# Patient Record
Sex: Female | Born: 1975 | Race: White | Hispanic: No | Smoking: Former smoker
Health system: Southern US, Community
[De-identification: ages and names within clinical notes are randomized; demographics above are authoritative.]

## PROBLEM LIST (undated history)

## (undated) DIAGNOSIS — F32A Depression, unspecified: Secondary | ICD-10-CM

## (undated) DIAGNOSIS — IMO0002 Reserved for concepts with insufficient information to code with codable children: Secondary | ICD-10-CM

## (undated) DIAGNOSIS — J301 Allergic rhinitis due to pollen: Secondary | ICD-10-CM

## (undated) DIAGNOSIS — M5126 Other intervertebral disc displacement, lumbar region: Secondary | ICD-10-CM

## (undated) DIAGNOSIS — K760 Fatty (change of) liver, not elsewhere classified: Secondary | ICD-10-CM

## (undated) DIAGNOSIS — I6529 Occlusion and stenosis of unspecified carotid artery: Secondary | ICD-10-CM

## (undated) DIAGNOSIS — M1990 Unspecified osteoarthritis, unspecified site: Secondary | ICD-10-CM

## (undated) DIAGNOSIS — R569 Unspecified convulsions: Secondary | ICD-10-CM

## (undated) DIAGNOSIS — G8929 Other chronic pain: Secondary | ICD-10-CM

## (undated) DIAGNOSIS — I639 Cerebral infarction, unspecified: Secondary | ICD-10-CM

## (undated) DIAGNOSIS — R112 Nausea with vomiting, unspecified: Secondary | ICD-10-CM

## (undated) DIAGNOSIS — F319 Bipolar disorder, unspecified: Secondary | ICD-10-CM

## (undated) DIAGNOSIS — M549 Dorsalgia, unspecified: Secondary | ICD-10-CM

## (undated) DIAGNOSIS — R768 Other specified abnormal immunological findings in serum: Secondary | ICD-10-CM

## (undated) DIAGNOSIS — F329 Major depressive disorder, single episode, unspecified: Secondary | ICD-10-CM

## (undated) DIAGNOSIS — C9590 Leukemia, unspecified not having achieved remission: Secondary | ICD-10-CM

## (undated) DIAGNOSIS — R7303 Prediabetes: Secondary | ICD-10-CM

## (undated) DIAGNOSIS — E785 Hyperlipidemia, unspecified: Secondary | ICD-10-CM

## (undated) DIAGNOSIS — M797 Fibromyalgia: Secondary | ICD-10-CM

## (undated) DIAGNOSIS — J45909 Unspecified asthma, uncomplicated: Secondary | ICD-10-CM

## (undated) DIAGNOSIS — G629 Polyneuropathy, unspecified: Secondary | ICD-10-CM

## (undated) DIAGNOSIS — C539 Malignant neoplasm of cervix uteri, unspecified: Secondary | ICD-10-CM

## (undated) DIAGNOSIS — Z972 Presence of dental prosthetic device (complete) (partial): Secondary | ICD-10-CM

## (undated) DIAGNOSIS — Z973 Presence of spectacles and contact lenses: Secondary | ICD-10-CM

## (undated) DIAGNOSIS — I219 Acute myocardial infarction, unspecified: Secondary | ICD-10-CM

## (undated) DIAGNOSIS — I319 Disease of pericardium, unspecified: Secondary | ICD-10-CM

## (undated) DIAGNOSIS — Z9889 Other specified postprocedural states: Secondary | ICD-10-CM

## (undated) DIAGNOSIS — S0285XA Fracture of orbit, unspecified, initial encounter for closed fracture: Secondary | ICD-10-CM

## (undated) DIAGNOSIS — K219 Gastro-esophageal reflux disease without esophagitis: Secondary | ICD-10-CM

## (undated) DIAGNOSIS — F419 Anxiety disorder, unspecified: Secondary | ICD-10-CM

## (undated) DIAGNOSIS — M5020 Other cervical disc displacement, unspecified cervical region: Secondary | ICD-10-CM

## (undated) DIAGNOSIS — T148XXA Other injury of unspecified body region, initial encounter: Secondary | ICD-10-CM

## (undated) DIAGNOSIS — G971 Other reaction to spinal and lumbar puncture: Secondary | ICD-10-CM

## (undated) DIAGNOSIS — T884XXA Failed or difficult intubation, initial encounter: Secondary | ICD-10-CM

## (undated) DIAGNOSIS — R9409 Abnormal results of other function studies of central nervous system: Secondary | ICD-10-CM

## (undated) HISTORY — PX: BONE MARROW TRANSPLANT: SHX200

## (undated) HISTORY — DX: Other specified abnormal immunological findings in serum: R76.8

## (undated) HISTORY — DX: Malignant neoplasm of cervix uteri, unspecified: C53.9

## (undated) HISTORY — PX: DILATION AND CURETTAGE OF UTERUS: SHX78

## (undated) HISTORY — DX: Abnormal results of other function studies of central nervous system: R94.09

## (undated) HISTORY — PX: MULTIPLE TOOTH EXTRACTIONS: SHX2053

## (undated) HISTORY — PX: INTRAUTERINE DEVICE INSERTION: SHX323

## (undated) HISTORY — DX: Leukemia, unspecified not having achieved remission: C95.90

## (undated) HISTORY — PX: CHOLECYSTECTOMY: SHX55

## (undated) HISTORY — DX: Allergic rhinitis due to pollen: J30.1

## (undated) HISTORY — PX: NOSE SURGERY: SHX723

---

## 2006-11-01 ENCOUNTER — Emergency Department: Payer: Self-pay | Admitting: Emergency Medicine

## 2006-11-01 ENCOUNTER — Other Ambulatory Visit: Payer: Self-pay

## 2009-06-13 ENCOUNTER — Ambulatory Visit (HOSPITAL_COMMUNITY): Admission: RE | Admit: 2009-06-13 | Discharge: 2009-06-13 | Payer: Self-pay | Admitting: Obstetrics & Gynecology

## 2009-08-26 DIAGNOSIS — S139XXA Sprain of joints and ligaments of unspecified parts of neck, initial encounter: Secondary | ICD-10-CM | POA: Insufficient documentation

## 2009-08-26 DIAGNOSIS — Z9889 Other specified postprocedural states: Secondary | ICD-10-CM | POA: Insufficient documentation

## 2009-08-26 DIAGNOSIS — M542 Cervicalgia: Secondary | ICD-10-CM | POA: Insufficient documentation

## 2009-08-26 DIAGNOSIS — C9190 Lymphoid leukemia, unspecified not having achieved remission: Secondary | ICD-10-CM | POA: Insufficient documentation

## 2009-08-26 DIAGNOSIS — F32A Depression, unspecified: Secondary | ICD-10-CM | POA: Diagnosis present

## 2009-11-11 ENCOUNTER — Inpatient Hospital Stay: Payer: Self-pay | Admitting: Psychiatry

## 2010-02-24 ENCOUNTER — Ambulatory Visit: Payer: Self-pay | Admitting: Surgery

## 2010-02-25 ENCOUNTER — Ambulatory Visit: Payer: Self-pay | Admitting: Surgery

## 2010-03-01 LAB — PATHOLOGY REPORT

## 2010-04-19 HISTORY — PX: BACK SURGERY: SHX140

## 2011-02-13 ENCOUNTER — Emergency Department (HOSPITAL_COMMUNITY)
Admission: EM | Admit: 2011-02-13 | Discharge: 2011-02-13 | Disposition: A | Discharge status: Home / Self Care | Payer: Medicaid Other | Attending: Emergency Medicine | Admitting: Emergency Medicine

## 2011-02-13 ENCOUNTER — Encounter: Payer: Self-pay | Admitting: *Deleted

## 2011-02-13 ENCOUNTER — Emergency Department (HOSPITAL_COMMUNITY): Payer: Medicaid Other

## 2011-02-13 DIAGNOSIS — W010XXA Fall on same level from slipping, tripping and stumbling without subsequent striking against object, initial encounter: Secondary | ICD-10-CM | POA: Insufficient documentation

## 2011-02-13 DIAGNOSIS — F17200 Nicotine dependence, unspecified, uncomplicated: Secondary | ICD-10-CM | POA: Insufficient documentation

## 2011-02-13 DIAGNOSIS — M549 Dorsalgia, unspecified: Secondary | ICD-10-CM | POA: Insufficient documentation

## 2011-02-13 DIAGNOSIS — T07XXXA Unspecified multiple injuries, initial encounter: Secondary | ICD-10-CM | POA: Insufficient documentation

## 2011-02-13 DIAGNOSIS — R071 Chest pain on breathing: Secondary | ICD-10-CM | POA: Insufficient documentation

## 2011-02-13 DIAGNOSIS — Y92009 Unspecified place in unspecified non-institutional (private) residence as the place of occurrence of the external cause: Secondary | ICD-10-CM | POA: Insufficient documentation

## 2011-02-13 HISTORY — DX: Unspecified convulsions: R56.9

## 2011-02-13 MED ORDER — OXYCODONE-ACETAMINOPHEN 5-325 MG PO TABS
1.0000 | ORAL_TABLET | Freq: Once | ORAL | Status: AC
  Administered 2011-02-13: 1 via ORAL
  Filled 2011-02-13: qty 1

## 2011-02-13 MED ORDER — OXYCODONE-ACETAMINOPHEN 5-325 MG PO TABS: 1.0000 | ORAL_TABLET | ORAL | Status: AC | PRN

## 2011-02-13 MED ORDER — DIAZEPAM 10 MG PO TABS: 10.0000 mg | ORAL_TABLET | Freq: Four times a day (QID) | ORAL | Status: AC | PRN

## 2011-02-13 NOTE — ED Notes (Signed)
Pt to xray

## 2011-02-13 NOTE — ED Notes (Signed)
Pt waiting to be seen by provider. NAD. Assisted pt to restroom by wheelchair.
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
## 2011-02-13 NOTE — ED Notes (Signed)
Pt stating she fell in the tub yesterday landing onto her lower right rib area. Pt describes pain in ribs increasing with inspiration no abnormalities palpated, pt stating low back pain after falling and radiating into right sciatic area.

## 2011-02-13 NOTE — ED Notes (Signed)
Pt c/o pain to chest, back, and left hip. Pt states she slipped and fell in her bath tub yesterday.

## 2011-06-02 ENCOUNTER — Encounter (HOSPITAL_COMMUNITY): Payer: Self-pay | Admitting: Emergency Medicine

## 2011-06-02 ENCOUNTER — Emergency Department (HOSPITAL_COMMUNITY)
Admission: EM | Admit: 2011-06-02 | Discharge: 2011-06-02 | Disposition: A | Discharge status: Home / Self Care | Payer: Medicaid Other | Attending: Emergency Medicine | Admitting: Emergency Medicine

## 2011-06-02 DIAGNOSIS — F17200 Nicotine dependence, unspecified, uncomplicated: Secondary | ICD-10-CM | POA: Insufficient documentation

## 2011-06-02 DIAGNOSIS — F341 Dysthymic disorder: Secondary | ICD-10-CM | POA: Insufficient documentation

## 2011-06-02 DIAGNOSIS — G8929 Other chronic pain: Secondary | ICD-10-CM | POA: Insufficient documentation

## 2011-06-02 DIAGNOSIS — M549 Dorsalgia, unspecified: Secondary | ICD-10-CM | POA: Insufficient documentation

## 2011-06-02 HISTORY — DX: Anxiety disorder, unspecified: F41.9

## 2011-06-02 HISTORY — DX: Major depressive disorder, single episode, unspecified: F32.9

## 2011-06-02 HISTORY — DX: Depression, unspecified: F32.A

## 2011-06-02 MED ORDER — ONDANSETRON HCL 4 MG PO TABS
4.0000 mg | ORAL_TABLET | Freq: Once | ORAL | Status: AC
  Administered 2011-06-02: 4 mg via ORAL
  Filled 2011-06-02: qty 1

## 2011-06-02 MED ORDER — METHOCARBAMOL 500 MG PO TABS
1000.0000 mg | ORAL_TABLET | Freq: Once | ORAL | Status: AC
  Administered 2011-06-02: 1000 mg via ORAL
  Filled 2011-06-02: qty 2

## 2011-06-02 MED ORDER — HYDROCODONE-ACETAMINOPHEN 7.5-325 MG PO TABS: 1.0000 | ORAL_TABLET | ORAL | Status: AC | PRN

## 2011-06-02 MED ORDER — DEXAMETHASONE SODIUM PHOSPHATE 4 MG/ML IJ SOLN
8.0000 mg | Freq: Once | INTRAMUSCULAR | Status: AC
  Administered 2011-06-02: 8 mg via INTRAMUSCULAR
  Filled 2011-06-02: qty 2

## 2011-06-02 MED ORDER — FENTANYL CITRATE 0.05 MG/ML IJ SOLN: 50.0000 ug | Freq: Once | INTRAMUSCULAR | Status: DC

## 2011-06-02 MED ORDER — DEXAMETHASONE 6 MG PO TABS: ORAL_TABLET | ORAL | Status: AC

## 2011-06-02 MED ORDER — METHOCARBAMOL 500 MG PO TABS: ORAL_TABLET | ORAL | Status: DC

## 2011-06-02 MED ORDER — FENTANYL CITRATE 0.05 MG/ML IJ SOLN
50.0000 ug | Freq: Once | INTRAMUSCULAR | Status: AC
  Administered 2011-06-02: 20:00:00 via INTRAVENOUS
  Filled 2011-06-02: qty 2
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
## 2011-06-02 NOTE — ED Provider Notes (Signed)
Medical screening examination/treatment/procedure(s) were performed by non-physician practitioner and as supervising physician I was immediately available for consultation/collaboration.  Nicoletta Dress. Colon Branch, MD 06/02/11 2201

## 2011-06-02 NOTE — Discharge Instructions (Signed)
Please see Dr Eduard Clos for assistance with your pain. Check with Dr Phillips Odor [phone] for possible primary care.. Decadron 2 times daily with food. Robaxin three times daily for spasm. Use Norco if needed for pain. Robaxin and Norco may cause drowsiness, use with caution.

## 2011-06-02 NOTE — ED Notes (Signed)
Pain low back and lt leg for 4 mos s/p fall.  Seen here for  Same and xrays done,  And by her md.  Has had PT also without relief.

## 2011-06-02 NOTE — ED Notes (Signed)
Pt c/o intermittent back pain radiating down left leg x several months. Pt states she used a chainsaw last week and pain has been worse ever since.

## 2011-06-03 ENCOUNTER — Ambulatory Visit (HOSPITAL_COMMUNITY)
Admission: RE | Admit: 2011-06-03 | Discharge: 2011-06-03 | Disposition: A | Discharge status: Home / Self Care | Payer: Medicaid Other | Source: Ambulatory Visit | Attending: Nurse Practitioner | Admitting: Nurse Practitioner

## 2011-06-03 ENCOUNTER — Other Ambulatory Visit (HOSPITAL_COMMUNITY): Payer: Self-pay | Admitting: Nurse Practitioner

## 2011-06-03 DIAGNOSIS — M25559 Pain in unspecified hip: Secondary | ICD-10-CM | POA: Insufficient documentation

## 2011-06-03 DIAGNOSIS — M4800 Spinal stenosis, site unspecified: Secondary | ICD-10-CM

## 2011-06-03 DIAGNOSIS — M5126 Other intervertebral disc displacement, lumbar region: Secondary | ICD-10-CM | POA: Insufficient documentation

## 2011-06-03 DIAGNOSIS — M7130 Other bursal cyst, unspecified site: Secondary | ICD-10-CM | POA: Insufficient documentation

## 2011-06-03 DIAGNOSIS — M545 Low back pain, unspecified: Secondary | ICD-10-CM | POA: Insufficient documentation

## 2011-06-03 DIAGNOSIS — M79609 Pain in unspecified limb: Secondary | ICD-10-CM | POA: Insufficient documentation

## 2011-06-17 DIAGNOSIS — M48061 Spinal stenosis, lumbar region without neurogenic claudication: Secondary | ICD-10-CM | POA: Insufficient documentation

## 2011-06-19 ENCOUNTER — Encounter (HOSPITAL_COMMUNITY): Payer: Self-pay | Admitting: *Deleted

## 2011-06-19 ENCOUNTER — Emergency Department (HOSPITAL_COMMUNITY)
Admission: EM | Admit: 2011-06-19 | Discharge: 2011-06-19 | Disposition: A | Discharge status: Home / Self Care | Payer: Medicaid Other | Attending: Emergency Medicine | Admitting: Emergency Medicine

## 2011-06-19 DIAGNOSIS — F341 Dysthymic disorder: Secondary | ICD-10-CM | POA: Insufficient documentation

## 2011-06-19 DIAGNOSIS — M48061 Spinal stenosis, lumbar region without neurogenic claudication: Secondary | ICD-10-CM | POA: Insufficient documentation

## 2011-06-19 DIAGNOSIS — M545 Low back pain, unspecified: Secondary | ICD-10-CM | POA: Insufficient documentation

## 2011-06-19 DIAGNOSIS — Z79899 Other long term (current) drug therapy: Secondary | ICD-10-CM | POA: Insufficient documentation

## 2011-06-19 DIAGNOSIS — G40909 Epilepsy, unspecified, not intractable, without status epilepticus: Secondary | ICD-10-CM | POA: Insufficient documentation

## 2011-06-19 DIAGNOSIS — M5380 Other specified dorsopathies, site unspecified: Secondary | ICD-10-CM | POA: Insufficient documentation

## 2011-06-19 DIAGNOSIS — Z8541 Personal history of malignant neoplasm of cervix uteri: Secondary | ICD-10-CM | POA: Insufficient documentation

## 2011-06-19 DIAGNOSIS — G8929 Other chronic pain: Secondary | ICD-10-CM | POA: Insufficient documentation

## 2011-06-19 HISTORY — DX: Reserved for concepts with insufficient information to code with codable children: IMO0002

## 2011-06-19 HISTORY — DX: Dorsalgia, unspecified: M54.9

## 2011-06-19 HISTORY — DX: Other chronic pain: G89.29

## 2011-06-19 MED ORDER — HYDROMORPHONE HCL PF 1 MG/ML IJ SOLN: 1.0000 mg | Freq: Once | INTRAMUSCULAR | Status: DC

## 2011-06-19 MED ORDER — DEXAMETHASONE 6 MG PO TABS: ORAL_TABLET | ORAL | Status: AC

## 2011-06-19 MED ORDER — METHOCARBAMOL 500 MG PO TABS: ORAL_TABLET | ORAL | Status: DC

## 2011-06-19 MED ORDER — FENTANYL CITRATE 0.05 MG/ML IJ SOLN
50.0000 ug | Freq: Once | INTRAMUSCULAR | Status: AC
  Administered 2011-06-19: 50 ug via INTRAVENOUS
  Filled 2011-06-19: qty 2

## 2011-06-19 MED ORDER — ONDANSETRON HCL 4 MG/2ML IJ SOLN
4.0000 mg | Freq: Once | INTRAMUSCULAR | Status: AC
  Administered 2011-06-19: 4 mg via INTRAVENOUS
  Filled 2011-06-19: qty 2

## 2011-06-19 MED ORDER — DIAZEPAM 5 MG PO TABS
5.0000 mg | ORAL_TABLET | Freq: Once | ORAL | Status: AC
  Administered 2011-06-19: 5 mg via ORAL
  Filled 2011-06-19: qty 1

## 2011-06-19 MED ORDER — HYDROCODONE-ACETAMINOPHEN 7.5-325 MG PO TABS: 1.0000 | ORAL_TABLET | ORAL | Status: AC | PRN

## 2011-06-19 MED ORDER — DEXAMETHASONE SODIUM PHOSPHATE 4 MG/ML IJ SOLN
10.0000 mg | Freq: Once | INTRAMUSCULAR | Status: AC
  Administered 2011-06-19: 10 mg via INTRAMUSCULAR
  Filled 2011-06-19: qty 3

## 2011-06-19 MED ORDER — HYDROMORPHONE HCL PF 1 MG/ML IJ SOLN
1.0000 mg | Freq: Once | INTRAMUSCULAR | Status: AC
  Administered 2011-06-19: 1 mg via INTRAVENOUS
  Filled 2011-06-19: qty 1

## 2011-06-19 MED ORDER — SODIUM CHLORIDE 0.9 % IV SOLN
Freq: Once | INTRAVENOUS | Status: AC
  Administered 2011-06-19: 20:00:00 via INTRAVENOUS

## 2011-06-19 NOTE — ED Notes (Signed)
Pt presents with chronic low back pain x 3 weeks. Pt states she has been seen by MD and had MRI on Monday. Pt lying on side in gurney. Pt visibly in pain. Family with pt. Will continue to monitor.

## 2011-06-19 NOTE — ED Provider Notes (Signed)
History     CSN: [id number]  Arrival date & time 06/19/11  1654   First MD Initiated Contact with Patient 06/19/11 1847      Chief Complaint  Patient presents with  . Back Pain    (Consider location/radiation/quality/duration/timing/severity/associated sxs/prior treatment) Patient is a 36 y.o. female presenting with back pain. The history is provided by the patient.  Back Pain  This is a recurrent problem. The problem occurs hourly. The problem has been gradually worsening. Associated with: 2 herniated disc. The pain is present in the lumbar spine. The quality of the pain is described as aching.    Past Medical History  Diagnosis Date  . Anxiety   . Depression   . Cancer     cervical CA/ Lukemia  . Seizures     last seizure was 8 mo ago.   Marland Kitchen Herniated disc   . Chronic back pain     Past Surgical History  Procedure Date  . Cholecystectomy   . Cesarean section   . Bone marrow transplant     No family history on file.  History  Substance Use Topics  . Smoking status: Current Everyday Smoker -- 0.5 packs/day  . Smokeless tobacco: Not on file  . Alcohol Use: No    OB History    Grav Para Term Preterm Abortions TAB SAB Ect Mult Living                  Review of Systems  Musculoskeletal: Positive for back pain.    Allergies  Review of patient's allergies indicates no known allergies.  Home Medications   Current Outpatient Rx  Name Route Sig Dispense Refill  . CETIRIZINE HCL 10 MG PO TABS Oral Take 10 mg by mouth daily.      Marland Kitchen CLONAZEPAM 0.5 MG PO TABS Oral Take 0.5 mg by mouth 4 (four) times daily.      . DULOXETINE HCL 30 MG PO CPEP Oral Take 30-60 mg by mouth 2 (two) times daily. Take two capsules daily in the morning and one capsule at bedtime    . IBUPROFEN 200 MG PO TABS Oral Take 200 mg by mouth as needed. For pain    . METHOCARBAMOL 500 MG PO TABS  2 tabs po tid for spasm 30 tablet 0  . OXCARBAZEPINE 150 MG PO TABS Oral Take 150 mg by mouth at  bedtime.      Marland Kitchen TEMAZEPAM 30 MG PO CAPS Oral Take 30 mg by mouth at bedtime.        BP 98/57  Pulse 96  Temp(Src) 98.3 F (36.8 C) (Oral)  Resp 22  Ht 5\' 5"  (1.651 m)  Wt 125 lb (56.7 kg)  BMI 20.80 kg/m2  SpO2 98%  LMP 06/02/2011  Physical Exam  Nursing note and vitals reviewed. Constitutional: She is oriented to person, place, and time. She appears well-developed and well-nourished.  Non-toxic appearance.  HENT:  Head: Normocephalic.  Right Ear: Tympanic membrane and external ear normal.  Left Ear: Tympanic membrane and external ear normal.  Eyes: EOM and lids are normal. Pupils are equal, round, and reactive to light.  Neck: Normal range of motion. Neck supple. Carotid bruit is not present.  Cardiovascular: Normal rate, regular rhythm, normal heart sounds, intact distal pulses and normal pulses.   Pulmonary/Chest: Breath sounds normal. No respiratory distress.  Abdominal: Soft. Bowel sounds are normal. There is no tenderness. There is no guarding.  Musculoskeletal: Normal range of motion.  Lumbar area pain to palpation and any movement of the lower back. Palpable spasm.  Lymphadenopathy:       Head (right side): No submandibular adenopathy present.       Head (left side): No submandibular adenopathy present.    She has no cervical adenopathy.  Neurological: She is alert and oriented to person, place, and time. She has normal strength. No cranial nerve deficit or sensory deficit.  Skin: Skin is warm and dry.  Psychiatric: Her speech is normal.       Tearful and anxious.    ED Course  Procedures (including critical care time) Pulse ox 98% on room air. WNL by my interpretation Labs Reviewed - No data to display No results found.   No diagnosis found.    MDM  I have reviewed nursing notes, vital signs, and all appropriate lab and imaging results for this patient. Having problems with low back pain for quite some time. She is seen by a primary care physician in  [place]. She received steroid injections in the back this week and has an MRI this week. She was treated with  Oxycodone, but this medication makes her "talk out of her head", per family. Pt moved to the acute area. Case discussed with Dr Estell Harpin. IV pain meds ordered and IV steroids ordered.  The patient states that the IV fentanyl was not effective in helping her pain. IV Dilaudid given. Pain improved after IV dilaudid. Pt ambulated to bath room with only minimal assistance. Plan is for pt to see her primary MD for re-evaluation of her pain meds. Rx for Norco 7.5, decadron, and Robaxin 500mg  given to pt.      Kathie Dike, [place] 06/19/11 2226

## 2011-06-19 NOTE — ED Notes (Signed)
Pt sitting up, talking on the phone, laughing.  Reporting improvement in pain level.
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
## 2011-06-19 NOTE — ED Notes (Signed)
Pt reports she has 2 herniated discs.  Reports pain has increased in past 2 days.  Reports that she did receive an injection in her back 2 days ago and has not had any relief.  Reports pain shooting down left leg.  Denies numbness in extremities.

## 2011-06-19 NOTE — Discharge Instructions (Signed)
Please apply heating pad to the lower back. Use prescribed medications as suggested. Please see your MD on March 4 for assistance with your pain. Vicodin and Robaxin may cause drowsiness, use with caution.Spinal Stenosis One cause of back pain is spinal stenosis. Stenosis means abnormal narrowing. The spinal canal contains and protects the spinal nerve roots. In spinal stenosis, the spinal canal narrows and pinches the spinal cord and nerves. This causes low back pain and pain in the legs. Stenosis may pinch the nerves that control muscles and sensation in the legs. This leads to pain and abnormal feelings in the leg muscles and areas supplied by those nerves. CAUSES  Spinal stenosis often happens to people as they get older and arthritic boney growths occur in their spinal canal. There is also a loss of the disk height between the bones of the back, which also adds to this problem. Sometimes the problem is present at birth. SYMPTOMS   Pain that is generally worse with activities, particularly standing and walking.   Numbness, tingling, hot or cold feelings, weakness, or a weariness in the legs.   Clumsiness, frequent falling, and a foot-slapping gait, which may come as a result of nerve pressure and muscle weakness.  DIAGNOSIS   Your caregiver may suspect spinal stenosis if you have unusual leg symptoms, such as those previously mentioned.   Your orthopedic surgeon may request special imaging exams, such a computerized magnetic scan (MRI) or computerized X-ray scan (CT) to find out the cause of the problem.  TREATMENT   Sometimes treatments such as postural changes or nonsteroidal anti-inflammatory drugs will relieve the pain.   Nonsteroidal anti-inflammatory medications may help relieve symptoms. These medicines do this by decreasing swelling and inflammation in the nerves.   When stenosis causes severe nerve root compression, conservative treatment may not be enough to maintain a normal  lifestyle. Surgery may be recommended to relieve the pressure on affected nerves. In properly selected patients, the results are very good, and patients are able to continue a normal lifestyle.  HOME CARE INSTRUCTIONS   Flexing the spine by leaning forward while walking may relieve symptoms. Lying with the knees drawn up to the chest may offer some relief. These positions enlarge the space available to the nerves. They may make it easier for stenosis sufferers to walk longer distances.   Rest, followed by gradually resuming activity, also can help.   Aerobic activity, such as bicycling or swimming, is often recommended.   Losing weight can also relieve some of the load on the spine.   Application of warm or cold compresses to the area of pain can be helpful.  SEEK MEDICAL CARE IF:   The periods of relief between episodes of pain become shorter and shorter.   You experience pain that radiates down your leg, even when you are not standing or walking.  SEEK IMMEDIATE MEDICAL CARE IF:   You have a loss of bowel or bladder control.   You have a sudden loss of feeling in your legs.   You suddenly cannot move your legs.  Document Released: 06/26/2003 Document Revised: 12/16/2010 Document Reviewed: 08/21/2009 [facility] Patient Information 2012 [place], [place].

## 2011-06-19 NOTE — ED Notes (Addendum)
Pt states that she has not had any relief from Fentanyl. Pt laying on side, screaming and restless in pain.  Discussed with PA.  Order received for additional pain medication.

## 2011-06-20 NOTE — ED Provider Notes (Signed)
Medical screening examination/treatment/procedure(s) were performed by non-physician practitioner and as supervising physician I was immediately available for consultation/collaboration.   Benny Lennert, MD 06/20/11 1118

## 2011-06-29 HISTORY — PX: BACK SURGERY: SHX140

## 2011-09-08 ENCOUNTER — Encounter (HOSPITAL_COMMUNITY): Payer: Self-pay | Admitting: Oncology

## 2011-09-08 ENCOUNTER — Encounter (HOSPITAL_COMMUNITY): Payer: Medicaid Other | Attending: Oncology | Admitting: Oncology

## 2011-09-08 VITALS — BP 103/72 | HR 86 | Temp 98.7°F | Ht 65.0 in | Wt 117.2 lb

## 2011-09-08 DIAGNOSIS — Z856 Personal history of leukemia: Secondary | ICD-10-CM | POA: Insufficient documentation

## 2011-09-08 DIAGNOSIS — C9102 Acute lymphoblastic leukemia, in relapse: Secondary | ICD-10-CM

## 2011-09-08 DIAGNOSIS — M79609 Pain in unspecified limb: Secondary | ICD-10-CM | POA: Insufficient documentation

## 2011-09-08 DIAGNOSIS — D72829 Elevated white blood cell count, unspecified: Secondary | ICD-10-CM | POA: Insufficient documentation

## 2011-09-08 DIAGNOSIS — F17200 Nicotine dependence, unspecified, uncomplicated: Secondary | ICD-10-CM | POA: Insufficient documentation

## 2011-09-08 NOTE — Progress Notes (Signed)
This office note has been dictated.

## 2011-09-08 NOTE — Patient Instructions (Signed)
Lauren Cross  [id number] 1975-09-01 Dr. Glenford Peers   [facility]  Discharge Instructions  RECOMMENDATIONS MADE BY THE CONSULTANT AND ANY TEST RESULTS WILL BE SENT TO YOUR REFERRING DOCTOR.   EXAM FINDINGS BY MD TODAY AND SIGNS AND SYMPTOMS TO REPORT TO CLINIC OR PRIMARY MD: Exam and discussion per MD.  Will check labs tomorrow to see your status.  MD is unable to feel any lymph nodes or your spleen which is good.  If any problems with your labs we will contact you.  MEDICATIONS PRESCRIBED: none   INSTRUCTIONS GIVEN AND DISCUSSED: Other :  Report any new lumps, increased shortness of breath,etc.  SPECIAL INSTRUCTIONS/FOLLOW-UP: Lab work Needed tomorrow and Return to Clinic on in 1 month to see PA.   I acknowledge that I have been informed and understand all the instructions given to me and received a copy. I do not have any more questions at this time, but understand that I may call the Specialty [facility] at [facility] at [phone] during business hours should I have any further questions or need assistance in obtaining follow-up care.    __________________________________________  _____________  __________ Signature of Patient or Authorized Representative            Date                   Time    __________________________________________ Nurse's Signature

## 2011-09-08 NOTE — Progress Notes (Signed)
CC:   Ninfa Linden, FNP  DIAGNOSES: 1. Leukocytosis. 2. 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 3. History of pregnancies times 2.  She has an 38 year old son who is     alive and in good health and a 27-year-old daughter who has     significant health issues having been born weighing 12 ounces and     10 inches.  HISTORY OF PRESENT ILLNESS:  Lauren Cross also had disk surgery at [facility] in March which went well.  She states, though, she has not felt well for about a year just in general.  She does have night sweats now for 7-8 months and she describes soaking night sweats requiring change of her T-shirt and shorts that she wears occasionally and some time sheets.  She has not really lost weight.  Weight is stable.  No fevers, but she does not take her temperatures.  She basically takes care of her 72-year-old daughter.  She has 5 caregiver's who also come in and help during the week for therapy for her child.  She has not found any lumps anywhere.  She does have some aching in her legs, thighs, and bones of her lower legs ,she states.  She has no blood in her stools, no blood in her urine.  She does smoke 3-4 cigarettes a day but has been as high as a pack a day and started smoking about 15 years ago.  She was also treated for cervical dysplasia with what sounds like cryotherapy first and then conization surgery.  She did not breastfeed her children, but she started having mammography sometime in her 73s and she thinks that was at the recommendation of the doctors at [facility].  She saw Dr. Providence Crosby there, who is now retired, about 3 years ago.  She saw him last at that point in time and I do not think she has been back since, however.  She does not use drugs.  PHYSICAL EXAMINATION:  Vital Signs:  She is afebrile.  She is  certainly in no acute distress.  Weight is 117 pounds.  5 feet 5 inches tall.  She is thin.  Blood pressure 103/72, right arm, sitting position.  Pulse 80 and regular.  Respirations 16 and unlabored.  She describes a pain score of 8 basically in her legs.  Abdomen:  Soft and nontender without hepatosplenomegaly.  She also has pain in the left upper quadrant of the abdomen she states for the last couple months.  Nothing brings it on in particular.  She is not having nausea or vomiting.  Lungs:  Diminished breath sounds throughout, especially in the upper lobes.  Breast:  Exam is negative for any masses.  She has fibrocystic changes.  Heart: Regular rhythm and rate without murmur, rub, or gallop.  She is not in acute distress.  HEENT:  Teeth are in good repair.  Throat is clear. Pupils equally round and reactive to light.  She has normal facial symmetry.  She is alert and oriented.  She has no peripheral edema. Pulses 1 to 2+ and symmetrical.  I would like to get some baseline blood work on her, especially CBC and differential, and  peripheral blood for flow cytometry to begin with. If there is any question about what is going on, we may have to follow this up  with a bone marrow aspirate and biopsy.  We will see her back one way or the other in a few weeks.    ______________________________ Ladona Horns. Mariel Sleet, MD ESN/MEDQ  D:  09/08/2011  T:  09/08/2011  Job:  [id number]

## 2011-09-09 ENCOUNTER — Encounter (HOSPITAL_COMMUNITY): Payer: Medicaid Other

## 2011-09-09 DIAGNOSIS — C9102 Acute lymphoblastic leukemia, in relapse: Secondary | ICD-10-CM

## 2011-09-09 LAB — COMPREHENSIVE METABOLIC PANEL
Alkaline Phosphatase: 89 U/L (ref 39–117)
BUN: 5 mg/dL — ABNORMAL LOW (ref 6–23)
CO2: 27 mEq/L (ref 19–32)
Chloride: 106 mEq/L (ref 96–112)
Creatinine, Ser: 0.7 mg/dL (ref 0.50–1.10)
GFR calc Af Amer: >90 mL/min (ref >90)
GFR calc non Af Amer: >90 mL/min (ref >90)
Glucose, Bld: 82 mg/dL (ref 70–99)
Potassium: 3.2 mEq/L — ABNORMAL LOW (ref 3.5–5.1)
Total Bilirubin: 0.3 mg/dL (ref 0.3–1.2)

## 2011-09-09 LAB — DIFFERENTIAL
Basophils Relative: 0 % (ref 0–1)
Lymphs Abs: 2.1 10*3/uL (ref 0.7–4.0)
Monocytes Absolute: 0.9 10*3/uL (ref 0.1–1.0)
Monocytes Relative: 5 % (ref 3–12)
Neutro Abs: 15.6 10*3/uL — ABNORMAL HIGH (ref 1.7–7.7)
Neutrophils Relative %: 81 % — ABNORMAL HIGH (ref 43–77)

## 2011-09-09 LAB — CBC
HCT: 41.8 % (ref 36.0–46.0)
Hemoglobin: 13.6 g/dL (ref 12.0–15.0)
MCHC: 32.5 g/dL (ref 30.0–36.0)
RBC: 4.35 MIL/uL (ref 3.87–5.11)

## 2011-09-09 LAB — LIPID PANEL
Cholesterol: 211 mg/dL — ABNORMAL HIGH (ref 0–200)
Triglycerides: 200 mg/dL — ABNORMAL HIGH (ref <150)
VLDL: 40 mg/dL (ref 0–40)

## 2011-09-30 ENCOUNTER — Emergency Department (HOSPITAL_COMMUNITY)
Admission: EM | Admit: 2011-09-30 | Discharge: 2011-09-30 | Disposition: A | Discharge status: Home / Self Care | Payer: Medicaid Other | Attending: Emergency Medicine | Admitting: Emergency Medicine

## 2011-09-30 ENCOUNTER — Encounter (HOSPITAL_COMMUNITY): Payer: Self-pay

## 2011-09-30 DIAGNOSIS — Z856 Personal history of leukemia: Secondary | ICD-10-CM | POA: Insufficient documentation

## 2011-09-30 DIAGNOSIS — R197 Diarrhea, unspecified: Secondary | ICD-10-CM | POA: Insufficient documentation

## 2011-09-30 DIAGNOSIS — Z8541 Personal history of malignant neoplasm of cervix uteri: Secondary | ICD-10-CM | POA: Insufficient documentation

## 2011-09-30 DIAGNOSIS — F329 Major depressive disorder, single episode, unspecified: Secondary | ICD-10-CM | POA: Insufficient documentation

## 2011-09-30 DIAGNOSIS — R51 Headache: Secondary | ICD-10-CM | POA: Insufficient documentation

## 2011-09-30 DIAGNOSIS — K529 Noninfective gastroenteritis and colitis, unspecified: Secondary | ICD-10-CM

## 2011-09-30 DIAGNOSIS — F3289 Other specified depressive episodes: Secondary | ICD-10-CM | POA: Insufficient documentation

## 2011-09-30 DIAGNOSIS — G8929 Other chronic pain: Secondary | ICD-10-CM | POA: Insufficient documentation

## 2011-09-30 DIAGNOSIS — R079 Chest pain, unspecified: Secondary | ICD-10-CM | POA: Insufficient documentation

## 2011-09-30 DIAGNOSIS — F17200 Nicotine dependence, unspecified, uncomplicated: Secondary | ICD-10-CM | POA: Insufficient documentation

## 2011-09-30 DIAGNOSIS — F411 Generalized anxiety disorder: Secondary | ICD-10-CM | POA: Insufficient documentation

## 2011-09-30 DIAGNOSIS — R112 Nausea with vomiting, unspecified: Secondary | ICD-10-CM | POA: Insufficient documentation

## 2011-09-30 LAB — COMPREHENSIVE METABOLIC PANEL
ALT: 11 U/L (ref 0–35)
AST: 18 U/L (ref 0–37)
Albumin: 3.4 g/dL — ABNORMAL LOW (ref 3.5–5.2)
CO2: 22 mEq/L (ref 19–32)
Calcium: 9.2 mg/dL (ref 8.4–10.5)
Creatinine, Ser: 0.52 mg/dL (ref 0.50–1.10)
Sodium: 137 mEq/L (ref 135–145)

## 2011-09-30 LAB — CBC
HCT: 41.1 % (ref 36.0–46.0)
Hemoglobin: 13.8 g/dL (ref 12.0–15.0)
MCH: 30.7 pg (ref 26.0–34.0)
MCHC: 33.6 g/dL (ref 30.0–36.0)
MCV: 91.3 fL (ref 78.0–100.0)
RBC: 4.5 MIL/uL (ref 3.87–5.11)

## 2011-09-30 LAB — URINALYSIS, ROUTINE W REFLEX MICROSCOPIC
Bilirubin Urine: NEGATIVE
Glucose, UA: NEGATIVE mg/dL
Ketones, ur: 40 mg/dL — AB
Leukocytes, UA: NEGATIVE
pH: 6.5 (ref 5.0–8.0)

## 2011-09-30 LAB — DIFFERENTIAL
Basophils Relative: 0 % (ref 0–1)
Eosinophils Absolute: 0 10*3/uL (ref 0.0–0.7)
Eosinophils Relative: 0 % (ref 0–5)
Lymphs Abs: 2.4 10*3/uL (ref 0.7–4.0)
Monocytes Absolute: 0.7 10*3/uL (ref 0.1–1.0)
Monocytes Relative: 6 % (ref 3–12)

## 2011-09-30 LAB — URINE MICROSCOPIC-ADD ON

## 2011-09-30 MED ORDER — SODIUM CHLORIDE 0.9 % IV BOLUS (SEPSIS)
1000.0000 mL | Freq: Once | INTRAVENOUS | Status: AC
  Administered 2011-09-30: 1000 mL via INTRAVENOUS

## 2011-09-30 MED ORDER — ONDANSETRON HCL 4 MG/2ML IJ SOLN
4.0000 mg | Freq: Once | INTRAMUSCULAR | Status: AC
  Administered 2011-09-30: 4 mg via INTRAVENOUS
  Filled 2011-09-30: qty 2

## 2011-09-30 MED ORDER — PANTOPRAZOLE SODIUM 40 MG IV SOLR
40.0000 mg | Freq: Once | INTRAVENOUS | Status: AC
  Administered 2011-09-30: 40 mg via INTRAVENOUS
  Filled 2011-09-30: qty 40

## 2011-09-30 MED ORDER — DIPHENOXYLATE-ATROPINE 2.5-0.025 MG PO TABS: 1.0000 | ORAL_TABLET | Freq: Four times a day (QID) | ORAL | Status: AC | PRN

## 2011-09-30 MED ORDER — ONDANSETRON 4 MG PO TBDP
4.0000 mg | ORAL_TABLET | Freq: Once | ORAL | Status: AC
  Administered 2011-09-30: 4 mg via ORAL
  Filled 2011-09-30 (×2): qty 1

## 2011-09-30 MED ORDER — ONDANSETRON HCL 8 MG PO TABS: 8.0000 mg | ORAL_TABLET | ORAL | Status: AC | PRN

## 2011-09-30 MED ORDER — LORAZEPAM 2 MG/ML IJ SOLN
1.0000 mg | Freq: Once | INTRAMUSCULAR | Status: AC
  Administered 2011-09-30: 1 mg via INTRAVENOUS
  Filled 2011-09-30: qty 1

## 2011-09-30 MED ORDER — LORAZEPAM 2 MG/ML IJ SOLN
1.0000 mg | Freq: Once | INTRAMUSCULAR | Status: AC
  Administered 2011-09-30: 2 mg via INTRAVENOUS
  Filled 2011-09-30: qty 1

## 2011-09-30 MED ORDER — KETOROLAC TROMETHAMINE 30 MG/ML IJ SOLN
30.0000 mg | Freq: Once | INTRAMUSCULAR | Status: AC
  Administered 2011-09-30: 30 mg via INTRAVENOUS
  Filled 2011-09-30: qty 1

## 2011-09-30 MED ORDER — LORAZEPAM 1 MG PO TABS: 1.0000 mg | ORAL_TABLET | Freq: Three times a day (TID) | ORAL | Status: AC | PRN

## 2011-09-30 NOTE — ED Notes (Signed)
Pt here with multiple complaints. Pt c/o vomiting/diarrhea since Sunday along with headache. Pt seeing Dr. Jerelyn Scott for ALL which pt has been in remission for 1 year. Pt tearful in triage.

## 2011-09-30 NOTE — ED Provider Notes (Signed)
History   This chart was scribed for Lauren Hutching, MD by Clarita Crane. The patient was seen in room APA12/APA12. Patient's care was started at 1157.    CSN: [id number]  Arrival date & time 09/30/11  1157   First MD Initiated Contact with Patient 09/30/11 1430      Chief Complaint  Patient presents with  . Headache  . Diarrhea  . Emesis    (Consider location/radiation/quality/duration/timing/severity/associated sxs/prior treatment) HPI Lauren Cross is a 36 y.o. female who presents to the Emergency Department complaining of constant moderate to severe nausea, vomiting and diarrhea with associated HA and chest discomfort onset 3 days ago and persistent since. Denies SOB, palpitations, fever, chills, cough. Patient notes she started use of potassium supplements 3 weeks ago after dx with hypokalemia. Notes white blood cell count has been elevated for the past several months for which she is being followed by Dr. Mariel Sleet but has not been able to see Dr. Mariel Sleet recently. Patient with h/o ALL Leukemia which is currently in remission.  Neijstrom- Oncologist  Past Medical History  Diagnosis Date  . Anxiety   . Depression   . Cancer     cervical CA/ Lukemia  . Seizures     last seizure was 8 mo ago.   Marland Kitchen Herniated disc   . Chronic back pain     Past Surgical History  Procedure Date  . Cholecystectomy   . Cesarean section   . Bone marrow transplant   . Back surgery 06/29/11    L4-5 microdiskectomy  . Intrauterine device insertion     Family History  Problem Relation Age of Onset  . Diabetes Mother   . Cancer Mother     breast cancer  . Melanoma Father   . Diabetes Maternal Grandfather   . Stroke Maternal Grandfather     History  Substance Use Topics  . Smoking status: Current Everyday Smoker -- 0.2 packs/day  . Smokeless tobacco: Never Used  . Alcohol Use: No    OB History    Grav Para Term Preterm Abortions TAB SAB Ect Mult Living                  Review of  Systems A complete 10 system review of systems was obtained and all systems are negative except as noted in the HPI and PMH.   Allergies  Oxycodone  Home Medications   Current Outpatient Rx  Name Route Sig Dispense Refill  . CETIRIZINE HCL 10 MG PO TABS Oral Take 10 mg by mouth daily.      Marland Kitchen CLONAZEPAM 1 MG PO TABS Oral Take 1 mg by mouth 3 (three) times daily as needed. For anxiety    . DULOXETINE HCL 30 MG PO CPEP Oral Take 30 mg by mouth 2 (two) times daily. Take 2 capsules in am and 1 capsule in pm    . HYDROCODONE-ACETAMINOPHEN 7.5-325 MG PO TABS Oral Take 1 tablet by mouth every 8 (eight) hours as needed. For pain    . IBUPROFEN 200 MG PO TABS Oral Take 200 mg by mouth as needed. For pain    . LEVETIRACETAM 750 MG PO TABS Oral Take 750 mg by mouth every 12 (twelve) hours.    . OXCARBAZEPINE 150 MG PO TABS Oral Take 150 mg by mouth at bedtime.      Marland Kitchen POTASSIUM CHLORIDE CRYS ER 10 MEQ PO TBCR Oral Take 10 mEq by mouth daily.    Marland Kitchen TEMAZEPAM 30 MG PO CAPS  Oral Take 30 mg by mouth at bedtime as needed. For sleep    . TRAZODONE HCL 100 MG PO TABS Oral Take 100 mg by mouth at bedtime as needed. For sleep    . RANITIDINE HCL 75 MG PO TABS Oral Take 75 mg by mouth at bedtime.      BP 102/68  Pulse 92  Temp 98.8 F (37.1 C) (Oral)  Resp 16  Ht 5\' 5"  (1.651 m)  Wt 123 lb (55.792 kg)  BMI 20.47 kg/m2  SpO2 99%  Physical Exam  Nursing note and vitals reviewed. Constitutional: She is oriented to person, place, and time. She appears well-developed and well-nourished. No distress.  HENT:  Head: Normocephalic and atraumatic.  Eyes: EOM are normal. Pupils are equal, round, and reactive to light.  Neck: Neck supple. No tracheal deviation present.  Cardiovascular: Normal rate and regular rhythm.  Exam reveals no gallop and no friction rub.   No murmur heard. Pulmonary/Chest: Effort normal. No respiratory distress.  Abdominal: Soft. She exhibits no distension.  Musculoskeletal: Normal  range of motion. She exhibits no edema.  Neurological: She is alert and oriented to person, place, and time. No sensory deficit.  Skin: Skin is warm and dry.  Psychiatric: She has a normal mood and affect. Her behavior is normal.    ED Course  Procedures (including critical care time)  DIAGNOSTIC STUDIES: Oxygen Saturation is 100% on room air, normal by my interpretation.    COORDINATION OF CARE: 2:50PM-IVFs, antiemetics, blood labs, protonix, zofran, ativan to be obtained and administered.   Results for orders placed during the hospital encounter of 09/30/11  COMPREHENSIVE METABOLIC PANEL      Component Value Range   Sodium 137  135 - 145 mEq/L   Potassium 3.5  3.5 - 5.1 mEq/L   Chloride 102  96 - 112 mEq/L   CO2 22  19 - 32 mEq/L   Glucose, Bld 85  70 - 99 mg/dL   BUN 7  6 - 23 mg/dL   Creatinine, Ser 1.61  0.50 - 1.10 mg/dL   Calcium 9.2  8.4 - 09.6 mg/dL   Total Protein 7.0  6.0 - 8.3 g/dL   Albumin 3.4 (*) 3.5 - 5.2 g/dL   AST 18  0 - 37 U/L   ALT 11  0 - 35 U/L   Alkaline Phosphatase 95  39 - 117 U/L   Total Bilirubin 0.5  0.3 - 1.2 mg/dL   GFR calc non Af Amer >90  >90 mL/min   GFR calc Af Amer >90  >90 mL/min  CBC      Component Value Range   WBC 11.7 (*) 4.0 - 10.5 K/uL   RBC 4.50  3.87 - 5.11 MIL/uL   Hemoglobin 13.8  12.0 - 15.0 g/dL   HCT 04.5  40.9 - 81.1 %   MCV 91.3  78.0 - 100.0 fL   MCH 30.7  26.0 - 34.0 pg   MCHC 33.6  30.0 - 36.0 g/dL   RDW 91.4  78.2 - 95.6 %   Platelets 500 (*) 150 - 400 K/uL  DIFFERENTIAL      Component Value Range   Neutrophils Relative 73  43 - 77 %   Neutro Abs 8.6 (*) 1.7 - 7.7 K/uL   Lymphocytes Relative 20  12 - 46 %   Lymphs Abs 2.4  0.7 - 4.0 K/uL   Monocytes Relative 6  3 - 12 %   Monocytes Absolute 0.7  0.1 - 1.0 K/uL   Eosinophils Relative 0  0 - 5 %   Eosinophils Absolute 0.0  0.0 - 0.7 K/uL   Basophils Relative 0  0 - 1 %   Basophils Absolute 0.0  0.0 - 0.1 K/uL  LIPASE, BLOOD      Component Value Range    Lipase 24  11 - 59 U/L  URINALYSIS, ROUTINE W REFLEX MICROSCOPIC      Component Value Range   Color, Urine YELLOW  YELLOW   APPearance CLEAR  CLEAR   Specific Gravity, Urine 1.015  1.005 - 1.030   pH 6.5  5.0 - 8.0   Glucose, UA NEGATIVE  NEGATIVE mg/dL   Hgb urine dipstick MODERATE (*) NEGATIVE   Bilirubin Urine NEGATIVE  NEGATIVE   Ketones, ur 40 (*) NEGATIVE mg/dL   Protein, ur NEGATIVE  NEGATIVE mg/dL   Urobilinogen, UA 0.2  0.0 - 1.0 mg/dL   Nitrite NEGATIVE  NEGATIVE   Leukocytes, UA NEGATIVE  NEGATIVE  URINE MICROSCOPIC-ADD ON      Component Value Range   Squamous Epithelial / LPF FEW (*) RARE   WBC, UA 0-2  <3 WBC/hpf   RBC / HPF 3-6  <3 RBC/hpf   Bacteria, UA RARE  RARE   Labs Reviewed - No data to display No results found.   No diagnosis found.   Date: 09/30/2011  Rate: 91  Rhythm: normal sinus rhythm  QRS Axis: normal  Intervals: normal  ST/T Wave abnormalities: normal  Conduction Disutrbances: none  Narrative Interpretation: unremarkable     MDM  Recheck at d/c:  Feeling better.  No acute abd      Add scribe attestation statement}I personally performed the services described in this documentation, which was scribed in my presence. The recorded information has been reviewed and considered.   Lauren Hutching, MD 09/30/11 [phone]

## 2011-09-30 NOTE — ED Notes (Signed)
PT with recent lower back surgery at [facility] in March, pt states that her [facility] have been elevated and sees Dr. Demetrios Isaacs for ALL, pt states last seen in May and not able to get answers why WBC's are elevated, also c/o diarrhea and N/V since past Sunday, denies any pain

## 2011-09-30 NOTE — Discharge Instructions (Signed)
White blood cell count was 11.7.  meds for nausea, diarrhea, anxiety.  Clear liquids.  F/u your dr in in aZaditor drops

## 2011-09-30 NOTE — ED Notes (Signed)
Pt ambulated to room without difficultly

## 2011-10-04 ENCOUNTER — Encounter (HOSPITAL_COMMUNITY): Payer: Medicaid Other | Attending: Oncology

## 2011-10-04 DIAGNOSIS — M79609 Pain in unspecified limb: Secondary | ICD-10-CM | POA: Insufficient documentation

## 2011-10-04 DIAGNOSIS — D72829 Elevated white blood cell count, unspecified: Secondary | ICD-10-CM | POA: Insufficient documentation

## 2011-10-04 DIAGNOSIS — Z856 Personal history of leukemia: Secondary | ICD-10-CM | POA: Insufficient documentation

## 2011-10-04 DIAGNOSIS — F17200 Nicotine dependence, unspecified, uncomplicated: Secondary | ICD-10-CM | POA: Insufficient documentation

## 2011-10-04 NOTE — Progress Notes (Signed)
Labs drawn today for flow cytomerty

## 2011-10-06 ENCOUNTER — Ambulatory Visit (HOSPITAL_COMMUNITY): Payer: Medicaid Other | Admitting: Oncology

## 2011-10-06 ENCOUNTER — Encounter (HOSPITAL_COMMUNITY): Payer: Self-pay | Admitting: Oncology

## 2011-10-11 ENCOUNTER — Ambulatory Visit (HOSPITAL_COMMUNITY): Payer: Medicaid Other | Admitting: Oncology

## 2011-11-17 ENCOUNTER — Other Ambulatory Visit (HOSPITAL_COMMUNITY): Payer: Self-pay | Admitting: Nurse Practitioner

## 2011-11-17 ENCOUNTER — Ambulatory Visit (HOSPITAL_COMMUNITY)
Admission: RE | Admit: 2011-11-17 | Discharge: 2011-11-17 | Disposition: A | Discharge status: Home / Self Care | Payer: Medicaid Other | Source: Ambulatory Visit | Attending: Nurse Practitioner | Admitting: Nurse Practitioner

## 2011-11-17 DIAGNOSIS — M25559 Pain in unspecified hip: Secondary | ICD-10-CM

## 2012-01-31 ENCOUNTER — Ambulatory Visit (INDEPENDENT_AMBULATORY_CARE_PROVIDER_SITE_OTHER): Payer: Medicaid Other | Admitting: Orthopedic Surgery

## 2012-01-31 ENCOUNTER — Encounter: Payer: Self-pay | Admitting: Orthopedic Surgery

## 2012-01-31 ENCOUNTER — Ambulatory Visit (INDEPENDENT_AMBULATORY_CARE_PROVIDER_SITE_OTHER): Payer: Medicaid Other

## 2012-01-31 VITALS — BP 90/60 | Ht 65.0 in | Wt 113.0 lb

## 2012-01-31 DIAGNOSIS — M25579 Pain in unspecified ankle and joints of unspecified foot: Secondary | ICD-10-CM

## 2012-01-31 DIAGNOSIS — M25572 Pain in left ankle and joints of left foot: Secondary | ICD-10-CM | POA: Insufficient documentation

## 2012-01-31 DIAGNOSIS — S93409A Sprain of unspecified ligament of unspecified ankle, initial encounter: Secondary | ICD-10-CM | POA: Insufficient documentation

## 2012-01-31 NOTE — Patient Instructions (Signed)
Ankle sprain  Ankle Pain Ankle pain is a common symptom. The bones, cartilage, tendons, and muscles of the ankle joint perform a lot of work each day. The ankle joint holds your body weight and allows you to move around. Ankle pain can occur on either side or back of 1 or both ankles. Ankle pain may be sharp and burning or dull and aching. There may be tenderness, stiffness, redness, or warmth around the ankle. The pain occurs more often when a person walks or puts pressure on the ankle. CAUSES   There are many reasons ankle pain can develop. It is important to work with your caregiver to identify the cause since many conditions can impact the bones, cartilage, muscles, and tendons. Causes for ankle pain include:  Injury, including a break (fracture), sprain, or strain often due to a fall, sports, or a high-impact activity.   Swelling (inflammation) of a tendon (tendonitis).   Achilles tendon rupture.   Ankle instability after repeated sprains and strains.   Poor foot alignment.   Pressure on a nerve (tarsal tunnel syndrome).   Arthritis in the ankle or the lining of the ankle.   Crystal formation in the ankle (gout or pseudogout).  DIAGNOSIS   A diagnosis is based on your medical history, your symptoms, results of your physical exam, and results of diagnostic tests. Diagnostic tests may include X-ray exams or a computerized magnetic scan (magnetic resonance imaging, MRI). TREATMENT   Treatment will depend on the cause of your ankle pain and may include:  Keeping pressure off the ankle and limiting activities.   Using crutches or other walking support (a cane or brace).   Using rest, ice, compression, and elevation.   Participating in physical therapy or home exercises.   Wearing shoe inserts or special shoes.   Losing weight.   Taking medications to reduce pain or swelling or receiving an injection.   Undergoing surgery.  HOME CARE INSTRUCTIONS    Only take  over-the-counter or prescription medicines for pain, discomfort, or fever as directed by your caregiver.   Put ice on the injured area.   Put ice in a plastic bag.   Place a towel between your skin and the bag.   Leave the ice on for 15 to 20 minutes at a time, 3 to 4 times a day.   Keep your leg raised (elevated) when possible to lessen swelling.   Avoid activities that cause ankle pain.   Follow specific exercises as directed by your caregiver.   Record how often you have ankle pain, the location of the pain, and what it feels like. This information may be helpful to you and your caregiver.   Ask your caregiver about returning to work or sports and whether you should drive.   Follow up with your caregiver for further examination, therapy, or testing as directed.  SEEK MEDICAL CARE IF:    Pain or swelling continues or worsens beyond 1 week.   You have an oral temperature above 102 F (38.9 C).   You are feeling unwell or have chills.   You are having an increasingly difficult time with walking.   You have loss of sensation or other new symptoms.   You have questions or concerns.  MAKE SURE YOU:    Understand these instructions.   Will watch your condition.   Will get help right away if you are not doing well or get worse.  Document Released: 09/23/2009 Document Revised: 06/28/2011 Document Reviewed: 09/23/2009  [employer] Patient Information 2013 [place], [place].   Ankle Sprain An ankle sprain is an injury to the strong, fibrous tissues (ligaments) that hold the bones of your ankle joint together.   CAUSES An ankle sprain is usually caused by a fall or by twisting your ankle. Ankle sprains most commonly occur when you step on the outer edge of your foot, and your ankle turns inward. People who participate in sports are more prone to these types of injuries.   SYMPTOMS    Pain in your ankle. The pain may be present at rest or only when you are trying to stand or walk.     Swelling.   Bruising. Bruising may develop immediately or within 1 to 2 days after your injury.   Difficulty standing or walking, particularly when turning corners or changing directions.  DIAGNOSIS   Your caregiver will ask you details about your injury and perform a physical exam of your ankle to determine if you have an ankle sprain. During the physical exam, your caregiver will press on and apply pressure to specific areas of your foot and ankle. Your caregiver will try to move your ankle in certain ways. An X-ray exam may be done to be sure a bone was not broken or a ligament did not separate from one of the bones in your ankle (avulsion fracture).   TREATMENT   Certain types of braces can help stabilize your ankle. Your caregiver can make a recommendation for this. Your caregiver may recommend the use of medicine for pain. If your sprain is severe, your caregiver may refer you to a surgeon who helps to restore function to parts of your skeletal system (orthopedist) or a physical therapist. HOME CARE INSTRUCTIONS    Apply ice to your injury for 1 to 2 days or as directed by your caregiver. Applying ice helps to reduce inflammation and pain.   Put ice in a plastic bag.   Place a towel between your skin and the bag.   Leave the ice on for 15 to 20 minutes at a time, every 2 hours while you are awake.   Only take over-the-counter or prescription medicines for pain, discomfort, or fever as directed by your caregiver.   Keep your injured leg elevated, when possible, to lessen swelling.   If your caregiver recommends crutches, use them as instructed. Gradually put weight on the affected ankle. Continue to use crutches or a cane until you can walk without feeling pain in your ankle.   If you have a plaster splint, wear the splint as directed by your caregiver. Do not rest it on anything harder than a pillow for the first 24 hours. Do not put weight on it. Do not get it wet. You may take it  off to take a shower or bath.   You may have been given an elastic bandage to wear around your ankle to provide support. If the elastic bandage is too tight (you have numbness or tingling in your foot or your foot becomes cold and blue), adjust the bandage to make it comfortable.   If you have an air splint, you may blow more air into it or let air out to make it more comfortable. You may take your splint off at night and before taking a shower or bath.   Wiggle your toes in the splint several times per day to decrease swelling.  SEEK MEDICAL CARE IF:    You have an increase in bruising, swelling, or pain.  Your toes feel extremely cold or you lose feeling in your foot.   Your pain is not relieved with medicine.  SEEK IMMEDIATE MEDICAL CARE IF:  Your toes are numb or blue.   You have severe pain.  MAKE SURE YOU:    Understand these instructions.   Will watch your condition.   Will get help right away if you are not doing well or get worse.  Document Released: 04/05/2005 Document Revised: 06/28/2011 Document Reviewed: 04/17/2011 [facility] Patient Information 2013 [place], [place].

## 2012-01-31 NOTE — Progress Notes (Signed)
Patient ID: Lauren Cross, female   DOB: [birth year], 36 y.o.   MRN: [id number] Chief Complaint  Patient presents with  . Ankle Injury    left ankle injury (fell out of chair and rolled ankle), DOI approx OCT 2   Consult   The patient fell off of a table or chair and landed on her left side sustaining significant and extensive bruising on the left side in twisted her turned her foot and complains of pain inability to weight-bear which includes pain in the foot ankle and calf and also in the left hip. No x-rays were obtained at that time.  She comes in with severe pain walking on her tiptoes and stiffness in her  Review of systems as recorded  Past Medical History  Diagnosis Date  . Anxiety   . Depression   . Cancer     cervical CA/ Lukemia  . Seizures     last seizure was 8 mo ago.   Marland Kitchen Herniated disc   . Chronic back pain   . Lupus     Past Surgical History  Procedure Date  . Cholecystectomy   . Cesarean section   . Bone marrow transplant   . Back surgery 06/29/11    L4-5 microdiskectomy  . Intrauterine device insertion   . Nose surgery     BP 90/60  Ht 5\' 5"  (1.651 m)  Wt 113 lb (51.256 kg)  BMI 18.80 kg/m2 She is a well-developed well-nourished thin female oriented x3 normal mood ambulates with assistance from her significant other walking on her tiptoes not to weight-bear  On inspection there is no swelling in the ankle joint there is tenderness in the calf and the lateral collateral ankle ligaments Achilles tendon is intact passive range of motion knee and ankle normal she has a trace amount of laxity in her anterior talofibular ligament. Strength assessment is unreliable based on the patient's tolerance of discomfort which seems out of proportion to the injuries although she has progressed from the primary care physician showing extensive bruising on the left side scans intact pulses are normal temperature is normal no edema in the ankle sensation normal no pathologic  reflexes. Balance not tested.  X-rays negative  Impression ankle sprain  Recommend anti-inflammatories Lodine 300 mg 3 times a day, physical therapy. She was given a prescription to get a brace for her ankle, I gave her 3 options. She has Medicaid cannot afford the braces that are in our office with cash payment.

## 2012-02-15 ENCOUNTER — Telehealth: Payer: Self-pay | Admitting: Orthopedic Surgery

## 2012-02-15 NOTE — Telephone Encounter (Signed)
Received notification per Clearn[name] physical therapy department, ph# [phone], as follows, per email:  I wanted to let you know that I have been trying to reach Lauren Cross (DOB: 1976-01-24) since October 15 and have left 3 messages and she hasnt returned any of our calls.  [employer]

## 2012-03-04 ENCOUNTER — Emergency Department (HOSPITAL_COMMUNITY)
Admission: EM | Admit: 2012-03-04 | Discharge: 2012-03-04 | Disposition: A | Discharge status: Home / Self Care | Payer: Medicaid Other | Attending: Emergency Medicine | Admitting: Emergency Medicine

## 2012-03-04 ENCOUNTER — Emergency Department (HOSPITAL_COMMUNITY): Payer: Medicaid Other

## 2012-03-04 ENCOUNTER — Encounter (HOSPITAL_COMMUNITY): Payer: Self-pay

## 2012-03-04 DIAGNOSIS — F411 Generalized anxiety disorder: Secondary | ICD-10-CM | POA: Insufficient documentation

## 2012-03-04 DIAGNOSIS — W010XXA Fall on same level from slipping, tripping and stumbling without subsequent striking against object, initial encounter: Secondary | ICD-10-CM | POA: Insufficient documentation

## 2012-03-04 DIAGNOSIS — S0993XA Unspecified injury of face, initial encounter: Secondary | ICD-10-CM | POA: Insufficient documentation

## 2012-03-04 DIAGNOSIS — S79919A Unspecified injury of unspecified hip, initial encounter: Secondary | ICD-10-CM | POA: Insufficient documentation

## 2012-03-04 DIAGNOSIS — Z79899 Other long term (current) drug therapy: Secondary | ICD-10-CM | POA: Insufficient documentation

## 2012-03-04 DIAGNOSIS — Z8739 Personal history of other diseases of the musculoskeletal system and connective tissue: Secondary | ICD-10-CM | POA: Insufficient documentation

## 2012-03-04 DIAGNOSIS — G8929 Other chronic pain: Secondary | ICD-10-CM | POA: Insufficient documentation

## 2012-03-04 DIAGNOSIS — Y93E1 Activity, personal bathing and showering: Secondary | ICD-10-CM | POA: Insufficient documentation

## 2012-03-04 DIAGNOSIS — F17200 Nicotine dependence, unspecified, uncomplicated: Secondary | ICD-10-CM | POA: Insufficient documentation

## 2012-03-04 DIAGNOSIS — S0990XA Unspecified injury of head, initial encounter: Secondary | ICD-10-CM

## 2012-03-04 DIAGNOSIS — M25552 Pain in left hip: Secondary | ICD-10-CM

## 2012-03-04 DIAGNOSIS — C539 Malignant neoplasm of cervix uteri, unspecified: Secondary | ICD-10-CM | POA: Insufficient documentation

## 2012-03-04 DIAGNOSIS — M549 Dorsalgia, unspecified: Secondary | ICD-10-CM | POA: Insufficient documentation

## 2012-03-04 DIAGNOSIS — S79929A Unspecified injury of unspecified thigh, initial encounter: Secondary | ICD-10-CM | POA: Insufficient documentation

## 2012-03-04 DIAGNOSIS — Z87898 Personal history of other specified conditions: Secondary | ICD-10-CM | POA: Insufficient documentation

## 2012-03-04 DIAGNOSIS — F3289 Other specified depressive episodes: Secondary | ICD-10-CM | POA: Insufficient documentation

## 2012-03-04 DIAGNOSIS — W19XXXA Unspecified fall, initial encounter: Secondary | ICD-10-CM

## 2012-03-04 DIAGNOSIS — Y929 Unspecified place or not applicable: Secondary | ICD-10-CM | POA: Insufficient documentation

## 2012-03-04 DIAGNOSIS — M542 Cervicalgia: Secondary | ICD-10-CM

## 2012-03-04 DIAGNOSIS — F329 Major depressive disorder, single episode, unspecified: Secondary | ICD-10-CM | POA: Insufficient documentation

## 2012-03-04 MED ORDER — KETOROLAC TROMETHAMINE 60 MG/2ML IM SOLN
60.0000 mg | Freq: Once | INTRAMUSCULAR | Status: AC
  Administered 2012-03-04: 60 mg via INTRAMUSCULAR
  Filled 2012-03-04: qty 2

## 2012-03-04 MED ORDER — IBUPROFEN 800 MG PO TABS
800.0000 mg | ORAL_TABLET | Freq: Once | ORAL | Status: AC
  Administered 2012-03-04: 800 mg via ORAL
  Filled 2012-03-04: qty 1

## 2012-03-04 MED ORDER — CYCLOBENZAPRINE HCL 10 MG PO TABS: 10.0000 mg | ORAL_TABLET | Freq: Two times a day (BID) | ORAL | Status: DC | PRN

## 2012-03-04 MED ORDER — ACETAMINOPHEN 500 MG PO TABS
1000.0000 mg | ORAL_TABLET | Freq: Once | ORAL | Status: AC
  Administered 2012-03-04: 1000 mg via ORAL
  Filled 2012-03-04: qty 2

## 2012-03-04 NOTE — ED Provider Notes (Signed)
History     CSN: [id number]  Arrival date & time 03/04/12  1724   First MD Initiated Contact with Patient 03/04/12 1751      Chief Complaint  Patient presents with  . Fall    (Consider location/radiation/quality/duration/timing/severity/associated sxs/prior treatment) HPI.... accidental fall in bathtub today several hours ago. No loss of consciousness.   Complains of blurred vision, neck pain, left posterior hip pain, right index finger pain.  Severity is mild to moderate. No associated neural deficits, chest pain, other bony injuries.  Pain is sharp  Past Medical History  Diagnosis Date  . Anxiety   . Depression   . Cancer     cervical CA/ Lukemia  . Seizures     last seizure was 8 mo ago.   Marland Kitchen Herniated disc   . Chronic back pain   . Lupus     Past Surgical History  Procedure Date  . Cholecystectomy   . Cesarean section   . Bone marrow transplant   . Back surgery 06/29/11    L4-5 microdiskectomy  . Intrauterine device insertion   . Nose surgery     Family History  Problem Relation Age of Onset  . Diabetes Mother   . Cancer Mother     breast cancer  . Melanoma Father   . Diabetes Maternal Grandfather   . Stroke Maternal Grandfather   . Arthritis      History  Substance Use Topics  . Smoking status: Current Every Day Smoker -- 0.2 packs/day    Types: Cigarettes  . Smokeless tobacco: Never Used  . Alcohol Use: No    OB History    Grav Para Term Preterm Abortions TAB SAB Ect Mult Living                  Review of Systems  All other systems reviewed and are negative.    Allergies  Oxycodone  Home Medications   Current Outpatient Rx  Name  Route  Sig  Dispense  Refill  . ACETAMINOPHEN 500 MG PO TABS   Oral   Take 500 mg by mouth every 6 (six) hours as needed. pain         . CETIRIZINE HCL 10 MG PO TABS   Oral   Take 10 mg by mouth daily.           Marland Kitchen CLONAZEPAM 1 MG PO TABS   Oral   Take 1 mg by mouth 3 (three) times daily as  needed. For anxiety         . DULOXETINE HCL 30 MG PO CPEP   Oral   Take 30-60 mg by mouth 2 (two) times daily. Take 2 capsules in am and 1 capsule in pm         . OXCARBAZEPINE 150 MG PO TABS   Oral   Take 150 mg by mouth at bedtime.           Marland Kitchen RANITIDINE HCL 150 MG PO TABS   Oral   Take 150 mg by mouth daily.         Marland Kitchen TIZANIDINE HCL 4 MG PO TABS   Oral   Take 4 mg by mouth 3 (three) times daily.         . TRAZODONE HCL 100 MG PO TABS   Oral   Take 100 mg by mouth at bedtime as needed. For sleep         . VITAMIN D (ERGOCALCIFEROL) 50000 UNITS PO CAPS  Oral   Take 50,000 Units by mouth 2 (two) times a week. Mondays and thursdays         . CYCLOBENZAPRINE HCL 10 MG PO TABS   Oral   Take 1 tablet (10 mg total) by mouth 2 (two) times daily as needed for muscle spasms.   20 tablet   0   . MEDROXYPROGESTERONE ACETATE 150 MG/ML IM SUSP   Intramuscular   Inject 150 mg into the muscle every 3 (three) months.           BP 95/61  Pulse 82  Temp 98.6 F (37 C) (Oral)  Resp 18  Ht 5\' 5"  (1.651 m)  Wt 115 lb (52.164 kg)  BMI 19.14 kg/m2  SpO2 98%  Physical Exam  Nursing note and vitals reviewed. Constitutional: She is oriented to person, place, and time. She appears well-developed and well-nourished.  HENT:  Head: Normocephalic and atraumatic.  Eyes: Conjunctivae normal and EOM are normal. Pupils are equal, round, and reactive to light.  Neck: Normal range of motion.       Minimal generalized posterior cervical spine tenderness  Cardiovascular: Normal rate, regular rhythm and normal heart sounds.   Pulmonary/Chest: Effort normal and breath sounds normal.  Abdominal: Soft. Bowel sounds are normal.  Musculoskeletal:       Minimal to moderate left posterior hip tenderness;  tender at PIP joint right index finger. Pain with flexion and extension  Neurological: She is alert and oriented to person, place, and time.  Skin: Skin is warm and dry.    Psychiatric: She has a normal mood and affect.    ED Course  Procedures (including critical care time)  Labs Reviewed - No data to display Dg Hip Complete Left  03/04/2012  *RADIOLOGY REPORT*  Clinical Data: Hip pain after fall in bathtub.  LEFT HIP - COMPLETE 2+ VIEW  Comparison:  None.  Findings:  There is no evidence of hip fracture or dislocation. There is no evidence of arthropathy or other focal bone abnormality.  IMPRESSION: Negative.   Original Report Authenticated By: Davonna Belling, M.D.    Ct Head Wo Contrast  03/04/2012  *RADIOLOGY REPORT*  Clinical Data:  History of fall with injury to the head and neck.  CT HEAD WITHOUT CONTRAST CT CERVICAL SPINE WITHOUT CONTRAST  Technique:  Multidetector CT imaging of the head and cervical spine was performed following the standard protocol without intravenous contrast.  Multiplanar CT image reconstructions of the cervical spine were also generated.  Comparison:  CT of the head 01/08/2010.  CT HEAD  Findings: No acute displaced skull fractures are identified.  No acute intracranial abnormality.  Specifically, no evidence of acute post-traumatic intracranial hemorrhage, no definite regions of acute/subacute cerebral ischemia, no focal mass, mass effect, hydrocephalus or abnormal intra or extra-axial fluid collections. The visualized paranasal sinuses and mastoids are well pneumatized.  IMPRESSION: 1.  No acute displaced skull fractures or acute intracranial abnormalities. 2.  The appearance of the brain is normal.  CT CERVICAL SPINE  Findings: No acute displaced cervical spine fractures.  Alignment is anatomic.  Prevertebral soft tissues are normal.  Visualized portions of the upper thorax are unremarkable.  IMPRESSION: No evidence of significant acute traumatic injury to the cervical spine.   Original Report Authenticated By: Trudie Reed, M.D.    Ct Cervical Spine Wo Contrast  03/04/2012  *RADIOLOGY REPORT*  Clinical Data:  History of fall with  injury to the head and neck.  CT HEAD WITHOUT CONTRAST CT  CERVICAL SPINE WITHOUT CONTRAST  Technique:  Multidetector CT imaging of the head and cervical spine was performed following the standard protocol without intravenous contrast.  Multiplanar CT image reconstructions of the cervical spine were also generated.  Comparison:  CT of the head 01/08/2010.  CT HEAD  Findings: No acute displaced skull fractures are identified.  No acute intracranial abnormality.  Specifically, no evidence of acute post-traumatic intracranial hemorrhage, no definite regions of acute/subacute cerebral ischemia, no focal mass, mass effect, hydrocephalus or abnormal intra or extra-axial fluid collections. The visualized paranasal sinuses and mastoids are well pneumatized.  IMPRESSION: 1.  No acute displaced skull fractures or acute intracranial abnormalities. 2.  The appearance of the brain is normal.  CT CERVICAL SPINE  Findings: No acute displaced cervical spine fractures.  Alignment is anatomic.  Prevertebral soft tissues are normal.  Visualized portions of the upper thorax are unremarkable.  IMPRESSION: No evidence of significant acute traumatic injury to the cervical spine.   Original Report Authenticated By: Trudie Reed, M.D.    Dg Finger Index Right  03/04/2012  *RADIOLOGY REPORT*  Clinical Data: Larey Seat, pain  RIGHT INDEX FINGER 2+V  Comparison:  None.  Findings:  There is no evidence of fracture or dislocation.  There is no evidence of arthropathy or other focal bone abnormality. Soft tissues are unremarkable.  IMPRESSION: Negative.   Original Report Authenticated By: Davonna Belling, M.D.      1. Fall   2. Neck pain   3. Minor head injury   4. Left hip pain       MDM  All x-rays were normal.  No neurological deficits. Discharge home on Flexeril 10 mg #20.  Ibuprofen or Tylenol for pain        Donnetta Hutching, MD 03/04/12 [phone]

## 2012-03-04 NOTE — ED Notes (Signed)
Pt reports vomiting x3 days, seeing double since fall.

## 2012-03-04 NOTE — ED Notes (Signed)
Pt reports slipping and falling in tub today, fell 6  Hours ago, cont. To have neck/back pain, right hand index finger is painful. bil shoulder pain. Unsure of loc.

## 2012-03-04 NOTE — ED Notes (Signed)
Pt returned from xray, pain medication given as ordered, pt states that she has been having falls since having back surgery in march 2013, feels that she is "unsteady and out of line" since the surgery, does use a cane which does not seem to help that much per pt. Pt fell once last night and then again this am, states that the incident this am was more like a "near fall" pt was able to catch herself. Dr. Adriana Simas at bedside prior to RN, see edp assessment for further.

## 2012-03-04 NOTE — ED Notes (Signed)
c-collar placed in triage

## 2012-03-15 ENCOUNTER — Ambulatory Visit (HOSPITAL_COMMUNITY)
Admission: RE | Admit: 2012-03-15 | Discharge: 2012-03-15 | Disposition: A | Discharge status: Home / Self Care | Payer: Medicaid Other | Source: Ambulatory Visit | Attending: Nurse Practitioner | Admitting: Nurse Practitioner

## 2012-03-15 DIAGNOSIS — R269 Unspecified abnormalities of gait and mobility: Secondary | ICD-10-CM | POA: Insufficient documentation

## 2012-03-15 DIAGNOSIS — IMO0001 Reserved for inherently not codable concepts without codable children: Secondary | ICD-10-CM | POA: Insufficient documentation

## 2012-03-15 DIAGNOSIS — M545 Low back pain, unspecified: Secondary | ICD-10-CM | POA: Insufficient documentation

## 2012-03-15 NOTE — Evaluation (Signed)
Physical Therapy Evaluation/Medicaid  Patient Details  Name: Lauren Cross MRN: [id number] Date of Birth: 1976-03-10  Today's Date: 03/15/2012 Time: 1520-1555 PT Time Calculation (min): 35 min Charges: 1 eval Visit#: 1  of 4   Re-eval: 04/14/12 Assessment Diagnosis: Low Back Pain Surgical Date: 07/05/11 Next MD Visit: Dr. Fredda Hammed  Authorization: MEDICAID  Authorization Time Period:    Authorization Visit#: 1  of 4    Past Medical History:  Past Medical History  Diagnosis Date  . Anxiety   . Depression   . Cancer     cervical CA/ Lukemia  . Seizures     last seizure was 8 mo ago.   Marland Kitchen Herniated disc   . Chronic back pain   . Lupus    Past Surgical History:  Past Surgical History  Procedure Date  . Cholecystectomy   . Cesarean section   . Bone marrow transplant   . Back surgery 06/29/11    L4-5 microdiskectomy  . Intrauterine device insertion   . Nose surgery     Subjective Symptoms/Limitations Pertinent History: Pt is referred to PT for low back pain from low back surgery on 07/05/11 due to difficulty in walking from back pain with radicular pain down her L leg.  She had 2 bulging discs and 1 ruptured disc.  She reports that she had fallen in [place] when she was coming down her stairs and landed on her L side and had brusing on her L side.  She continues to have significant pain and wears her back brace all day.  She reports that she has to take care of her 38 year old special needs child.  Pt is recieving conseuling for depression.  How long can you sit comfortably?: 5 minutes How long can you stand comfortably?: 5 minutes How long can you walk comfortably?: 5 minutes  Pain Assessment Currently in Pain?: Yes Pain Score:   7 Pain Location: Back Pain Orientation: Left Pain Type: Acute pain;Chronic pain Pain Relieving Factors: laying flat Effect of Pain on Daily Activities: unable to stand, walk and fully care for her 68 year old child  Precautions/Restrictions    Precautions Precautions: Back  Prior Functioning  Home Living Lives With: Daughter;Significant other Prior Function Driving: No Comments: She takes care of her 10 year old daughter with special needs.  Daughter weighs 28lbs.    Cognition/Observation Observation/Other Assessments Observations: wears back brace modeling on low back from earlier burn when she fell asleep on heating pad  Sensation/Coordination/Flexibility/Functional Tests Coordination Gross Motor Movements are Fluid and Coordinated: No Coordination and Movement Description: impaired TrA, multifidus, and PF contraction requires significant compensations Functional Tests Functional Tests: ODI: 72%  Assessment RLE Strength Right Hip Flexion: 3+/5 Right Hip Extension: 4/5 Right Hip ABduction: 4/5 Right Knee Flexion: 4/5 Right Knee Extension: 4/5 LLE Strength Left Hip Flexion: 3+/5 Left Hip Extension: 4/5 Left Hip ABduction: 4/5 Left Knee Flexion: 4/5 Left Knee Extension: 4/5 Palpation Palpation: significant pain and tenderness to lumbar erector spinae  Mobility/Balance  Ambulation/Gait Ambulation/Gait: Yes Gait Pattern: Decreased stance time - left;Decreased dorsiflexion - left;Decreased hip/knee flexion - left Posture/Postural Control Posture/Postural Control: Postural limitations Postural Limitations: guarded lumbar posture   Exercise/Treatments Supine Ab Set: Limitations AB Set Limitations: NMR for TrA contraction Bridge: 10 reps Straight Leg Raise: 5 reps (BLE) Prone  Straight Leg Raise: 10 reps (BLE) Other Prone Lumbar Exercises: knee flexion x10 reps BLE  Physical Therapy Assessment and Plan PT Assessment and Plan Clinical Impression Statement: Pt is a  36 year old female referred to PT for LBP s/p lumbar laminectomy in March.  Pt will benefit from skilled therapeutic intervention in order to improve on the following deficits: Pain;Abnormal gait;Decreased coordination;Decreased  balance;Decreased strength;Improper body mechanics Rehab Potential: Fair Clinical Impairments Affecting Rehab Potential: secondary to insurance PT Frequency: Min 3X/week PT Duration: 4 weeks PT Treatment/Interventions: Functional mobility training;Therapeutic activities;Therapeutic exercise;Balance training;Neuromuscular re-education;Patient/family education;Manual techniques PT Plan: No modalities hx of cancer.  continue with LE and core strengthening and stabilization (squats, heel and toe raises, t-bands, TrA, bent knee raise, PF, multifidus).  Progress to LE fleixbiliyty and educate on proper lifiting techniques.     Goals Home Exercise Program Pt will Perform Home Exercise Program: Independently PT Goal: Perform Home Exercise Program - Progress: Goal set today PT Short Term Goals Time to Complete Short Term Goals: 4 weeks PT Short Term Goal 1: Pt will report pain less than 5/10 for 50% of her day for improved QOL.  PT Short Term Goal 2: Pt will improve independent core strength.  PT Short Term Goal 3: Pt will improve LE strength by 1 muscle grade.  PT Short Term Goal 4: Pt will present with decreased fascial restrictions to lumbar erector spinae.  PT Short Term Goal 5: Pt will improve flexibility in order to demonstrate apporpriate body mechanics to care for her child.   Problem List Patient Active Problem List  Diagnosis  . Ankle pain, left  . Ankle sprain  . Lumbago    PT Plan of Care PT Home Exercise Plan: see scanned report PT Patient Instructions: importance of core musculature and posture Consulted and Agree with Plan of Care: Patient  Annett Fabian, PT 03/15/2012, 6:08 PM  Physician Documentation Your signature is required to indicate approval of the treatment plan as stated above.  Please sign and either send electronically or make a copy of this report for your files and return this physician signed original.   Please mark one 1.__approve of plan  2. ___approve of  plan with the following conditions.   ______________________________                                                          _____________________ Physician Signature                                                                                                             Date  INITIAL EVALUATION  Physical Therapy     Patient Name: MAJORIE ALLANSON Abrigo Date Of Birth: 1975-06-18  Guardian Name: N/A Treatment ICD-9 Code: 7242  Address: [address] Date of Evaluation: 03/15/2012  [place], [place] [address] Requested Dates of Service: 03/15/2012 - 04/12/2012       Therapy History: No known therapy for this problem  Reason For Referral: Recipient has a new injury, disease or condition  Prior Level of Function: Independent/Modified  Independent with all ADLs (OT/PT) or Audition, Communication, Voice and/or Swallowing Skills (ST/AUD)  Additional Medical History: Pertinent History: Pt is referred to PT for low back pain from low back surgery on 07/05/11 due to difficulty in walking from back pain with radicular pain down her L leg. She had 2 bulging discs and 1 ruptured disc. She reports that she had fallen in [place] when she was coming down her stairs and landed on her L side and had brusing on her L side. She continues to have significant pain and wears her back brace all day. She reports that she has to take care of her 38 year old special needs child. Pt is recieving conseuling for depression. How long can you sit comfortably?: 5 minutes How long can you stand comfortably?: 5 minutes How long can you walk comfortably?: 5 minutes Pain Assessment Currently in Pain?: Yes Pain Score: 7 Pain Location: Back Pain Orientation: Left Pain Type: Acute pain;Chronic pain Pain Relieving Factors: laying flat Effect of Pain on Daily Activities: unable to stand, walk and fully care for her 49 year old child Precautions/Restrictions Precautions Precautions: Back Prior Functioning Home Living Lives With: Daughter;Significant  other Prior Function Driving: No Comments: She takes care of her 54 year old daughter with special needs. Daughter weighs 28lbs. Functional Tests: ODI: 72% Observations: wears back brace and modeling on low back from earlier burn when she fell asleep on heating pad; Postural Limitations: guarded lumbar posture   Prematurity: N/A  Severity Level: N/A       Treatment Goals:  1. Goal: Pt will Perform Home Exercise Program: Independently  Baseline: Education  Duration: 2 Week(s)  2. Goal: Pt will report pain less than 5/10 for 50% of her day for improved QOL.  Baseline: Pain: 7-9/10  Duration: 4 Week(s)  3. Goal: Pt will improve independent core strength.  Baseline: : impaired TrA, multifidus, and PF contraction requires significant compensations  Duration: 4 Week(s)  4. Goal: Pt will improve LE strength by 1 muscle grade.  Baseline: RLE Strength Right Hip Flexion: 3+/5 Right Hip Extension: 4/5 Right Hip ABduction: 4/5 Right Knee Flexion: 4/5 Right Knee Extension: 4/5 LLE Strength Left Hip Flexion: 3+/5 Left Hip Extension: 4/5 Left Hip ABduction: 4/5 Left Knee Flexion: 4/5 Left Knee Extension: 4/5  Duration: 4 Week(s)  5. Goal: Pt will present with decreased fascial restrictions to lumbar erector spinae.  Baseline: Palpation: significant pain and tenderness to lumbar erector spinae  Duration: 4 Week(s)  Goal: Pt will improve flexibility in order to demonstrate apporpriate body mechanics to care for her child.  Baseline: impaired hamstring and hip flexor length  Duration: 4 Week(s)         Treatment Frequency/Duration:  3x/week for 12 weeks  Units per visit: N/A    Additional Information: Clinical Impression Statement: Pt is a 36 year old female referred to PT for LBP s/p lumbar laminectomy in March. Pt will benefit from skilled therapeutic intervention in order to improve on the following deficits: Pain;Abnormal gait;Decreased coordination;Decreased balance;Decreased strength;Improper body  mechanics Rehab Potential: Fair Clinical Impairments Affecting Rehab Potential: secondary to insurance PT Frequency: Min 3X/week PT Duration: 4 weeks PT Treatment/Interventions: Functional mobility training;Therapeutic activities;Therapeutic exercise;Balance training;Neuromuscular re-education;Patient/family education;Manual techniques           Therapist Signature  Date Physician Signature  Date    Annett Fabian       Therapist Name  Physician Name   Refer to the Review Status page for current case status

## 2012-03-22 ENCOUNTER — Ambulatory Visit (HOSPITAL_COMMUNITY)
Admission: RE | Admit: 2012-03-22 | Discharge: 2012-03-22 | Disposition: A | Discharge status: Home / Self Care | Payer: Medicaid Other | Source: Ambulatory Visit | Attending: Nurse Practitioner | Admitting: Nurse Practitioner

## 2012-03-22 DIAGNOSIS — M545 Low back pain, unspecified: Secondary | ICD-10-CM | POA: Insufficient documentation

## 2012-03-22 DIAGNOSIS — R269 Unspecified abnormalities of gait and mobility: Secondary | ICD-10-CM | POA: Insufficient documentation

## 2012-03-22 DIAGNOSIS — IMO0001 Reserved for inherently not codable concepts without codable children: Secondary | ICD-10-CM | POA: Insufficient documentation

## 2012-03-22 NOTE — Progress Notes (Signed)
Physical Therapy Treatment Patient Details  Name: Armella Stogner MRN: [id number] Date of Birth: 14-Mar-1976  Today's Date: 03/22/2012 Time: 4098-1191 PT Time Calculation (min): 57 min  Visit#: 2  of 4   Re-eval: 04/14/12 Assessment Diagnosis: Low Back Pain Surgical Date: 07/05/11 Next MD Visit: Dr. Fredda Hammed Charge: therex 38', mechanical lumbar traction x 17'  Authorization: Medicaid  Authorization Time Period:    Authorization Visit#: 2  of 4    Subjective: Symptoms/Limitations Symptoms: Pt reported LBP with L LE radicular pain posterior, 10/10 pain scale.  Pt reported compliance with HEP, no questions with any of the exercises. Pain Assessment Currently in Pain?: Yes Pain Score: 10-Worst pain ever Pain Location: Back Pain Orientation: Lower;Left Pain Radiating Towards: posterior L LE  Precautions/Restrictions  Precautions Precautions: Back  Exercise/Treatments Standing Heel Raises: 15 reps;Limitations Heel Raises Limitations: toe raises Functional Squats: 15 reps Scapular Retraction: 15 reps;Theraband Theraband Level (Scapular Retraction): Level 3 (Green) Row: 15 reps;Theraband Theraband Level (Row): Level 3 (Green) Shoulder Extension: 15 reps;Theraband Theraband Level (Shoulder Extension): Level 4 (Blue) Supine Ab Set: Limitations AB Set Limitations: NMR for TrA contraction 10x 10" with tactile cueing for correct musculature Bent Knee Raise: 10 reps;5 seconds Bridge: 15 reps Straight Leg Raise: 10 reps Prone  Straight Leg Raise: 10 reps ( BLE) Other Prone Lumbar Exercises: Multifidus with tactile cueing 10x 10"  Modalities Modalities: Traction Traction Type of Traction: Lumbar Max (lbs): 55-->60 static Hold Time: static Time: 27'  Physical Therapy Assessment and Plan PT Assessment and Plan Clinical Impression Statement: Began treatment for core strengthening and stability therex and pain control per PT POC.  Pt able to demonstrate appropriate technique  with postural strengthening, pt given tband and worksheet to add postural strengthening to HEP.  Ended session with mechanical lumbar traction to reduce LBP and radicular sympotms down L LE, good results following new modalitity pt reported pain scale 7/10 (decreased 3 levels).  Pt encouraged to drink extra water to reduce risk of headaches.   PT Plan: Assess results with traction.  Continue with current POC for LE and core strengthening/stability.  Progress to LE flexibility educate on proper lifting techniques.  No modalities hx of cancer.      Goals    Problem List Patient Active Problem List  Diagnosis  . Ankle pain, left  . Ankle sprain  . Lumbago    PT - End of Session Activity Tolerance: Patient tolerated treatment well General Behavior During Session: [facility] for tasks performed Cognition: [facility] for tasks performed  GP    Juel Burrow 03/22/2012, 5:11 PM

## 2012-03-29 ENCOUNTER — Ambulatory Visit (HOSPITAL_COMMUNITY): Payer: Medicaid Other

## 2012-04-05 ENCOUNTER — Ambulatory Visit (HOSPITAL_COMMUNITY): Payer: Medicaid Other | Admitting: Physical Therapy

## 2012-05-12 DIAGNOSIS — Z856 Personal history of leukemia: Secondary | ICD-10-CM | POA: Insufficient documentation

## 2012-05-12 DIAGNOSIS — Z8669 Personal history of other diseases of the nervous system and sense organs: Secondary | ICD-10-CM | POA: Insufficient documentation

## 2012-05-12 DIAGNOSIS — K219 Gastro-esophageal reflux disease without esophagitis: Secondary | ICD-10-CM

## 2012-05-12 DIAGNOSIS — F17200 Nicotine dependence, unspecified, uncomplicated: Secondary | ICD-10-CM | POA: Insufficient documentation

## 2012-05-12 DIAGNOSIS — Z87898 Personal history of other specified conditions: Secondary | ICD-10-CM | POA: Insufficient documentation

## 2012-05-12 DIAGNOSIS — Z8673 Personal history of transient ischemic attack (TIA), and cerebral infarction without residual deficits: Secondary | ICD-10-CM | POA: Insufficient documentation

## 2012-08-04 ENCOUNTER — Emergency Department (HOSPITAL_COMMUNITY)
Admission: EM | Admit: 2012-08-04 | Discharge: 2012-08-05 | Disposition: A | Discharge status: Home / Self Care | Payer: Medicaid Other | Attending: Emergency Medicine | Admitting: Emergency Medicine

## 2012-08-04 ENCOUNTER — Emergency Department (HOSPITAL_COMMUNITY): Payer: Medicaid Other

## 2012-08-04 ENCOUNTER — Encounter (HOSPITAL_COMMUNITY): Payer: Self-pay | Admitting: *Deleted

## 2012-08-04 DIAGNOSIS — F17200 Nicotine dependence, unspecified, uncomplicated: Secondary | ICD-10-CM | POA: Insufficient documentation

## 2012-08-04 DIAGNOSIS — S0101XA Laceration without foreign body of scalp, initial encounter: Secondary | ICD-10-CM

## 2012-08-04 DIAGNOSIS — Z9889 Other specified postprocedural states: Secondary | ICD-10-CM | POA: Insufficient documentation

## 2012-08-04 DIAGNOSIS — S066X9A Traumatic subarachnoid hemorrhage with loss of consciousness of unspecified duration, initial encounter: Secondary | ICD-10-CM | POA: Insufficient documentation

## 2012-08-04 DIAGNOSIS — Z8541 Personal history of malignant neoplasm of cervix uteri: Secondary | ICD-10-CM | POA: Insufficient documentation

## 2012-08-04 DIAGNOSIS — Z9481 Bone marrow transplant status: Secondary | ICD-10-CM | POA: Insufficient documentation

## 2012-08-04 DIAGNOSIS — F411 Generalized anxiety disorder: Secondary | ICD-10-CM | POA: Insufficient documentation

## 2012-08-04 DIAGNOSIS — IMO0002 Reserved for concepts with insufficient information to code with codable children: Secondary | ICD-10-CM | POA: Insufficient documentation

## 2012-08-04 DIAGNOSIS — Z79899 Other long term (current) drug therapy: Secondary | ICD-10-CM | POA: Insufficient documentation

## 2012-08-04 DIAGNOSIS — I609 Nontraumatic subarachnoid hemorrhage, unspecified: Secondary | ICD-10-CM

## 2012-08-04 DIAGNOSIS — G8929 Other chronic pain: Secondary | ICD-10-CM | POA: Insufficient documentation

## 2012-08-04 DIAGNOSIS — S02109A Fracture of base of skull, unspecified side, initial encounter for closed fracture: Secondary | ICD-10-CM | POA: Insufficient documentation

## 2012-08-04 DIAGNOSIS — F329 Major depressive disorder, single episode, unspecified: Secondary | ICD-10-CM | POA: Insufficient documentation

## 2012-08-04 DIAGNOSIS — T07XXXA Unspecified multiple injuries, initial encounter: Secondary | ICD-10-CM

## 2012-08-04 DIAGNOSIS — M533 Sacrococcygeal disorders, not elsewhere classified: Secondary | ICD-10-CM

## 2012-08-04 DIAGNOSIS — Z856 Personal history of leukemia: Secondary | ICD-10-CM | POA: Insufficient documentation

## 2012-08-04 DIAGNOSIS — Z8739 Personal history of other diseases of the musculoskeletal system and connective tissue: Secondary | ICD-10-CM | POA: Insufficient documentation

## 2012-08-04 DIAGNOSIS — S0100XA Unspecified open wound of scalp, initial encounter: Secondary | ICD-10-CM | POA: Insufficient documentation

## 2012-08-04 DIAGNOSIS — S02119A Unspecified fracture of occiput, initial encounter for closed fracture: Secondary | ICD-10-CM

## 2012-08-04 DIAGNOSIS — F3289 Other specified depressive episodes: Secondary | ICD-10-CM | POA: Insufficient documentation

## 2012-08-04 LAB — RAPID URINE DRUG SCREEN, HOSP PERFORMED
Amphetamines: NOT DETECTED
Barbiturates: NOT DETECTED
Benzodiazepines: NOT DETECTED
Cocaine: NOT DETECTED
Tetrahydrocannabinol: NOT DETECTED

## 2012-08-04 LAB — ETHANOL: Alcohol, Ethyl (B): <11 mg/dL (ref 0–11)

## 2012-08-04 MED ORDER — IBUPROFEN 800 MG PO TABS
800.0000 mg | ORAL_TABLET | Freq: Once | ORAL | Status: AC
Start: 2012-08-04 — End: 2012-08-04
  Administered 2012-08-04: 800 mg via ORAL
  Filled 2012-08-04: qty 1

## 2012-08-04 MED ORDER — HYDROCODONE-ACETAMINOPHEN 5-325 MG PO TABS
1.0000 | ORAL_TABLET | Freq: Once | ORAL | Status: AC
  Administered 2012-08-04: 1 via ORAL
  Filled 2012-08-04: qty 1

## 2012-08-04 MED ORDER — SODIUM CHLORIDE 0.9 % IV SOLN
Freq: Once | INTRAVENOUS | Status: AC
  Administered 2012-08-05: 1000 mL via INTRAVENOUS
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
## 2012-08-05 ENCOUNTER — Ambulatory Visit (HOSPITAL_COMMUNITY)
Admit: 2012-08-05 | Discharge: 2012-08-05 | Disposition: A | Discharge status: Home / Self Care | Payer: Medicaid Other | Source: Ambulatory Visit | Attending: Emergency Medicine | Admitting: Emergency Medicine

## 2012-08-05 DIAGNOSIS — S0003XA Contusion of scalp, initial encounter: Secondary | ICD-10-CM | POA: Insufficient documentation

## 2012-08-05 DIAGNOSIS — I609 Nontraumatic subarachnoid hemorrhage, unspecified: Secondary | ICD-10-CM | POA: Insufficient documentation

## 2012-08-05 MED ORDER — HYDROCODONE-ACETAMINOPHEN 5-325 MG PO TABS: 1.0000 | ORAL_TABLET | Freq: Once | ORAL | Status: DC

## 2012-08-05 MED ORDER — PROMETHAZINE HCL 25 MG PO TABS: 25.0000 mg | ORAL_TABLET | Freq: Four times a day (QID) | ORAL | Status: DC | PRN

## 2012-08-05 MED ORDER — LORATADINE 10 MG PO TABS
ORAL_TABLET | ORAL | Status: AC
  Filled 2012-08-05: qty 1

## 2012-08-05 MED ORDER — IBUPROFEN 800 MG PO TABS
ORAL_TABLET | ORAL | Status: AC
  Filled 2012-08-05: qty 1

## 2012-08-05 MED ORDER — HYDROCODONE-ACETAMINOPHEN 5-325 MG PO TABS: 1.0000 | ORAL_TABLET | Freq: Four times a day (QID) | ORAL | Status: DC | PRN

## 2012-08-05 MED ORDER — HYDROCODONE-ACETAMINOPHEN 5-325 MG PO TABS: 1.0000 | ORAL_TABLET | ORAL | Status: DC | PRN

## 2012-08-05 MED ORDER — HYDROCODONE-ACETAMINOPHEN 5-325 MG PO TABS
1.0000 | ORAL_TABLET | Freq: Once | ORAL | Status: AC
  Administered 2012-08-05: 1 via ORAL
  Filled 2012-08-05: qty 1
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
## 2012-08-05 NOTE — ED Notes (Signed)
Pt alert & oriented x4, stable gait. Patient given discharge instructions, paperwork & prescription(s). Patient  instructed to stop at the registration desk to finish any additional paperwork. Patient verbalized understanding. Pt left department w/ no further questions. 

## 2012-08-05 NOTE — ED Provider Notes (Signed)
Pt had ct today,  No change.  Spoke with dr. Venetia Maxon who stated the pt can follow up with dr. Wynetta Emery in 2-3 weeks  Benny Lennert, MD 08/05/12 [phone]

## 2012-08-07 ENCOUNTER — Encounter: Payer: Self-pay | Admitting: Emergency Medicine

## 2013-04-14 ENCOUNTER — Encounter (HOSPITAL_COMMUNITY): Payer: Self-pay | Admitting: Emergency Medicine

## 2013-04-14 ENCOUNTER — Emergency Department (HOSPITAL_COMMUNITY)
Admission: EM | Admit: 2013-04-14 | Discharge: 2013-04-14 | Disposition: A | Discharge status: Home / Self Care | Payer: Medicaid Other | Attending: Emergency Medicine | Admitting: Emergency Medicine

## 2013-04-14 DIAGNOSIS — R5381 Other malaise: Secondary | ICD-10-CM | POA: Insufficient documentation

## 2013-04-14 DIAGNOSIS — R1013 Epigastric pain: Secondary | ICD-10-CM | POA: Insufficient documentation

## 2013-04-14 DIAGNOSIS — G8929 Other chronic pain: Secondary | ICD-10-CM | POA: Insufficient documentation

## 2013-04-14 DIAGNOSIS — F329 Major depressive disorder, single episode, unspecified: Secondary | ICD-10-CM | POA: Insufficient documentation

## 2013-04-14 DIAGNOSIS — G40909 Epilepsy, unspecified, not intractable, without status epilepticus: Secondary | ICD-10-CM | POA: Insufficient documentation

## 2013-04-14 DIAGNOSIS — F3289 Other specified depressive episodes: Secondary | ICD-10-CM | POA: Insufficient documentation

## 2013-04-14 DIAGNOSIS — F411 Generalized anxiety disorder: Secondary | ICD-10-CM | POA: Insufficient documentation

## 2013-04-14 DIAGNOSIS — Z8541 Personal history of malignant neoplasm of cervix uteri: Secondary | ICD-10-CM | POA: Insufficient documentation

## 2013-04-14 DIAGNOSIS — Z79899 Other long term (current) drug therapy: Secondary | ICD-10-CM | POA: Insufficient documentation

## 2013-04-14 DIAGNOSIS — R1110 Vomiting, unspecified: Secondary | ICD-10-CM | POA: Insufficient documentation

## 2013-04-14 DIAGNOSIS — Z9089 Acquired absence of other organs: Secondary | ICD-10-CM | POA: Insufficient documentation

## 2013-04-14 DIAGNOSIS — F17200 Nicotine dependence, unspecified, uncomplicated: Secondary | ICD-10-CM | POA: Insufficient documentation

## 2013-04-14 DIAGNOSIS — R109 Unspecified abdominal pain: Secondary | ICD-10-CM

## 2013-04-14 DIAGNOSIS — M549 Dorsalgia, unspecified: Secondary | ICD-10-CM | POA: Insufficient documentation

## 2013-04-14 LAB — COMPREHENSIVE METABOLIC PANEL
ALT: 9 U/L (ref 0–35)
AST: 16 U/L (ref 0–37)
Albumin: 3.8 g/dL (ref 3.5–5.2)
Alkaline Phosphatase: 89 U/L (ref 39–117)
BUN: 4 mg/dL — ABNORMAL LOW (ref 6–23)
CO2: 23 mEq/L (ref 19–32)
Calcium: 9.7 mg/dL (ref 8.4–10.5)
Chloride: 98 mEq/L (ref 96–112)
Creatinine, Ser: 0.53 mg/dL (ref 0.50–1.10)
GFR calc Af Amer: >90 mL/min (ref >90)
GFR calc non Af Amer: >90 mL/min (ref >90)
Glucose, Bld: 89 mg/dL (ref 70–99)
Potassium: 3.3 mEq/L — ABNORMAL LOW (ref 3.5–5.1)
Sodium: 134 mEq/L — ABNORMAL LOW (ref 135–145)
Total Bilirubin: 0.4 mg/dL (ref 0.3–1.2)
Total Protein: 7.3 g/dL (ref 6.0–8.3)

## 2013-04-14 LAB — CBC WITH DIFFERENTIAL/PLATELET
Basophils Absolute: 0.1 10*3/uL (ref 0.0–0.1)
Basophils Relative: 0 % (ref 0–1)
Eosinophils Absolute: 0.2 10*3/uL (ref 0.0–0.7)
Eosinophils Relative: 1 % (ref 0–5)
HCT: 39.7 % (ref 36.0–46.0)
Hemoglobin: 13.5 g/dL (ref 12.0–15.0)
Lymphocytes Relative: 22 % (ref 12–46)
Lymphs Abs: 2.9 10*3/uL (ref 0.7–4.0)
MCH: 31.8 pg (ref 26.0–34.0)
MCHC: 34 g/dL (ref 30.0–36.0)
MCV: 93.4 fL (ref 78.0–100.0)
Monocytes Absolute: 0.5 10*3/uL (ref 0.1–1.0)
Monocytes Relative: 4 % (ref 3–12)
Neutro Abs: 9.3 10*3/uL — ABNORMAL HIGH (ref 1.7–7.7)
Neutrophils Relative %: 72 % (ref 43–77)
Platelets: 386 10*3/uL (ref 150–400)
RBC: 4.25 MIL/uL (ref 3.87–5.11)
RDW: 13.4 % (ref 11.5–15.5)
WBC: 12.9 10*3/uL — ABNORMAL HIGH (ref 4.0–10.5)

## 2013-04-14 LAB — LIPASE, BLOOD: Lipase: 29 U/L (ref 11–59)

## 2013-04-14 MED ORDER — ONDANSETRON HCL 4 MG/2ML IJ SOLN
4.0000 mg | Freq: Once | INTRAMUSCULAR | Status: DC
  Filled 2013-04-14: qty 2

## 2013-04-14 MED ORDER — ONDANSETRON HCL 4 MG PO TABS: 4.0000 mg | ORAL_TABLET | Freq: Four times a day (QID) | ORAL | Status: DC

## 2013-04-14 MED ORDER — MORPHINE SULFATE 4 MG/ML IJ SOLN
4.0000 mg | Freq: Once | INTRAMUSCULAR | Status: AC
  Administered 2013-04-14: 4 mg via INTRAVENOUS
  Filled 2013-04-14: qty 1

## 2013-04-14 MED ORDER — GI COCKTAIL ~~LOC~~
30.0000 mL | Freq: Once | ORAL | Status: AC
  Administered 2013-04-14: 30 mL via ORAL
  Filled 2013-04-14: qty 30

## 2013-04-14 MED ORDER — SODIUM CHLORIDE 0.9 % IV BOLUS (SEPSIS)
1000.0000 mL | Freq: Once | INTRAVENOUS | Status: AC
  Administered 2013-04-14: 1000 mL via INTRAVENOUS

## 2013-04-14 MED ORDER — FAMOTIDINE 20 MG PO TABS: 20.0000 mg | ORAL_TABLET | Freq: Two times a day (BID) | ORAL | Status: DC

## 2013-04-14 MED ORDER — FAMOTIDINE IN NACL 20-0.9 MG/50ML-% IV SOLN
20.0000 mg | Freq: Once | INTRAVENOUS | Status: AC
  Administered 2013-04-14: 20 mg via INTRAVENOUS
  Filled 2013-04-14: qty 50

## 2013-04-14 MED ORDER — SUCRALFATE 1 G PO TABS: 1.0000 g | ORAL_TABLET | Freq: Three times a day (TID) | ORAL | Status: DC

## 2013-04-14 MED ORDER — ONDANSETRON HCL 4 MG/2ML IJ SOLN
4.0000 mg | Freq: Once | INTRAMUSCULAR | Status: AC
  Administered 2013-04-14: 4 mg via INTRAVENOUS

## 2013-04-14 MED ORDER — TRAMADOL HCL 50 MG PO TABS: 50.0000 mg | ORAL_TABLET | Freq: Four times a day (QID) | ORAL | Status: DC | PRN

## 2013-04-14 NOTE — ED Notes (Signed)
Pt c/o intermittent vomiting and diarrhea x 1 1/2 months.  Reports is supposed to get an appt with a GI doctor but has not heard from anyone yet.

## 2013-04-14 NOTE — ED Notes (Signed)
Patient etting IVF before discharge.

## 2013-04-14 NOTE — ED Provider Notes (Signed)
CSN: [id number]     Arrival date & time 04/14/13  1146 History   This chart was scribed for [name], MD by Smiley Houseman, ED Scribe. The patient was seen in room APA10/APA10. Patient's care was started at 2:00 PM.   Chief Complaint  Patient presents with  . Emesis  . Diarrhea    The history is provided by the patient. No language interpreter was used.   HPI Comments: Lauren Cross is a 37 y.o. female who presents to the Emergency Department complaining of severe constant worsening upper abdominal pain that started about one month ago.  Pt reports the pain radiates down the left side of her abdomen and into her back.  Pt describes the abdominal pain as "someone is ripping her guts out." She has associated persistent vomiting after eating and one instance of bright red blood on the side of the toilet during an episode of diarrhea.  She denies any other blood in stool and denies hematemesis.  She states she has lost about 18 pounds in the last month.  She states that nothing relieves the pain. She also complains of generalized weakness and states she passed out on 11/27.  She denies fever and dysuria.  She has taken anti-nausea medication for three days with no relief and was prescribed an antibiotic recently due to high white count levels, which she has taken without relief.  She has a h/o of cholecystectomy and a cesarean section, but denies any recent surgeries.  Pt is a smoker and rarely drinks alcohol.  She reports an allergy to oxycodone.  She denies h/o colonoscopy or endoscopy.    Past Medical History  Diagnosis Date  . Anxiety   . Depression   . Seizures     last seizure was 8 mo ago.   Marland Kitchen Herniated disc   . Chronic back pain   . Lupus   . Cancer     cervical CA/ Lukemia   Past Surgical History  Procedure Laterality Date  . Cholecystectomy    . Cesarean section    . Bone marrow transplant    . Back surgery  06/29/11    L4-5 microdiskectomy  . Intrauterine device insertion     . Nose surgery     Family History  Problem Relation Age of Onset  . Diabetes Mother   . Cancer Mother     breast cancer  . Melanoma Father   . Diabetes Maternal Grandfather   . Stroke Maternal Grandfather   . Arthritis     History  Substance Use Topics  . Smoking status: Current Every Day Smoker -- 0.25 packs/day    Types: Cigarettes  . Smokeless tobacco: Never Used  . Alcohol Use: No   OB History   Grav Para Term Preterm Abortions TAB SAB Ect Mult Living                 Review of Systems  All other systems reviewed and are negative.    Allergies  Oxycodone  Home Medications   Current Outpatient Rx  Name  Route  Sig  Dispense  Refill  . acetaminophen (TYLENOL) 500 MG tablet   Oral   Take 500 mg by mouth every 6 (six) hours as needed. pain         . cetirizine (ZYRTEC) 10 MG tablet   Oral   Take 10 mg by mouth daily.           . clonazePAM (KLONOPIN) 2 MG tablet  Oral   Take 2 mg by mouth 3 (three) times daily as needed for anxiety.         . DULoxetine (CYMBALTA) 30 MG capsule   Oral   Take 30-60 mg by mouth 2 (two) times daily. Take 2 capsules in am and 1 capsule in pm         . OXcarbazepine (TRILEPTAL) 150 MG tablet   Oral   Take 150 mg by mouth at bedtime.           . ranitidine (ZANTAC) 150 MG tablet   Oral   Take 150 mg by mouth daily.         . medroxyPROGESTERone (DEPO-PROVERA) 150 MG/ML injection   Intramuscular   Inject 150 mg into the muscle every 3 (three) months.          Triage Vitals:BP 107/86  Pulse 89  Temp(Src) 98.3 F (36.8 C) (Oral)  Resp 20  SpO2 99%  LMP 04/10/2013 Physical Exam  Nursing note and vitals reviewed. Constitutional: She appears well-developed and well-nourished. No distress.  HENT:  Head: Normocephalic and atraumatic.  Eyes: Conjunctivae are normal. Right eye exhibits no discharge. Left eye exhibits no discharge. No scleral icterus.  Neck: Neck supple.  Cardiovascular: Normal rate,  regular rhythm and normal heart sounds.  Exam reveals no gallop and no friction rub.   No murmur heard. Pulmonary/Chest: Effort normal and breath sounds normal. No respiratory distress.  Abdominal: Soft. She exhibits no distension. There is tenderness (epigastric). There is no rebound and no guarding.  Musculoskeletal: She exhibits no edema and no tenderness.  Neurological: She is alert.  Skin: Skin is warm and dry.  Psychiatric: She has a normal mood and affect. Her behavior is normal. Thought content normal.    ED Course  Procedures (including critical care time) DIAGNOSTIC STUDIES: Oxygen Saturation is 99% on RA, normal by my interpretation.    COORDINATION OF CARE: 2:03 PM-Patient informed of current plan of treatment and evaluation and agrees with plan.    Labs Review Labs Reviewed  COMPREHENSIVE METABOLIC PANEL - Abnormal; Notable for the following:    Sodium 134 (*)    Potassium 3.3 (*)    BUN 4 (*)    All other components within normal limits  CBC WITH DIFFERENTIAL - Abnormal; Notable for the following:    WBC 12.9 (*)    Neutro Abs 9.3 (*)    All other components within normal limits  LIPASE, BLOOD   Imaging Review No results found.  EKG Interpretation   None       MDM   1. Abdominal pain    37 year old female with abdominal pain. I suspect she has peptic ulcer disease. Imaging available in emergency setting likely of little utility for further evaluation of this.I have a very low suspicion for acute surgical process. Very atypical symptoms for ACS. Her labs have been fairly unremarkable. Will treat presumptively with H2 blocker and Carafate. Patient is to followup with gastroenterology. Return precautions were discussed.  I personally preformed the services scribed in my presence. The recorded information has been reviewed is accurate. [name], MD.    [name], MD 04/19/13 [phone]

## 2013-04-19 HISTORY — PX: ESOPHAGOGASTRODUODENOSCOPY: SHX1529

## 2013-04-23 ENCOUNTER — Ambulatory Visit: Payer: Self-pay | Admitting: Gastroenterology

## 2013-05-15 ENCOUNTER — Ambulatory Visit: Payer: Self-pay | Admitting: Gastroenterology

## 2013-05-16 LAB — PATHOLOGY REPORT

## 2013-06-17 HISTORY — PX: COLONOSCOPY: SHX174

## 2013-06-28 ENCOUNTER — Emergency Department: Payer: Self-pay | Admitting: Emergency Medicine

## 2013-06-28 ENCOUNTER — Ambulatory Visit: Payer: Self-pay | Admitting: Gastroenterology

## 2013-06-28 LAB — URINALYSIS, COMPLETE
Bilirubin,UR: NEGATIVE
Blood: NEGATIVE
Glucose,UR: NEGATIVE mg/dL (ref 0–75)
Ketone: NEGATIVE
Leukocyte Esterase: NEGATIVE
Nitrite: NEGATIVE
PH: 7 (ref 4.5–8.0)
Protein: NEGATIVE
Specific Gravity: 1.005 (ref 1.003–1.030)
Squamous Epithelial: 5
WBC UR: 1 /HPF (ref 0–5)

## 2013-06-28 LAB — CBC
HCT: 34.3 % — ABNORMAL LOW (ref 35.0–47.0)
HGB: 11.6 g/dL — ABNORMAL LOW (ref 12.0–16.0)
MCH: 31.8 pg (ref 26.0–34.0)
MCHC: 33.7 g/dL (ref 32.0–36.0)
MCV: 94 fL (ref 80–100)
PLATELETS: 410 10*3/uL (ref 150–440)
RBC: 3.64 10*6/uL — ABNORMAL LOW (ref 3.80–5.20)
RDW: 14.4 % (ref 11.5–14.5)
WBC: 12.5 10*3/uL — ABNORMAL HIGH (ref 3.6–11.0)

## 2013-06-28 LAB — COMPREHENSIVE METABOLIC PANEL
AST: 18 U/L (ref 15–37)
Albumin: 2.8 g/dL — ABNORMAL LOW (ref 3.4–5.0)
Alkaline Phosphatase: 88 U/L
Anion Gap: 5 — ABNORMAL LOW (ref 7–16)
BUN: 3 mg/dL — ABNORMAL LOW (ref 7–18)
Bilirubin,Total: 0.1 mg/dL — ABNORMAL LOW (ref 0.2–1.0)
CALCIUM: 8.3 mg/dL — AB (ref 8.5–10.1)
CHLORIDE: 110 mmol/L — AB (ref 98–107)
Co2: 25 mmol/L (ref 21–32)
Creatinine: 0.7 mg/dL (ref 0.60–1.30)
EGFR (African American): >60
EGFR (Non-African Amer.): >60
Glucose: 77 mg/dL (ref 65–99)
OSMOLALITY: 275 (ref 275–301)
Potassium: 3.1 mmol/L — ABNORMAL LOW (ref 3.5–5.1)
SGPT (ALT): 16 U/L (ref 12–78)
Sodium: 140 mmol/L (ref 136–145)
TOTAL PROTEIN: 6.4 g/dL (ref 6.4–8.2)

## 2013-06-28 LAB — LIPASE, BLOOD: LIPASE: 126 U/L (ref 73–393)

## 2013-06-29 LAB — PATHOLOGY REPORT

## 2013-08-22 ENCOUNTER — Emergency Department (HOSPITAL_COMMUNITY): Payer: Medicaid Other

## 2013-08-22 ENCOUNTER — Emergency Department (HOSPITAL_COMMUNITY)
Admission: EM | Admit: 2013-08-22 | Discharge: 2013-08-23 | Disposition: A | Discharge status: Home / Self Care | Payer: Medicaid Other | Attending: Emergency Medicine | Admitting: Emergency Medicine

## 2013-08-22 ENCOUNTER — Encounter (HOSPITAL_COMMUNITY): Payer: Self-pay | Admitting: Emergency Medicine

## 2013-08-22 DIAGNOSIS — G8929 Other chronic pain: Secondary | ICD-10-CM | POA: Insufficient documentation

## 2013-08-22 DIAGNOSIS — Z3202 Encounter for pregnancy test, result negative: Secondary | ICD-10-CM | POA: Insufficient documentation

## 2013-08-22 DIAGNOSIS — Z8541 Personal history of malignant neoplasm of cervix uteri: Secondary | ICD-10-CM | POA: Insufficient documentation

## 2013-08-22 DIAGNOSIS — J069 Acute upper respiratory infection, unspecified: Secondary | ICD-10-CM | POA: Insufficient documentation

## 2013-08-22 DIAGNOSIS — Z8739 Personal history of other diseases of the musculoskeletal system and connective tissue: Secondary | ICD-10-CM | POA: Insufficient documentation

## 2013-08-22 DIAGNOSIS — F329 Major depressive disorder, single episode, unspecified: Secondary | ICD-10-CM | POA: Insufficient documentation

## 2013-08-22 DIAGNOSIS — IMO0002 Reserved for concepts with insufficient information to code with codable children: Secondary | ICD-10-CM | POA: Insufficient documentation

## 2013-08-22 DIAGNOSIS — R059 Cough, unspecified: Secondary | ICD-10-CM

## 2013-08-22 DIAGNOSIS — R05 Cough: Secondary | ICD-10-CM

## 2013-08-22 DIAGNOSIS — G40909 Epilepsy, unspecified, not intractable, without status epilepticus: Secondary | ICD-10-CM | POA: Insufficient documentation

## 2013-08-22 DIAGNOSIS — Y9289 Other specified places as the place of occurrence of the external cause: Secondary | ICD-10-CM | POA: Insufficient documentation

## 2013-08-22 DIAGNOSIS — F411 Generalized anxiety disorder: Secondary | ICD-10-CM | POA: Insufficient documentation

## 2013-08-22 DIAGNOSIS — F3289 Other specified depressive episodes: Secondary | ICD-10-CM | POA: Insufficient documentation

## 2013-08-22 DIAGNOSIS — M549 Dorsalgia, unspecified: Secondary | ICD-10-CM

## 2013-08-22 DIAGNOSIS — Z9889 Other specified postprocedural states: Secondary | ICD-10-CM | POA: Insufficient documentation

## 2013-08-22 DIAGNOSIS — Y9389 Activity, other specified: Secondary | ICD-10-CM | POA: Insufficient documentation

## 2013-08-22 DIAGNOSIS — F17200 Nicotine dependence, unspecified, uncomplicated: Secondary | ICD-10-CM | POA: Insufficient documentation

## 2013-08-22 LAB — URINALYSIS, ROUTINE W REFLEX MICROSCOPIC
Bilirubin Urine: NEGATIVE
Glucose, UA: NEGATIVE mg/dL
KETONES UR: NEGATIVE mg/dL
NITRITE: NEGATIVE
PROTEIN: NEGATIVE mg/dL
Specific Gravity, Urine: 1.015 (ref 1.005–1.030)
Urobilinogen, UA: 0.2 mg/dL (ref 0.0–1.0)
pH: 6.5 (ref 5.0–8.0)

## 2013-08-22 LAB — URINE MICROSCOPIC-ADD ON

## 2013-08-22 LAB — PREGNANCY, URINE: Preg Test, Ur: NEGATIVE

## 2013-08-22 MED ORDER — HYDROMORPHONE HCL PF 1 MG/ML IJ SOLN
1.0000 mg | Freq: Once | INTRAMUSCULAR | Status: AC
  Administered 2013-08-22: 1 mg via INTRAMUSCULAR
  Filled 2013-08-22: qty 1

## 2013-08-22 NOTE — ED Provider Notes (Signed)
CSN: [id number]     Arrival date & time 08/22/13  2037 History   First MD Initiated Contact with Patient 08/22/13 2245     Chief Complaint  Patient presents with  . URI  . Back Pain     (Consider location/radiation/quality/duration/timing/severity/associated sxs/prior Treatment) Patient is a 37 y.o. female presenting with URI and back pain. The history is provided by the patient.  URI Presenting symptoms: congestion, cough and rhinorrhea   Presenting symptoms: no ear pain, no facial pain, no fever and no sore throat   Congestion:    Location:  Nasal   Interferes with sleep: no     Interferes with eating/drinking: no   Cough:    Cough characteristics:  Productive   Sputum characteristics:  Yellow   Severity:  Moderate   Onset quality:  Gradual   Duration:  1 week   Timing:  Constant   Progression:  Unchanged   Chronicity:  New Severity:  Moderate Onset quality:  Gradual Timing:  Constant Progression:  Unchanged Chronicity:  New Associated symptoms: sneezing   Associated symptoms: no arthralgias, no headaches, no myalgias, no neck pain, no swollen glands and no wheezing   Back Pain Location:  Lumbar spine Quality:  Aching and shooting Radiates to:  R posterior upper leg, R knee and R thigh Pain severity:  Moderate Pain is:  Same all the time Onset quality:  Sudden Duration:  2 days Timing:  Constant Progression:  Unchanged Chronicity:  New Context: recent injury   Context comment:  Patient c/o "jarring injury" to her lower back that began while riding on a tractor.  She denies fall .  Relieved by:  Nothing Worsened by:  Bending, sitting, twisting, standing and movement Ineffective treatments:  None tried Associated symptoms: leg pain   Associated symptoms: no abdominal pain, no abdominal swelling, no bowel incontinence, no chest pain, no dysuria, no fever, no headaches, no numbness, no paresthesias, no pelvic pain, no perianal numbness, no tingling and no weakness     Patient with hx of chronic lower back pain c/o URI symptoms for one week and lower back pain that began 2 days ago after a "jarring" injury that occurred while riding a tractor.  She states that since the back injury, she has been intermittently incontinent of urine only with coughing, sneezing or laughing.  She states that she is otherwise able to hold her urine when needed and denies any bowel incontinence or retention.  She also denies numbness or weakness of her legs, hematuria, dysuria, or fever.      Past Medical History  Diagnosis Date  . Anxiety   . Depression   . Seizures     last seizure was 8 mo ago.   Marland Kitchen Herniated disc   . Chronic back pain   . Lupus   . Cancer     cervical CA/ Lukemia   Past Surgical History  Procedure Laterality Date  . Cholecystectomy    . Cesarean section    . Bone marrow transplant    . Back surgery  06/29/11    L4-5 microdiskectomy  . Intrauterine device insertion    . Nose surgery     Family History  Problem Relation Age of Onset  . Diabetes Mother   . Cancer Mother     breast cancer  . Melanoma Father   . Diabetes Maternal Grandfather   . Stroke Maternal Grandfather   . Arthritis     History  Substance Use Topics  .  Smoking status: Current Every Day Smoker -- 0.25 packs/day    Types: Cigarettes  . Smokeless tobacco: Never Used  . Alcohol Use: No   OB History   Grav Para Term Preterm Abortions TAB SAB Ect Mult Living                 Review of Systems  Constitutional: Negative for fever, chills, activity change and appetite change.  HENT: Positive for congestion, rhinorrhea and sneezing. Negative for ear pain, sore throat and trouble swallowing.   Respiratory: Positive for cough. Negative for chest tightness, shortness of breath and wheezing.   Cardiovascular: Negative for chest pain.  Gastrointestinal: Negative for nausea, vomiting, abdominal pain, constipation and bowel incontinence.  Genitourinary: Negative for dysuria,  frequency, hematuria, flank pain, decreased urine volume, difficulty urinating and pelvic pain.       No perineal numbness or incontinence of urine or feces  Musculoskeletal: Positive for back pain. Negative for arthralgias, gait problem, joint swelling, myalgias and neck pain.  Skin: Negative for rash and wound.  Neurological: Negative for tingling, weakness, numbness, headaches and paresthesias.  All other systems reviewed and are negative.     Allergies  Oxycodone  Home Medications   Prior to Admission medications   Medication Sig Start Date End Date Taking? Authorizing Provider  acetaminophen (TYLENOL) 500 MG tablet Take 500 mg by mouth every 6 (six) hours as needed. pain    Historical Provider, MD  cetirizine (ZYRTEC) 10 MG tablet Take 10 mg by mouth daily.      Historical Provider, MD  clonazePAM (KLONOPIN) 2 MG tablet Take 2 mg by mouth 3 (three) times daily as needed for anxiety.    Historical Provider, MD  DULoxetine (CYMBALTA) 30 MG capsule Take 30-60 mg by mouth 2 (two) times daily. Take 2 capsules in am and 1 capsule in pm    Historical Provider, MD  famotidine (PEPCID) 20 MG tablet Take 1 tablet (20 mg total) by mouth 2 (two) times daily. 04/14/13   Virgel Manifold, MD  medroxyPROGESTERone (DEPO-PROVERA) 150 MG/ML injection Inject 150 mg into the muscle every 3 (three) months.    Historical Provider, MD  ondansetron (ZOFRAN) 4 MG tablet Take 1 tablet (4 mg total) by mouth every 6 (six) hours. 04/14/13   Virgel Manifold, MD  OXcarbazepine (TRILEPTAL) 150 MG tablet Take 150 mg by mouth at bedtime.      Historical Provider, MD  ranitidine (ZANTAC) 150 MG tablet Take 150 mg by mouth daily.    Historical Provider, MD  sucralfate (CARAFATE) 1 G tablet Take 1 tablet (1 g total) by mouth 4 (four) times daily -  with meals and at bedtime. 04/14/13   Virgel Manifold, MD  traMADol (ULTRAM) 50 MG tablet Take 1 tablet (50 mg total) by mouth every 6 (six) hours as needed. 04/14/13   Virgel Manifold, MD   BP 117/78  Pulse 106  Temp(Src) 98.2 F (36.8 C) (Oral)  Resp 20  Ht 5\' 5"  (1.651 m)  Wt 130 lb (58.968 kg)  BMI 21.63 kg/m2  SpO2 100% Physical Exam  Nursing note and vitals reviewed. Constitutional: She is oriented to person, place, and time. She appears well-developed and well-nourished. No distress.  HENT:  Head: Normocephalic and atraumatic.  Mouth/Throat: Oropharynx is clear and moist.  Neck: Normal range of motion. Neck supple. No thyromegaly present.  Cardiovascular: Normal rate, regular rhythm, normal heart sounds and intact distal pulses.   No murmur heard. Pulmonary/Chest: Effort normal. No respiratory distress.  She has wheezes. She has no rales. She exhibits no tenderness.  Few expiratory wheezes.  No rales  Abdominal: Soft. She exhibits no distension and no mass. There is no tenderness. There is no rebound and no guarding.  Musculoskeletal: She exhibits tenderness. She exhibits no edema.       Lumbar back: She exhibits tenderness and pain. She exhibits normal range of motion, no swelling, no deformity, no laceration and normal pulse.  ttp of the lumbar spine and right paraspinal muscles. Well healed surgical scar.   DP pulses are brisk and symmetrical.  Distal sensation intact.  Hip Flexors/Extensors are intact.  Pt has normal strength against resistance of bilateral lower extremities.     Lymphadenopathy:    She has no cervical adenopathy.  Neurological: She is alert and oriented to person, place, and time. She has normal strength. No sensory deficit. She exhibits normal muscle tone. Coordination and gait normal.  Reflex Scores:      Patellar reflexes are 2+ on the right side and 2+ on the left side.      Achilles reflexes are 2+ on the right side and 2+ on the left side. Skin: Skin is warm and dry. No rash noted.    ED Course  Procedures (including critical care time) Labs Review Labs Reviewed  URINALYSIS, ROUTINE W REFLEX MICROSCOPIC - Abnormal;  Notable for the following:    Hgb urine dipstick LARGE (*)    Leukocytes, UA SMALL (*)    All other components within normal limits  URINE MICROSCOPIC-ADD ON - Abnormal; Notable for the following:    Squamous Epithelial / LPF FEW (*)    Bacteria, UA FEW (*)    All other components within normal limits  URINE CULTURE  PREGNANCY, URINE    Imaging Review Dg Lumbar Spine Complete  08/23/2013   CLINICAL DATA:  Back pain  EXAM: LUMBAR SPINE - COMPLETE 4+ VIEW  COMPARISON:  DG ABDOMEN 2V dated 04/16/2013  FINDINGS: There are 5 nonrib bearing lumbar-type vertebral bodies. The vertebral body heights are maintained. The alignment is anatomic. There is no spondylolysis. There is no acute fracture or static listhesis. There is degenerative disc disease at L4-5.  The SI joints are unremarkable.  IMPRESSION: Degenerative disc disease at L4-5. No acute osseous injury of the lumbar spine.   Electronically Signed   By: Kathreen Devoid   On: 08/23/2013 00:08     EKG Interpretation None      MDM   Final diagnoses:  Cough  Back pain    Pt is well appearing, non-toxic.  VSS. No focal neuro deficits on exam.  Incontinence of urine is c/w stress incontinence and is intermittent, no concerning sx's for cauda quina syndrome.  Likely sciatica.  Pt also has cough.  She agrees to tx with zithromax, tessalon, inhaler, percocet and flexeril.       Pt is feeling better after medications and appears stable for d/c.  She agrees to return for any worsening sx's.        Lauren Aja L. Vanessa [place], PA-C 08/25/13 1241
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
## 2013-08-23 MED ORDER — BENZONATATE 100 MG PO CAPS: 200.0000 mg | ORAL_CAPSULE | Freq: Three times a day (TID) | ORAL | Status: DC | PRN

## 2013-08-23 MED ORDER — AZITHROMYCIN 250 MG PO TABS: ORAL_TABLET | ORAL | Status: DC

## 2013-08-23 MED ORDER — CYCLOBENZAPRINE HCL 10 MG PO TABS: 10.0000 mg | ORAL_TABLET | Freq: Three times a day (TID) | ORAL | Status: DC | PRN

## 2013-08-23 MED ORDER — OXYCODONE-ACETAMINOPHEN 5-325 MG PO TABS: 1.0000 | ORAL_TABLET | ORAL | Status: DC | PRN

## 2013-08-23 MED ORDER — ALBUTEROL SULFATE HFA 108 (90 BASE) MCG/ACT IN AERS: 1.0000 | INHALATION_SPRAY | Freq: Four times a day (QID) | RESPIRATORY_TRACT | Status: DC | PRN

## 2013-08-23 MED ORDER — HYDROMORPHONE HCL PF 1 MG/ML IJ SOLN
1.0000 mg | Freq: Once | INTRAMUSCULAR | Status: AC
  Administered 2013-08-23: 1 mg via INTRAMUSCULAR
  Filled 2013-08-23: qty 1

## 2013-08-23 NOTE — Discharge Instructions (Signed)
Cough, Adult  A cough is a reflex. It helps you clear your throat and airways. A cough can help heal your body. A cough can last 2 or 3 weeks (acute) or may last more than 8 weeks (chronic). Some common causes of a cough can include an infection, allergy, or a cold. HOME CARE  Only take medicine as told by your doctor.  If given, take your medicines (antibiotics) as told. Finish them even if you start to feel better.  Use a cold steam vaporizer or humidier in your home. This can help loosen thick spit (secretions).  Sleep so you are almost sitting up (semi-upright). Use pillows to do this. This helps reduce coughing.  Rest as needed.  Stop smoking if you smoke. GET HELP RIGHT AWAY IF:  You have yellowish-white fluid (pus) in your thick spit.  Your cough gets worse.  Your medicine does not reduce coughing, and you are losing sleep.  You cough up blood.  You have trouble breathing.  Your pain gets worse and medicine does not help.  You have a fever. MAKE SURE YOU:   Understand these instructions.  Will watch your condition.  Will get help right away if you are not doing well or get worse. Document Released: 12/17/2010 Document Revised: 06/28/2011 Document Reviewed: 12/17/2010 [facility] Patient Information 2014 [place].  Back Pain, Adult Back pain is very common. The pain often gets better over time. The cause of back pain is usually not dangerous. Most people can learn to manage their back pain on their own.  HOME CARE   Stay active. Start with short walks on flat ground if you can. Try to walk farther each day.  Do not sit, drive, or stand in one place for more than 30 minutes. Do not stay in bed.  Do not avoid exercise or work. Activity can help your back heal faster.  Be careful when you bend or lift an object. Bend at your knees, keep the object close to you, and do not twist.  Sleep on a firm mattress. Lie on your side, and bend your knees. If you lie on  your back, put a pillow under your knees.  Only take medicines as told by your doctor.  Put ice on the injured area.  Put ice in a plastic bag.  Place a towel between your skin and the bag.  Leave the ice on for 15-20 minutes, 03-04 times a day for the first 2 to 3 days. After that, you can switch between ice and heat packs.  Ask your doctor about back exercises or massage.  Avoid feeling anxious or stressed. Find good ways to deal with stress, such as exercise. GET HELP RIGHT AWAY IF:   Your pain does not go away with rest or medicine.  Your pain does not go away in 1 week.  You have new problems.  You do not feel well.  The pain spreads into your legs.  You cannot control when you poop (bowel movement) or pee (urinate).  Your arms or legs feel weak or lose feeling (numbness).  You feel sick to your stomach (nauseous) or throw up (vomit).  You have belly (abdominal) pain.  You feel like you may pass out (faint). MAKE SURE YOU:   Understand these instructions.  Will watch your condition.  Will get help right away if you are not doing well or get worse. Document Released: 09/22/2007 Document Revised: 06/28/2011 Document Reviewed: 08/24/2010 [facility] Patient Information 2014 [place].

## 2013-08-24 LAB — URINE CULTURE
COLONY COUNT: NO GROWTH
CULTURE: NO GROWTH

## 2013-08-27 NOTE — ED Provider Notes (Signed)
Medical screening examination/treatment/procedure(s) were performed by non-physician practitioner and as supervising physician I was immediately available for consultation/collaboration.   EKG Interpretation None        Mervin Kung, MD 08/27/13 0430

## 2013-12-22 ENCOUNTER — Emergency Department (HOSPITAL_COMMUNITY): Payer: Medicaid Other

## 2013-12-22 ENCOUNTER — Emergency Department (HOSPITAL_COMMUNITY)
Admission: EM | Admit: 2013-12-22 | Discharge: 2013-12-22 | Disposition: A | Discharge status: Home / Self Care | Payer: Medicaid Other | Attending: Emergency Medicine | Admitting: Emergency Medicine

## 2013-12-22 ENCOUNTER — Encounter (HOSPITAL_COMMUNITY): Payer: Self-pay | Admitting: Emergency Medicine

## 2013-12-22 DIAGNOSIS — F3289 Other specified depressive episodes: Secondary | ICD-10-CM | POA: Diagnosis not present

## 2013-12-22 DIAGNOSIS — Z79899 Other long term (current) drug therapy: Secondary | ICD-10-CM | POA: Diagnosis not present

## 2013-12-22 DIAGNOSIS — S8990XA Unspecified injury of unspecified lower leg, initial encounter: Secondary | ICD-10-CM | POA: Diagnosis not present

## 2013-12-22 DIAGNOSIS — F329 Major depressive disorder, single episode, unspecified: Secondary | ICD-10-CM | POA: Insufficient documentation

## 2013-12-22 DIAGNOSIS — Y92410 Unspecified street and highway as the place of occurrence of the external cause: Secondary | ICD-10-CM | POA: Insufficient documentation

## 2013-12-22 DIAGNOSIS — IMO0002 Reserved for concepts with insufficient information to code with codable children: Secondary | ICD-10-CM | POA: Insufficient documentation

## 2013-12-22 DIAGNOSIS — S99919A Unspecified injury of unspecified ankle, initial encounter: Secondary | ICD-10-CM | POA: Diagnosis not present

## 2013-12-22 DIAGNOSIS — Z856 Personal history of leukemia: Secondary | ICD-10-CM | POA: Diagnosis not present

## 2013-12-22 DIAGNOSIS — F17200 Nicotine dependence, unspecified, uncomplicated: Secondary | ICD-10-CM | POA: Diagnosis not present

## 2013-12-22 DIAGNOSIS — G40909 Epilepsy, unspecified, not intractable, without status epilepticus: Secondary | ICD-10-CM | POA: Insufficient documentation

## 2013-12-22 DIAGNOSIS — G8929 Other chronic pain: Secondary | ICD-10-CM | POA: Insufficient documentation

## 2013-12-22 DIAGNOSIS — Y9389 Activity, other specified: Secondary | ICD-10-CM | POA: Diagnosis not present

## 2013-12-22 DIAGNOSIS — S46909A Unspecified injury of unspecified muscle, fascia and tendon at shoulder and upper arm level, unspecified arm, initial encounter: Secondary | ICD-10-CM | POA: Diagnosis present

## 2013-12-22 DIAGNOSIS — Z8739 Personal history of other diseases of the musculoskeletal system and connective tissue: Secondary | ICD-10-CM | POA: Diagnosis not present

## 2013-12-22 DIAGNOSIS — Z8541 Personal history of malignant neoplasm of cervix uteri: Secondary | ICD-10-CM | POA: Diagnosis not present

## 2013-12-22 DIAGNOSIS — F411 Generalized anxiety disorder: Secondary | ICD-10-CM | POA: Diagnosis not present

## 2013-12-22 DIAGNOSIS — S4980XA Other specified injuries of shoulder and upper arm, unspecified arm, initial encounter: Secondary | ICD-10-CM | POA: Insufficient documentation

## 2013-12-22 DIAGNOSIS — S199XXA Unspecified injury of neck, initial encounter: Secondary | ICD-10-CM | POA: Diagnosis not present

## 2013-12-22 DIAGNOSIS — S0993XA Unspecified injury of face, initial encounter: Secondary | ICD-10-CM | POA: Insufficient documentation

## 2013-12-22 DIAGNOSIS — S99929A Unspecified injury of unspecified foot, initial encounter: Secondary | ICD-10-CM

## 2013-12-22 MED ORDER — NAPROXEN SODIUM 220 MG PO TABS: 440.0000 mg | ORAL_TABLET | Freq: Two times a day (BID) | ORAL | Status: DC

## 2013-12-22 MED ORDER — DIAZEPAM 5 MG PO TABS
5.0000 mg | ORAL_TABLET | Freq: Once | ORAL | Status: AC
  Administered 2013-12-22: 5 mg via ORAL
  Filled 2013-12-22: qty 1

## 2013-12-22 MED ORDER — KETOROLAC TROMETHAMINE 30 MG/ML IJ SOLN
30.0000 mg | Freq: Once | INTRAMUSCULAR | Status: AC
  Administered 2013-12-22: 30 mg via INTRAVENOUS
  Filled 2013-12-22: qty 1
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
## 2013-12-22 NOTE — ED Notes (Signed)
MD at bedside. 

## 2014-01-16 ENCOUNTER — Other Ambulatory Visit (HOSPITAL_COMMUNITY): Payer: Self-pay | Admitting: Family Medicine

## 2014-01-16 DIAGNOSIS — Z139 Encounter for screening, unspecified: Secondary | ICD-10-CM

## 2014-01-23 ENCOUNTER — Ambulatory Visit (HOSPITAL_COMMUNITY)
Admission: RE | Admit: 2014-01-23 | Discharge: 2014-01-23 | Disposition: A | Discharge status: Home / Self Care | Payer: Medicaid Other | Source: Ambulatory Visit | Attending: Family Medicine | Admitting: Family Medicine

## 2014-01-23 DIAGNOSIS — Z1231 Encounter for screening mammogram for malignant neoplasm of breast: Secondary | ICD-10-CM | POA: Diagnosis not present

## 2014-01-23 DIAGNOSIS — Z139 Encounter for screening, unspecified: Secondary | ICD-10-CM

## 2014-03-05 ENCOUNTER — Encounter: Payer: Self-pay | Admitting: Internal Medicine

## 2014-03-11 ENCOUNTER — Ambulatory Visit: Payer: Self-pay | Admitting: Family Medicine

## 2014-04-17 ENCOUNTER — Ambulatory Visit: Payer: Medicaid Other | Admitting: Gastroenterology

## 2014-05-01 ENCOUNTER — Encounter: Payer: Self-pay | Admitting: Gastroenterology

## 2014-05-01 ENCOUNTER — Telehealth: Payer: Self-pay | Admitting: Gastroenterology

## 2014-05-01 ENCOUNTER — Ambulatory Visit: Payer: Medicaid Other | Admitting: Gastroenterology

## 2014-05-01 NOTE — Telephone Encounter (Signed)
PATIENT WAS A NO SHOW 05/01/14 AND LETTER WAS SENT

## 2014-05-06 ENCOUNTER — Other Ambulatory Visit (HOSPITAL_COMMUNITY): Payer: Self-pay | Admitting: Orthopedic Surgery

## 2014-05-06 DIAGNOSIS — M25561 Pain in right knee: Secondary | ICD-10-CM

## 2014-05-07 ENCOUNTER — Other Ambulatory Visit (HOSPITAL_COMMUNITY): Payer: Self-pay | Admitting: Orthopedic Surgery

## 2014-05-07 DIAGNOSIS — M25561 Pain in right knee: Secondary | ICD-10-CM

## 2014-05-13 ENCOUNTER — Ambulatory Visit (HOSPITAL_COMMUNITY): Payer: Medicaid Other

## 2014-05-20 ENCOUNTER — Ambulatory Visit (HOSPITAL_COMMUNITY)
Admission: RE | Admit: 2014-05-20 | Discharge: 2014-05-20 | Disposition: A | Discharge status: Home / Self Care | Payer: Medicaid Other | Source: Ambulatory Visit | Attending: Orthopedic Surgery | Admitting: Orthopedic Surgery

## 2014-05-20 DIAGNOSIS — M25561 Pain in right knee: Secondary | ICD-10-CM | POA: Diagnosis present

## 2014-05-20 DIAGNOSIS — M7121 Synovial cyst of popliteal space [Baker], right knee: Secondary | ICD-10-CM | POA: Diagnosis not present

## 2014-05-20 DIAGNOSIS — M659 Synovitis and tenosynovitis, unspecified: Secondary | ICD-10-CM | POA: Insufficient documentation

## 2014-05-20 DIAGNOSIS — M94261 Chondromalacia, right knee: Secondary | ICD-10-CM | POA: Insufficient documentation

## 2014-06-18 ENCOUNTER — Encounter (HOSPITAL_COMMUNITY): Payer: Self-pay | Admitting: Emergency Medicine

## 2014-06-18 ENCOUNTER — Emergency Department (HOSPITAL_COMMUNITY)
Admission: EM | Admit: 2014-06-18 | Discharge: 2014-06-18 | Disposition: A | Discharge status: Home / Self Care | Payer: Medicaid Other | Attending: Emergency Medicine | Admitting: Emergency Medicine

## 2014-06-18 DIAGNOSIS — Z7952 Long term (current) use of systemic steroids: Secondary | ICD-10-CM | POA: Insufficient documentation

## 2014-06-18 DIAGNOSIS — F419 Anxiety disorder, unspecified: Secondary | ICD-10-CM | POA: Insufficient documentation

## 2014-06-18 DIAGNOSIS — Z8541 Personal history of malignant neoplasm of cervix uteri: Secondary | ICD-10-CM | POA: Diagnosis not present

## 2014-06-18 DIAGNOSIS — R52 Pain, unspecified: Secondary | ICD-10-CM | POA: Insufficient documentation

## 2014-06-18 DIAGNOSIS — M329 Systemic lupus erythematosus, unspecified: Secondary | ICD-10-CM | POA: Insufficient documentation

## 2014-06-18 DIAGNOSIS — Z79899 Other long term (current) drug therapy: Secondary | ICD-10-CM | POA: Diagnosis not present

## 2014-06-18 DIAGNOSIS — M1990 Unspecified osteoarthritis, unspecified site: Secondary | ICD-10-CM | POA: Insufficient documentation

## 2014-06-18 DIAGNOSIS — Z720 Tobacco use: Secondary | ICD-10-CM | POA: Insufficient documentation

## 2014-06-18 DIAGNOSIS — F329 Major depressive disorder, single episode, unspecified: Secondary | ICD-10-CM | POA: Diagnosis not present
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
## 2014-06-18 NOTE — Discharge Instructions (Signed)
Please discuss your pain concerns with the MD's and staff at [facility] until you can be seen at by pain management. The Emergency Dept is happy to assist with any Emergency, but not set up for pain management.

## 2014-06-18 NOTE — ED Notes (Signed)
Pt states that she is trying to get into pain management and they will not give her anything until they get her previous medical records.  States that she is hurting all over and that this has been going on for a while now.

## 2014-06-20 ENCOUNTER — Ambulatory Visit: Payer: Self-pay | Admitting: Otolaryngology

## 2014-07-02 ENCOUNTER — Emergency Department (HOSPITAL_COMMUNITY)
Admission: EM | Admit: 2014-07-02 | Discharge: 2014-07-02 | Disposition: A | Discharge status: Home / Self Care | Payer: Medicaid Other | Attending: Emergency Medicine | Admitting: Emergency Medicine

## 2014-07-02 ENCOUNTER — Encounter (HOSPITAL_COMMUNITY): Payer: Self-pay | Admitting: Emergency Medicine

## 2014-07-02 DIAGNOSIS — F329 Major depressive disorder, single episode, unspecified: Secondary | ICD-10-CM | POA: Insufficient documentation

## 2014-07-02 DIAGNOSIS — G8929 Other chronic pain: Secondary | ICD-10-CM | POA: Diagnosis not present

## 2014-07-02 DIAGNOSIS — Z9049 Acquired absence of other specified parts of digestive tract: Secondary | ICD-10-CM | POA: Diagnosis not present

## 2014-07-02 DIAGNOSIS — Z79899 Other long term (current) drug therapy: Secondary | ICD-10-CM | POA: Diagnosis not present

## 2014-07-02 DIAGNOSIS — F419 Anxiety disorder, unspecified: Secondary | ICD-10-CM | POA: Insufficient documentation

## 2014-07-02 DIAGNOSIS — R1084 Generalized abdominal pain: Secondary | ICD-10-CM | POA: Insufficient documentation

## 2014-07-02 DIAGNOSIS — M545 Low back pain: Secondary | ICD-10-CM | POA: Insufficient documentation

## 2014-07-02 DIAGNOSIS — Z8541 Personal history of malignant neoplasm of cervix uteri: Secondary | ICD-10-CM | POA: Insufficient documentation

## 2014-07-02 DIAGNOSIS — G40909 Epilepsy, unspecified, not intractable, without status epilepticus: Secondary | ICD-10-CM | POA: Insufficient documentation

## 2014-07-02 DIAGNOSIS — R6883 Chills (without fever): Secondary | ICD-10-CM | POA: Diagnosis not present

## 2014-07-02 DIAGNOSIS — R112 Nausea with vomiting, unspecified: Secondary | ICD-10-CM

## 2014-07-02 DIAGNOSIS — R197 Diarrhea, unspecified: Secondary | ICD-10-CM | POA: Diagnosis not present

## 2014-07-02 DIAGNOSIS — Z720 Tobacco use: Secondary | ICD-10-CM | POA: Diagnosis not present

## 2014-07-02 DIAGNOSIS — Z3202 Encounter for pregnancy test, result negative: Secondary | ICD-10-CM | POA: Insufficient documentation

## 2014-07-02 LAB — URINALYSIS, ROUTINE W REFLEX MICROSCOPIC
Bilirubin Urine: NEGATIVE
Glucose, UA: NEGATIVE mg/dL
Ketones, ur: NEGATIVE mg/dL
Leukocytes, UA: NEGATIVE
Nitrite: NEGATIVE
Protein, ur: NEGATIVE mg/dL
Specific Gravity, Urine: 1.01 (ref 1.005–1.030)
Urobilinogen, UA: 0.2 mg/dL (ref 0.0–1.0)
pH: >9 — ABNORMAL HIGH (ref 5.0–8.0)

## 2014-07-02 LAB — CBC WITH DIFFERENTIAL/PLATELET
Basophils Absolute: 0.1 10*3/uL (ref 0.0–0.1)
Basophils Relative: 0 % (ref 0–1)
Eosinophils Absolute: 0.1 10*3/uL (ref 0.0–0.7)
Eosinophils Relative: 1 % (ref 0–5)
HCT: 42 % (ref 36.0–46.0)
Hemoglobin: 14.3 g/dL (ref 12.0–15.0)
Lymphocytes Relative: 7 % — ABNORMAL LOW (ref 12–46)
Lymphs Abs: 1 10*3/uL (ref 0.7–4.0)
MCH: 31.7 pg (ref 26.0–34.0)
MCHC: 34 g/dL (ref 30.0–36.0)
MCV: 93.1 fL (ref 78.0–100.0)
Monocytes Absolute: 0.3 10*3/uL (ref 0.1–1.0)
Monocytes Relative: 2 % — ABNORMAL LOW (ref 3–12)
Neutro Abs: 12.4 10*3/uL — ABNORMAL HIGH (ref 1.7–7.7)
Neutrophils Relative %: 90 % — ABNORMAL HIGH (ref 43–77)
Platelets: 593 10*3/uL — ABNORMAL HIGH (ref 150–400)
RBC: 4.51 MIL/uL (ref 3.87–5.11)
RDW: 13.8 % (ref 11.5–15.5)
WBC: 13.8 10*3/uL — ABNORMAL HIGH (ref 4.0–10.5)

## 2014-07-02 LAB — COMPREHENSIVE METABOLIC PANEL
ALT: 26 U/L (ref 0–35)
AST: 24 U/L (ref 0–37)
Albumin: 3.8 g/dL (ref 3.5–5.2)
Alkaline Phosphatase: 79 U/L (ref 39–117)
Anion gap: 10 (ref 5–15)
BUN: 10 mg/dL (ref 6–23)
CO2: 31 mmol/L (ref 19–32)
CREATININE: 0.73 mg/dL (ref 0.50–1.10)
Calcium: 8.7 mg/dL (ref 8.4–10.5)
Chloride: 94 mmol/L — ABNORMAL LOW (ref 96–112)
GFR calc Af Amer: >90 mL/min (ref >90)
Glucose, Bld: 130 mg/dL — ABNORMAL HIGH (ref 70–99)
Potassium: 3.4 mmol/L — ABNORMAL LOW (ref 3.5–5.1)
Sodium: 135 mmol/L (ref 135–145)
Total Bilirubin: 0.5 mg/dL (ref 0.3–1.2)
Total Protein: 7.4 g/dL (ref 6.0–8.3)

## 2014-07-02 LAB — PREGNANCY, URINE: Preg Test, Ur: NEGATIVE

## 2014-07-02 LAB — LIPASE, BLOOD: Lipase: 21 U/L (ref 11–59)

## 2014-07-02 LAB — URINE MICROSCOPIC-ADD ON

## 2014-07-02 MED ORDER — PROMETHAZINE HCL 25 MG/ML IJ SOLN
12.5000 mg | Freq: Once | INTRAMUSCULAR | Status: AC
  Administered 2014-07-02: 12.5 mg via INTRAVENOUS

## 2014-07-02 MED ORDER — DICYCLOMINE HCL 20 MG PO TABS: 20.0000 mg | ORAL_TABLET | Freq: Two times a day (BID) | ORAL | Status: DC | PRN

## 2014-07-02 MED ORDER — PROMETHAZINE HCL 25 MG/ML IJ SOLN
INTRAMUSCULAR | Status: AC
  Filled 2014-07-02: qty 1

## 2014-07-02 MED ORDER — SODIUM CHLORIDE 0.9 % IV BOLUS (SEPSIS)
1000.0000 mL | Freq: Once | INTRAVENOUS | Status: AC
  Administered 2014-07-02: 1000 mL via INTRAVENOUS

## 2014-07-02 MED ORDER — SODIUM CHLORIDE 0.9 % IV BOLUS (SEPSIS)
2000.0000 mL | Freq: Once | INTRAVENOUS | Status: AC
  Administered 2014-07-02: 2000 mL via INTRAVENOUS

## 2014-07-02 MED ORDER — PROMETHAZINE HCL 25 MG/ML IJ SOLN
12.5000 mg | Freq: Once | INTRAMUSCULAR | Status: AC
  Administered 2014-07-02: 12.5 mg via INTRAVENOUS
  Filled 2014-07-02: qty 1

## 2014-07-02 MED ORDER — PROMETHAZINE HCL 25 MG RE SUPP: 25.0000 mg | Freq: Four times a day (QID) | RECTAL | Status: DC | PRN

## 2014-07-02 NOTE — ED Notes (Signed)
Patient with no complaints at this time. Respirations even and unlabored. Skin warm/dry. Discharge instructions reviewed with patient at this time. Patient given opportunity to voice concerns/ask questions. IV removed per policy and band-aid applied to site. Patient discharged at this time and left Emergency Department with steady gait.  

## 2014-07-02 NOTE — ED Notes (Signed)
Patient complaining of diffuse abdominal pain and bilateral flank pain x 3 days. Reports nausea and vomiting for about 24 hours. States started having diarrhea this morning as well.

## 2014-07-02 NOTE — Discharge Instructions (Signed)

## 2014-07-02 NOTE — ED Notes (Signed)
EMS gave patient 4 mg of zofran IV PTA but patient reports no relief.

## 2014-07-02 NOTE — ED Provider Notes (Signed)
CSN: [id number]     Arrival date & time 07/02/14  0348 History   First MD Initiated Contact with Patient 07/02/14 0358     Chief Complaint  Patient presents with  . Abdominal Pain  . Emesis     (Consider location/radiation/quality/duration/timing/severity/associated sxs/prior Treatment) HPI Patient presents with 24 hours of nausea and vomiting. She states she's vomited multiple times. She had one episode of loose stool this evening. She complains of diffuse, "moving" pain throughout her abdomen radiating into her bilateral flanks. No blood in vomit or stool. No subjective fevers. Patient does admit to diaphoresis and gooseflesh. Denies urinary symptoms. Past Medical History  Diagnosis Date  . Anxiety   . Depression   . Seizures     last seizure was 8 mo ago.   Marland Kitchen Herniated disc   . Chronic back pain   . Lupus   . Cancer     cervical CA/ Lukemia   Past Surgical History  Procedure Laterality Date  . Cholecystectomy    . Cesarean section    . Bone marrow transplant    . Back surgery  06/29/11    L4-5 microdiskectomy  . Intrauterine device insertion    . Nose surgery     Family History  Problem Relation Age of Onset  . Diabetes Mother   . Cancer Mother     breast cancer  . Melanoma Father   . Diabetes Maternal Grandfather   . Stroke Maternal Grandfather   . Arthritis     History  Substance Use Topics  . Smoking status: Current Every Day Smoker -- 0.25 packs/day    Types: Cigarettes  . Smokeless tobacco: Never Used  . Alcohol Use: No   OB History    No data available     Review of Systems  Constitutional: Positive for chills. Negative for fever.  Respiratory: Negative for shortness of breath.   Cardiovascular: Negative for chest pain.  Gastrointestinal: Positive for nausea, vomiting, abdominal pain and diarrhea. Negative for constipation and blood in stool.  Genitourinary: Positive for flank pain. Negative for dysuria and hematuria.  Musculoskeletal: Negative  for neck pain and neck stiffness.  Skin: Negative for rash and wound.  Neurological: Negative for weakness, light-headedness, numbness and headaches.  All other systems reviewed and are negative.     Allergies  Hydromorphone; Morphine; and Oxycodone  Home Medications   Prior to Admission medications   Medication Sig Start Date End Date Taking? Authorizing Provider  albuterol (PROVENTIL HFA;VENTOLIN HFA) 108 (90 BASE) MCG/ACT inhaler Inhale 1-2 puffs into the lungs every 6 (six) hours as needed for wheezing or shortness of breath. 08/23/13  Yes Tammi Triplett, PA-C  alprazolam (XANAX) 2 MG tablet Take 2 mg by mouth 3 times/day as needed-between meals & bedtime.   Yes Historical Provider, MD  DULoxetine (CYMBALTA) 60 MG capsule Take 120 mg by mouth every morning.   Yes Historical Provider, MD  hydrochlorothiazide (MICROZIDE) 12.5 MG capsule Take 12.5 mg by mouth daily.   Yes Historical Provider, MD  ibuprofen (ADVIL,MOTRIN) 200 MG tablet Take 400 mg by mouth every 6 (six) hours as needed (pain).   Yes Historical Provider, MD  lansoprazole (PREVACID) 30 MG capsule Take 30 mg by mouth daily.    Yes Historical Provider, MD  lovastatin (MEVACOR) 20 MG tablet Take 20 mg by mouth at bedtime.   Yes Historical Provider, MD  medroxyPROGESTERone (DEPO-PROVERA) 150 MG/ML injection Inject 150 mg into the muscle every 3 (three) months.   Yes Historical  Provider, MD  OXcarbazepine (TRILEPTAL) 150 MG tablet Take 75-150 mg by mouth 2 (two) times daily. Takes one tablet in the morning and one-half tablet at bedtime   Yes Historical Provider, MD  rizatriptan (MAXALT) 10 MG tablet Take 10 mg by mouth as needed for migraine. May repeat in 2 hours if needed   Yes Historical Provider, MD  zolpidem (AMBIEN CR) 12.5 MG CR tablet Take 12.5 mg by mouth at bedtime as needed for sleep.   Yes Historical Provider, MD  dicyclomine (BENTYL) 20 MG tablet Take 1 tablet (20 mg total) by mouth 2 (two) times daily as needed for  spasms. 07/02/14   Julianne Rice, MD  naproxen sodium (ALEVE) 220 MG tablet Take 2 tablets (440 mg total) by mouth 2 (two) times daily. Patient not taking: Reported on 06/18/2014 12/22/13   Carmin Muskrat, MD  promethazine (PHENERGAN) 25 MG suppository Place 1 suppository (25 mg total) rectally every 6 (six) hours as needed for nausea or vomiting. 07/02/14   Julianne Rice, MD   BP 133/94 mmHg  Pulse 118  Temp(Src) 98.4 F (36.9 C) (Oral)  Resp 18  SpO2 100% Physical Exam  Constitutional: She is oriented to person, place, and time. She appears well-developed and well-nourished. No distress.  Patient is very well-appearing  HENT:  Head: Normocephalic and atraumatic.  Mouth/Throat: Oropharynx is clear and moist.  Eyes: EOM are normal. Pupils are equal, round, and reactive to light.  Neck: Normal range of motion. Neck supple.  Cardiovascular: Normal rate and regular rhythm.   Pulmonary/Chest: Effort normal and breath sounds normal. No respiratory distress. She has no wheezes. She has no rales.  Abdominal: Soft. Bowel sounds are normal. She exhibits no distension and no mass. There is tenderness (Very mild tenderness to palpation diffusely.). There is no rebound and no guarding.  Musculoskeletal: Normal range of motion. She exhibits no edema or tenderness.  Tenderness to palpation over the bilateral lumbar paraspinal muscles. No definite CVA tenderness bilaterally.  Neurological: She is alert and oriented to person, place, and time.  Skin: Skin is warm and dry. No rash noted. No erythema.  Psychiatric: She has a normal mood and affect. Her behavior is normal.  Nursing note and vitals reviewed.   ED Course  Procedures (including critical care time) Labs Review Labs Reviewed  CBC WITH DIFFERENTIAL/PLATELET - Abnormal; Notable for the following:    WBC 13.8 (*)    Platelets 593 (*)    Neutrophils Relative % 90 (*)    Neutro Abs 12.4 (*)    Lymphocytes Relative 7 (*)    Monocytes  Relative 2 (*)    All other components within normal limits  COMPREHENSIVE METABOLIC PANEL - Abnormal; Notable for the following:    Potassium 3.4 (*)    Chloride 94 (*)    Glucose, Bld 130 (*)    All other components within normal limits  URINALYSIS, ROUTINE W REFLEX MICROSCOPIC - Abnormal; Notable for the following:    pH >9.0 (*)    Hgb urine dipstick TRACE (*)    All other components within normal limits  URINE MICROSCOPIC-ADD ON - Abnormal; Notable for the following:    Squamous Epithelial / LPF MANY (*)    Bacteria, UA FEW (*)    All other components within normal limits  LIPASE, BLOOD  PREGNANCY, URINE    Imaging Review No results found.   EKG Interpretation None      MDM   Final diagnoses:  Non-intractable vomiting with nausea,  vomiting of unspecified type    Patient continues to vomit despite initial dose of antiemetic. We'll re-dose and attempt PO trial. Signed out to oncoming emergency physician pending disposition.   Julianne Rice, MD 07/09/14 [phone]

## 2014-07-30 DIAGNOSIS — L309 Dermatitis, unspecified: Secondary | ICD-10-CM | POA: Insufficient documentation

## 2014-07-30 DIAGNOSIS — D649 Anemia, unspecified: Secondary | ICD-10-CM | POA: Insufficient documentation

## 2014-07-30 DIAGNOSIS — G43909 Migraine, unspecified, not intractable, without status migrainosus: Secondary | ICD-10-CM | POA: Insufficient documentation

## 2014-07-30 DIAGNOSIS — B029 Zoster without complications: Secondary | ICD-10-CM | POA: Insufficient documentation

## 2014-08-27 ENCOUNTER — Ambulatory Visit (INDEPENDENT_AMBULATORY_CARE_PROVIDER_SITE_OTHER): Payer: Medicaid Other | Admitting: Gastroenterology

## 2014-08-27 ENCOUNTER — Encounter: Payer: Self-pay | Admitting: Gastroenterology

## 2014-08-27 VITALS — BP 124/90 | HR 99 | Temp 97.1°F | Ht 65.0 in | Wt 142.0 lb

## 2014-08-27 DIAGNOSIS — Z86010 Personal history of colonic polyps: Secondary | ICD-10-CM

## 2014-08-27 DIAGNOSIS — R7989 Other specified abnormal findings of blood chemistry: Secondary | ICD-10-CM | POA: Diagnosis not present

## 2014-08-27 DIAGNOSIS — R1013 Epigastric pain: Secondary | ICD-10-CM

## 2014-08-27 DIAGNOSIS — K921 Melena: Secondary | ICD-10-CM

## 2014-08-27 DIAGNOSIS — R112 Nausea with vomiting, unspecified: Secondary | ICD-10-CM

## 2014-08-27 DIAGNOSIS — K5900 Constipation, unspecified: Secondary | ICD-10-CM | POA: Insufficient documentation

## 2014-08-27 DIAGNOSIS — Z860101 Personal history of adenomatous and serrated colon polyps: Secondary | ICD-10-CM | POA: Insufficient documentation

## 2014-08-27 DIAGNOSIS — R198 Other specified symptoms and signs involving the digestive system and abdomen: Secondary | ICD-10-CM

## 2014-08-27 DIAGNOSIS — K625 Hemorrhage of anus and rectum: Secondary | ICD-10-CM

## 2014-08-27 DIAGNOSIS — R945 Abnormal results of liver function studies: Secondary | ICD-10-CM | POA: Insufficient documentation

## 2014-08-27 LAB — CBC WITH DIFFERENTIAL/PLATELET
BASOS ABS: 0 10*3/uL (ref 0.0–0.1)
BASOS PCT: 0 % (ref 0–1)
EOS ABS: 0 10*3/uL (ref 0.0–0.7)
Eosinophils Relative: 0 % (ref 0–5)
HEMATOCRIT: 40 % (ref 36.0–46.0)
Hemoglobin: 13.6 g/dL (ref 12.0–15.0)
Lymphocytes Relative: 23 % (ref 12–46)
Lymphs Abs: 5.7 10*3/uL — ABNORMAL HIGH (ref 0.7–4.0)
MCH: 29.4 pg (ref 26.0–34.0)
MCHC: 34 g/dL (ref 30.0–36.0)
MCV: 86.6 fL (ref 78.0–100.0)
MPV: 9.8 fL (ref 8.6–12.4)
Monocytes Absolute: 1.5 10*3/uL — ABNORMAL HIGH (ref 0.1–1.0)
Monocytes Relative: 6 % (ref 3–12)
NEUTROS ABS: 17.7 10*3/uL — AB (ref 1.7–7.7)
NEUTROS PCT: 71 % (ref 43–77)
PLATELETS: 705 10*3/uL — AB (ref 150–400)
RBC: 4.62 MIL/uL (ref 3.87–5.11)
RDW: 15.1 % (ref 11.5–15.5)
WBC: 24.9 10*3/uL — AB (ref 4.0–10.5)

## 2014-08-27 LAB — COMPREHENSIVE METABOLIC PANEL
ALT: 22 U/L (ref 0–35)
AST: 18 U/L (ref 0–37)
Albumin: 4.1 g/dL (ref 3.5–5.2)
Alkaline Phosphatase: 82 U/L (ref 39–117)
BUN: 5 mg/dL — ABNORMAL LOW (ref 6–23)
CALCIUM: 10.2 mg/dL (ref 8.4–10.5)
CHLORIDE: 97 meq/L (ref 96–112)
CO2: 25 mEq/L (ref 19–32)
Creat: 0.71 mg/dL (ref 0.50–1.10)
Glucose, Bld: 71 mg/dL (ref 70–99)
Potassium: 4.3 mEq/L (ref 3.5–5.3)
SODIUM: 133 meq/L — AB (ref 135–145)
TOTAL PROTEIN: 7.3 g/dL (ref 6.0–8.3)
Total Bilirubin: 0.3 mg/dL (ref 0.2–1.2)

## 2014-08-27 LAB — TSH: TSH: 0.564 u[IU]/mL (ref 0.350–4.500)

## 2014-08-27 MED ORDER — PROMETHAZINE HCL 25 MG RE SUPP: 25.0000 mg | Freq: Four times a day (QID) | RECTAL | Status: DC | PRN

## 2014-08-27 MED ORDER — PROMETHAZINE HCL 25 MG PO TABS: 25.0000 mg | ORAL_TABLET | Freq: Four times a day (QID) | ORAL | Status: DC | PRN

## 2014-08-27 MED ORDER — LUBIPROSTONE 24 MCG PO CAPS: 24.0000 ug | ORAL_CAPSULE | Freq: Two times a day (BID) | ORAL | Status: DC

## 2014-08-27 MED ORDER — PANTOPRAZOLE SODIUM 40 MG PO TBEC: 40.0000 mg | DELAYED_RELEASE_TABLET | Freq: Every day | ORAL | Status: DC

## 2014-08-27 NOTE — Patient Instructions (Signed)
1. Please have your labs done. 2. Please stop Nexium once you receive your pantoprazole. You will take it once daily before breakfast.  3. Try Amitiza one with breakfast and one with supper. If you have diarrhea, you may drop back to once daily. 4. RX for phenergan tablets and suppositories sent to your pharmacy. You may take one or the other every four to six hours as needed for nausea.  5. Once we receive your records we will decide next step necessary in your evaluation.

## 2014-08-27 NOTE — Progress Notes (Addendum)
Primary Care Physician:  Vesta Mixer  Primary Gastroenterologist:  Garfield Cornea, MD   Chief Complaint  Patient presents with  . Abdominal Pain  . Emesis  . Labs Only    abnormal    HPI:  Lauren Cross is a 39 y.o. female here for further evaluation of abdominal pain, N/V, abnormal LFTs at request of her PCP. Patient initially referred back in 02/2014 for abnormal LFTs but no showed multiple times. She has been seen in the past at [facility] for GI issues. States she had EGD and TCS in early 2015. Reports having "golf ball" sized polyps in her colon that were not removed due to poor prep. Her last CT in 02/2014 was unremarkable, done for abnormal LFTs.   Patient reports that her LFTs returned to normal after stopping her Pravastatin. Did not tolerate Zetia due to throat swelling. Currently her hyperlipidemia is untreated.   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 She reports treatment for cervical cancer as a teenager, required conization. She reports having positive ANA and initially thought to have lupus. Now being worked up for [place] per patient. She has chronic back pain/neck pain and is scheduled to see pain management later this week but reports she will need to reschedule because she will not have a ride as her mother will be taking her father to have a lung biopsy.   Patient reports six week h/o recurrent vomiting associated with epigastric pain and bilateral sides. Abdominal pain seems to migrate as well. Similar symptoms over a years ago and see at [facility], records have been requested. States know explanation provided for her symptoms but reports having polyps that were not removed due to poor bowel prep.  Patient reports vomiting, epigastric pain unrelated to meals. +hematemesis. She reports she has had problems with her GI system since 2011 when she had emergent C-section at [redacted]  weeks gestation. She reports her bowel/bladder had to be displaced to perform the surgery.   Complains of frequent heartburn/regurgiation. +nocturnal. Also with complains of alternating constipation/diarrhea for multiple years. 50% of each. Takes stool softeners at times and Miralax without significant improvement. Reports rectal bleeding and melena, last melena, four days ago.   Takes ASA 325mg  daily. Ibuprofen 800mg  every 4 hours for back/neck pain. Has been off/on Nexium for years. protonix worked better for GERD. Failed omeprazole.     Current Outpatient Prescriptions  Medication Sig Dispense Refill  . albuterol (PROVENTIL HFA;VENTOLIN HFA) 108 (90 BASE) MCG/ACT inhaler Inhale 1-2 puffs into the lungs every 6 (six) hours as needed for wheezing or shortness of breath. 1 Inhaler 0  . alprazolam (XANAX) 2 MG tablet Take 2 mg by mouth 3 times/day as needed-between meals & bedtime.    . DULoxetine (CYMBALTA) 60 MG capsule Take 120 mg by mouth every morning.    . hydrochlorothiazide (MICROZIDE) 12.5 MG capsule Take 12.5 mg by mouth daily.    Marland Kitchen ibuprofen (ADVIL,MOTRIN) 200 MG tablet Take 800 mg by mouth every 4 (four) hours as needed (pain).     . Levomilnacipran HCl ER 20 MG CP24 Take 20 mg by mouth daily.    Marland Kitchen oxcarbazepine (TRILEPTAL) 600 MG tablet Takes 300mg  in am and 600mg  in pm    . potassium chloride (KLOR-CON) 20 MEQ packet Take 20 mEq by mouth once.    . rizatriptan (MAXALT) 10 MG tablet Take 10 mg by mouth as  needed for migraine. May repeat in 2 hours if needed    . zolpidem (AMBIEN CR) 12.5 MG CR tablet Take 12.5 mg by mouth at bedtime as needed for sleep.    Marland Kitchen  Nexium 40mg  daily ASA 325mg  daily.     .      .      .        No current facility-administered medications for this visit.    Allergies as of 08/27/2014 - Review Complete 08/27/2014  Allergen Reaction Noted  . Zetia [ezetimibe] Anaphylaxis 08/27/2014  . Hydromorphone Other (See Comments) 06/18/2014  . Morphine Nausea  And Vomiting 06/18/2014  . Oxycodone Other (See Comments) 06/19/2011  . Codeine Nausea Only 08/27/2014    Past Medical History  Diagnosis Date  . Anxiety   . Depression   . Seizures     last seizure was 8 mo ago.   Marland Kitchen Herniated disc   . Chronic back pain   . Cervical cancer     age 50  . Leukemia     age 43  . Other abnormality of brain or central nervous system function study     ?MS, work up in progress  . ANA positive     ???    Past Surgical History  Procedure Laterality Date  . Cholecystectomy    . Cesarean section    . Bone marrow transplant    . Back surgery  06/29/11    L4-5 microdiskectomy  . Intrauterine device insertion      removed  . Nose surgery    . Back surgery  2012    Family History  Problem Relation Age of Onset  . Diabetes Mother   . Cancer Maternal Grandmother     breast cancer  . Melanoma Father   . Diabetes Maternal Grandfather   . Stroke Maternal Grandfather   . Arthritis    . Colon cancer Neg Hx   . Skin cancer Mother     History   Social History  . Marital Status: Divorced    Spouse Name: N/A  . Number of Children: 2  . Years of Education: 14   Occupational History  . 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 Smoking status: Current Every Day Smoker -- 0.25 packs/day    Types: Cigarettes  . Smokeless tobacco: Never Used  . Alcohol Use: No  . Drug Use: No  . Sexual Activity: Yes    Birth Control/ Protection: Injection   Other Topics Concern  . Not on file   Social History Narrative      ROS:  General: Negative for anorexia, weight loss, fever, chills, fatigue, weakness. Eyes: Negative for vision changes.  ENT: Negative for hoarseness, difficulty swallowing , nasal congestion. CV: Negative for chest pain, angina, palpitations, dyspnea on exertion, peripheral edema.  Respiratory: Negative for dyspnea at rest, dyspnea on exertion, cough, sputum, wheezing.  GI: See history of  present illness. GU:  Negative for dysuria, hematuria, urinary incontinence, urinary frequency, nocturnal urination.  MS: Negative for joint pain, +neck/back pain.  Derm: Negative for rash or itching.  Neuro: Negative for weakness, abnormal sensation,   frequent headaches, memory loss, confusion. +seizures, under work-up Psych: Negative for anxiety, depression, suicidal ideation, hallucinations.  Endo: Negative for unusual weight change.  Heme: Negative for bruising or bleeding. Allergy: Negative for rash or hives.    Physical Examination:  BP 148/106 mmHg  Pulse 99  Temp(Src) 97.1 F (36.2 C)  Ht 5\' 5"  (1.651 m)  Wt 142 lb (64.411 kg)  BMI 23.63 kg/m2  LMP    General: Well-nourished, well-developed in no acute distress. Tearful. Accompanied by mother Head: Normocephalic, atraumatic.   Eyes: Conjunctiva pink, no icterus. Mouth: Oropharyngeal mucosa moist and pink , no lesions erythema or exudate. Neck: Supple without thyromegaly, masses, or lymphadenopathy.  Lungs: Clear to auscultation bilaterally.  Heart: Regular rate and rhythm, no murmurs rubs or gallops.  Abdomen: Bowel sounds are normal, moderate epigastric tenderness/LUQ tenderness but also mild diffuse tenderness, nondistended, no hepatosplenomegaly or masses, no abdominal bruits or    hernia , no rebound or guarding.   Rectal: declined Extremities: No lower extremity edema. No clubbing or deformities.  Neuro: Alert and oriented x 4 , grossly normal neurologically.  Skin: Warm and dry, no rash or jaundice.   Psych: Alert and cooperative, tearful/anxious  Labs: Lab Results  Component Value Date   WBC 13.8* 07/02/2014   HGB 14.3 07/02/2014   HCT 42.0 07/02/2014   MCV 93.1 07/02/2014   PLT 593* 07/02/2014   Lab Results  Component Value Date   CREATININE 0.73 07/02/2014   BUN 10 07/02/2014   NA 135 07/02/2014   K 3.4* 07/02/2014   CL 94* 07/02/2014   CO2 31 07/02/2014   Lab Results  Component Value Date    ALT 26 07/02/2014   AST 24 07/02/2014   ALKPHOS 79 07/02/2014   BILITOT 0.5 07/02/2014   Lab Results  Component Value Date   LIPASE 21 07/02/2014    Labs from PCP late 2015 total bilirubin 0.2, alkaline phosphatase 87, AST 56, ALT 156, albumin 3.3, reportedly hepatitis panel was done but I do not have those results. Also reportedly CT was done but I do not have those results. Imaging Studies: No results found.   Impression/Plan:  39 year old lady with complicated past medical history including leukemia, cervical cancer, chronic neck/back pain who presents for further evaluation of acute on chronic abdominal pain associated with recurrent nausea and vomiting. Original referral sent for abnormal LFTs but this has not been an issue since she came off of pravastatin. LFTs have been repeatedly normal since that time. Last CT imaging was in October November of last year was unremarkable with regards to have normal LFTs. Performed at outside facility.  For the past 2 months patient has had frequent vomiting associated with epigastric pain. Unrelated to meals. She's had hematemesis, melena. Admits to taking ibuprofen excessively, 800 mg every 4 hours chronically for back and neck pain. Takes a daily aspirin as well. Would be concerned for PUD. In addition to upper GI symptoms she has alternating constipation and diarrhea associated with rectal bleeding. Patient reports having an EGD and colonoscopy in early 2015 at the  clinic. We have requested results but have not received as of yet. She gives me a history of having poor bowel prep and incomplete colonoscopy. She states she had golfball sized polyp which was not removed. She has not been able to follow-up with them and actually preferred not to because she did not like the care that she received. May have IBS with benign anorectal bleeding but her reported history of colon polyps needs to be evaluated.   Initially plan on retrieving records  regarding prior endoscopic evaluation. Update labs including CBC, CMET, celiac screen, TSH. Ultimately likely will require EGD and colonoscopy but need to see report to determine what type of complications occurred. Patient is aware  if she develops fever, recurrent melena, significant rectal bleeding, lightheadedness that she should go straight to the emergency department.   Did go ahead and switch her from Nexium to pantoprazole as she has had better results in the past. Treat constipation with Amitiza 24 g 1-2 times daily with food but she was advised to monitor for diarrhea which time she may have stopped the medication. Further recommendations

## 2014-08-28 ENCOUNTER — Ambulatory Visit (HOSPITAL_COMMUNITY): Payer: Medicaid Other

## 2014-08-28 ENCOUNTER — Other Ambulatory Visit: Payer: Self-pay

## 2014-08-28 DIAGNOSIS — R1013 Epigastric pain: Secondary | ICD-10-CM | POA: Insufficient documentation

## 2014-08-28 DIAGNOSIS — K625 Hemorrhage of anus and rectum: Secondary | ICD-10-CM

## 2014-08-28 DIAGNOSIS — Z8541 Personal history of malignant neoplasm of cervix uteri: Secondary | ICD-10-CM

## 2014-08-28 DIAGNOSIS — Z86010 Personal history of colonic polyps: Secondary | ICD-10-CM

## 2014-08-28 LAB — IGA: IgA: 298 mg/dL (ref 69–380)

## 2014-08-28 NOTE — Progress Notes (Signed)
Quick Note:  Please let patient know that she has an elevated white blood cell count which may indicate inflammation or infection. Platelet count elevated which also coincides with this. Her LFTs are normal. Celiac screening pending. Thyroid labs normal.  Patient needs STAT CT abdomen and pelvis with IV and oral contrast. Indication for CT would be abdominal pain, vomiting, leukocytosis, history of cervical cancer/leukemia, GI bleeding ______

## 2014-08-28 NOTE — Progress Notes (Signed)
i talked with patient and she stated that she could not go today for the CT scan. She is going to go Friday to have it done.

## 2014-08-29 LAB — TISSUE TRANSGLUTAMINASE, IGA: Tissue Transglutaminase Ab, IgA: 1 U/mL (ref <4)

## 2014-08-29 NOTE — Progress Notes (Signed)
CC'ED TO PCP 

## 2014-09-04 NOTE — Progress Notes (Signed)
Quick Note:  Please contact patient. We have scheduled a STAT CT twice now and it appears patient did not go. We cannot help her if she is noncompliant with our recommendations. ______

## 2014-09-06 ENCOUNTER — Encounter: Payer: Self-pay | Admitting: Gastroenterology

## 2014-09-06 NOTE — Progress Notes (Signed)
Reviewed records received from [facility].  Colonoscopy in March 2015 by Dr. Arther Dames, with propofol. No mention of poor prep as patient had indicated Patient had a 20 mm sessile polyp removed from the proximal transverse colon with hot snare. A sessile polyp measuring 8 mm in the distal transverse colon was removed with hot snare. Terminal ileum was normal. Repeat colonoscopy in 6 months for surveillance after piecemeal polypectomy was recommended. 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 No high-grade dysplasia.  EGD January 2015 by Dr. Arther Dames, with propofol.  Exam was normal. Biopsies taken to check for H. pylori. Biopsies unremarkable.

## 2014-09-06 NOTE — Progress Notes (Signed)
Patient needs to follow through with her CT scan as scheduled previously.  After CT, we need to schedule her for colonoscopy with Dr. Gala Romney with deep sedation due to polypharmacy. Reason for surveillance colonoscopy, history of tubulovillous adenoma.  If she cannot be compliant with recommendations in a timely fashion, she will likely require additional office visit before her TCS.

## 2014-09-09 NOTE — Progress Notes (Signed)
Pt stated that her father had a heart attack and she has not been able to go have the CT scan because of that. She is going to try and go this Wednesday to get it done. She was wanting to know if she could have nausea medication. Please advise

## 2014-09-20 ENCOUNTER — Ambulatory Visit (HOSPITAL_COMMUNITY)
Admission: RE | Admit: 2014-09-20 | Discharge: 2014-09-20 | Disposition: A | Discharge status: Home / Self Care | Payer: Medicaid Other | Source: Ambulatory Visit | Attending: Gastroenterology | Admitting: Gastroenterology

## 2014-09-20 DIAGNOSIS — R1013 Epigastric pain: Secondary | ICD-10-CM | POA: Diagnosis not present

## 2014-09-20 DIAGNOSIS — K625 Hemorrhage of anus and rectum: Secondary | ICD-10-CM | POA: Diagnosis not present

## 2014-09-20 DIAGNOSIS — Z8541 Personal history of malignant neoplasm of cervix uteri: Secondary | ICD-10-CM | POA: Diagnosis not present

## 2014-09-20 DIAGNOSIS — R1110 Vomiting, unspecified: Secondary | ICD-10-CM | POA: Diagnosis not present

## 2014-09-20 DIAGNOSIS — Z86010 Personal history of colonic polyps: Secondary | ICD-10-CM

## 2014-09-20 MED ORDER — IOHEXOL 300 MG/ML  SOLN
100.0000 mL | Freq: Once | INTRAMUSCULAR | Status: AC | PRN
  Administered 2014-09-20: 100 mL via INTRAVENOUS

## 2014-09-23 ENCOUNTER — Other Ambulatory Visit: Payer: Self-pay

## 2014-09-23 DIAGNOSIS — R7989 Other specified abnormal findings of blood chemistry: Secondary | ICD-10-CM

## 2014-09-23 NOTE — Progress Notes (Signed)
Quick Note:  LMOM for a return call. Lab order on call for 2 weeks. ______

## 2014-09-23 NOTE — Progress Notes (Signed)
Quick Note:  Pt is aware. Lab order faxed to [facility]. OV 09/30/2014 at 9:00 AM with Walden Field, NP.  Zofran order called to Lytle Michaels at [facility]. ______

## 2014-09-23 NOTE — Progress Notes (Signed)
Quick Note:  Nothing to explain her abdominal pain. Two small lesions in the right kidney too small to characterize. I will ask Dr. Gala Romney if she needs follow up imaging in six months or so.  -For now, can give her Zofran 4mg  every four hours prn N/V. #30, no refills. Please send in. -She needs repeat CBC in 2 weeks. -Needs OV to schedule colonoscopy and possible upper endoscopy. ______

## 2014-09-25 ENCOUNTER — Inpatient Hospital Stay (HOSPITAL_COMMUNITY)
Admission: EM | Admit: 2014-09-25 | Discharge: 2014-09-28 | DRG: 641 | Disposition: A | Discharge status: Home / Self Care | Payer: Medicaid Other | Attending: Family Medicine | Admitting: Family Medicine

## 2014-09-25 ENCOUNTER — Telehealth: Payer: Self-pay | Admitting: General Practice

## 2014-09-25 ENCOUNTER — Encounter (HOSPITAL_COMMUNITY): Payer: Self-pay | Admitting: Emergency Medicine

## 2014-09-25 DIAGNOSIS — R197 Diarrhea, unspecified: Secondary | ICD-10-CM

## 2014-09-25 DIAGNOSIS — Z8541 Personal history of malignant neoplasm of cervix uteri: Secondary | ICD-10-CM | POA: Diagnosis not present

## 2014-09-25 DIAGNOSIS — R1013 Epigastric pain: Secondary | ICD-10-CM | POA: Diagnosis present

## 2014-09-25 DIAGNOSIS — K219 Gastro-esophageal reflux disease without esophagitis: Secondary | ICD-10-CM

## 2014-09-25 DIAGNOSIS — M549 Dorsalgia, unspecified: Secondary | ICD-10-CM | POA: Diagnosis present

## 2014-09-25 DIAGNOSIS — F418 Other specified anxiety disorders: Secondary | ICD-10-CM | POA: Diagnosis present

## 2014-09-25 DIAGNOSIS — Z833 Family history of diabetes mellitus: Secondary | ICD-10-CM | POA: Diagnosis not present

## 2014-09-25 DIAGNOSIS — Z856 Personal history of leukemia: Secondary | ICD-10-CM

## 2014-09-25 DIAGNOSIS — R112 Nausea with vomiting, unspecified: Secondary | ICD-10-CM | POA: Diagnosis present

## 2014-09-25 DIAGNOSIS — A084 Viral intestinal infection, unspecified: Secondary | ICD-10-CM | POA: Diagnosis present

## 2014-09-25 DIAGNOSIS — Z808 Family history of malignant neoplasm of other organs or systems: Secondary | ICD-10-CM | POA: Diagnosis not present

## 2014-09-25 DIAGNOSIS — G4700 Insomnia, unspecified: Secondary | ICD-10-CM | POA: Diagnosis present

## 2014-09-25 DIAGNOSIS — R1084 Generalized abdominal pain: Secondary | ICD-10-CM

## 2014-09-25 DIAGNOSIS — R339 Retention of urine, unspecified: Secondary | ICD-10-CM | POA: Diagnosis present

## 2014-09-25 DIAGNOSIS — Z803 Family history of malignant neoplasm of breast: Secondary | ICD-10-CM | POA: Diagnosis not present

## 2014-09-25 DIAGNOSIS — I10 Essential (primary) hypertension: Secondary | ICD-10-CM | POA: Diagnosis present

## 2014-09-25 DIAGNOSIS — G8929 Other chronic pain: Secondary | ICD-10-CM | POA: Diagnosis present

## 2014-09-25 DIAGNOSIS — E785 Hyperlipidemia, unspecified: Secondary | ICD-10-CM | POA: Diagnosis present

## 2014-09-25 DIAGNOSIS — Z823 Family history of stroke: Secondary | ICD-10-CM | POA: Diagnosis not present

## 2014-09-25 DIAGNOSIS — F17210 Nicotine dependence, cigarettes, uncomplicated: Secondary | ICD-10-CM | POA: Diagnosis present

## 2014-09-25 DIAGNOSIS — R109 Unspecified abdominal pain: Secondary | ICD-10-CM | POA: Diagnosis not present

## 2014-09-25 DIAGNOSIS — E876 Hypokalemia: Secondary | ICD-10-CM | POA: Diagnosis present

## 2014-09-25 DIAGNOSIS — E871 Hypo-osmolality and hyponatremia: Secondary | ICD-10-CM | POA: Diagnosis present

## 2014-09-25 DIAGNOSIS — B379 Candidiasis, unspecified: Secondary | ICD-10-CM | POA: Diagnosis present

## 2014-09-25 HISTORY — DX: Hyperlipidemia, unspecified: E78.5

## 2014-09-25 LAB — BASIC METABOLIC PANEL
Anion gap: 4 — ABNORMAL LOW (ref 5–15)
BUN: 5 mg/dL — AB (ref 6–20)
CO2: 27 mmol/L (ref 22–32)
Calcium: 7.5 mg/dL — ABNORMAL LOW (ref 8.9–10.3)
Chloride: 87 mmol/L — ABNORMAL LOW (ref 101–111)
Creatinine, Ser: 0.49 mg/dL (ref 0.44–1.00)
GFR calc Af Amer: >60 mL/min (ref >60)
GFR calc non Af Amer: >60 mL/min (ref >60)
Glucose, Bld: 82 mg/dL (ref 65–99)
Potassium: 3 mmol/L — ABNORMAL LOW (ref 3.5–5.1)
Sodium: 118 mmol/L — CL (ref 135–145)

## 2014-09-25 LAB — I-STAT CHEM 8, ED
BUN: <3 mg/dL — ABNORMAL LOW (ref 6–20)
CALCIUM ION: 1.05 mmol/L — AB (ref 1.12–1.23)
CHLORIDE: 83 mmol/L — AB (ref 101–111)
CREATININE: 0.6 mg/dL (ref 0.44–1.00)
GLUCOSE: 81 mg/dL (ref 65–99)
HEMATOCRIT: 35 % — AB (ref 36.0–46.0)
Hemoglobin: 11.9 g/dL — ABNORMAL LOW (ref 12.0–15.0)
Potassium: 2.7 mmol/L — CL (ref 3.5–5.1)
SODIUM: 120 mmol/L — AB (ref 135–145)
TCO2: 23 mmol/L (ref 0–100)

## 2014-09-25 LAB — COMPREHENSIVE METABOLIC PANEL
ALBUMIN: 3.3 g/dL — AB (ref 3.5–5.0)
ALT: 19 U/L (ref 14–54)
ANION GAP: 9 (ref 5–15)
AST: 22 U/L (ref 15–41)
Alkaline Phosphatase: 64 U/L (ref 38–126)
BILIRUBIN TOTAL: 0.4 mg/dL (ref 0.3–1.2)
BUN: 6 mg/dL (ref 6–20)
CALCIUM: 8.6 mg/dL — AB (ref 8.9–10.3)
CHLORIDE: 79 mmol/L — AB (ref 101–111)
CO2: 30 mmol/L (ref 22–32)
CREATININE: 0.59 mg/dL (ref 0.44–1.00)
GFR calc Af Amer: >60 mL/min (ref >60)
GFR calc non Af Amer: >60 mL/min (ref >60)
Glucose, Bld: 84 mg/dL (ref 65–99)
Potassium: 3 mmol/L — ABNORMAL LOW (ref 3.5–5.1)
Sodium: 118 mmol/L — CL (ref 135–145)
Total Protein: 6.3 g/dL — ABNORMAL LOW (ref 6.5–8.1)

## 2014-09-25 LAB — CBC
HCT: 34.3 % — ABNORMAL LOW (ref 36.0–46.0)
Hemoglobin: 11.8 g/dL — ABNORMAL LOW (ref 12.0–15.0)
MCH: 29.3 pg (ref 26.0–34.0)
MCHC: 34.4 g/dL (ref 30.0–36.0)
MCV: 85.1 fL (ref 78.0–100.0)
Platelets: 517 10*3/uL — ABNORMAL HIGH (ref 150–400)
RBC: 4.03 MIL/uL (ref 3.87–5.11)
RDW: 15.2 % (ref 11.5–15.5)
WBC: 10 10*3/uL (ref 4.0–10.5)

## 2014-09-25 LAB — CBC WITH DIFFERENTIAL/PLATELET
Basophils Absolute: 0.1 10*3/uL (ref 0.0–0.1)
Basophils Relative: 0 % (ref 0–1)
EOS ABS: 0.3 10*3/uL (ref 0.0–0.7)
EOS PCT: 3 % (ref 0–5)
HCT: 33.8 % — ABNORMAL LOW (ref 36.0–46.0)
HEMOGLOBIN: 11.8 g/dL — AB (ref 12.0–15.0)
Lymphocytes Relative: 32 % (ref 12–46)
Lymphs Abs: 3.5 10*3/uL (ref 0.7–4.0)
MCH: 29.5 pg (ref 26.0–34.0)
MCHC: 34.9 g/dL (ref 30.0–36.0)
MCV: 84.5 fL (ref 78.0–100.0)
Monocytes Absolute: 0.7 10*3/uL (ref 0.1–1.0)
Monocytes Relative: 7 % (ref 3–12)
NEUTROS ABS: 6.5 10*3/uL (ref 1.7–7.7)
Neutrophils Relative %: 58 % (ref 43–77)
Platelets: 569 10*3/uL — ABNORMAL HIGH (ref 150–400)
RBC: 4 MIL/uL (ref 3.87–5.11)
RDW: 15.2 % (ref 11.5–15.5)
WBC: 11.1 10*3/uL — ABNORMAL HIGH (ref 4.0–10.5)

## 2014-09-25 LAB — MAGNESIUM: MAGNESIUM: 1.7 mg/dL (ref 1.7–2.4)

## 2014-09-25 LAB — URINALYSIS, ROUTINE W REFLEX MICROSCOPIC
Bilirubin Urine: NEGATIVE
Glucose, UA: NEGATIVE mg/dL
Hgb urine dipstick: NEGATIVE
KETONES UR: NEGATIVE mg/dL
Leukocytes, UA: NEGATIVE
NITRITE: NEGATIVE
Protein, ur: NEGATIVE mg/dL
Specific Gravity, Urine: 1.015 (ref 1.005–1.030)
Urobilinogen, UA: 0.2 mg/dL (ref 0.0–1.0)
pH: 6.5 (ref 5.0–8.0)

## 2014-09-25 LAB — PREGNANCY, URINE: PREG TEST UR: NEGATIVE

## 2014-09-25 MED ORDER — SODIUM CHLORIDE 0.9 % IV SOLN
INTRAVENOUS | Status: DC
  Administered 2014-09-25 – 2014-09-27 (×4): via INTRAVENOUS

## 2014-09-25 MED ORDER — SODIUM CHLORIDE 0.9 % IV BOLUS (SEPSIS)
1000.0000 mL | Freq: Once | INTRAVENOUS | Status: AC
  Administered 2014-09-25: 1000 mL via INTRAVENOUS

## 2014-09-25 MED ORDER — ONDANSETRON HCL 4 MG/2ML IJ SOLN
4.0000 mg | Freq: Once | INTRAMUSCULAR | Status: AC
  Administered 2014-09-25: 4 mg via INTRAVENOUS
  Filled 2014-09-25: qty 2

## 2014-09-25 MED ORDER — LORAZEPAM 2 MG/ML IJ SOLN
1.0000 mg | Freq: Once | INTRAMUSCULAR | Status: AC
  Administered 2014-09-25: 1 mg via INTRAVENOUS
  Filled 2014-09-25: qty 1

## 2014-09-25 MED ORDER — DULOXETINE HCL 60 MG PO CPEP
120.0000 mg | ORAL_CAPSULE | Freq: Every morning | ORAL | Status: DC
  Administered 2014-09-26 – 2014-09-28 (×3): 120 mg via ORAL
  Filled 2014-09-25 (×3): qty 2

## 2014-09-25 MED ORDER — HEPARIN SODIUM (PORCINE) 5000 UNIT/ML IJ SOLN
5000.0000 [IU] | Freq: Three times a day (TID) | INTRAMUSCULAR | Status: DC
  Administered 2014-09-25 – 2014-09-28 (×9): 5000 [IU] via SUBCUTANEOUS
  Filled 2014-09-25 (×9): qty 1

## 2014-09-25 MED ORDER — ZOLPIDEM TARTRATE 5 MG PO TABS
5.0000 mg | ORAL_TABLET | Freq: Every evening | ORAL | Status: DC | PRN
  Administered 2014-09-26 – 2014-09-27 (×3): 5 mg via ORAL
  Filled 2014-09-25 (×3): qty 1

## 2014-09-25 MED ORDER — ACETAMINOPHEN 650 MG RE SUPP: 650.0000 mg | Freq: Four times a day (QID) | RECTAL | Status: DC | PRN

## 2014-09-25 MED ORDER — RISAQUAD PO CAPS
2.0000 | ORAL_CAPSULE | Freq: Every day | ORAL | Status: DC
  Administered 2014-09-25 – 2014-09-28 (×4): 2 via ORAL
  Filled 2014-09-25 (×4): qty 2

## 2014-09-25 MED ORDER — LUBIPROSTONE 24 MCG PO CAPS
24.0000 ug | ORAL_CAPSULE | Freq: Two times a day (BID) | ORAL | Status: DC
Start: 2014-09-26 — End: 2014-09-28
  Administered 2014-09-26 – 2014-09-28 (×6): 24 ug via ORAL
  Filled 2014-09-25 (×9): qty 1

## 2014-09-25 MED ORDER — TIMOLOL MALEATE 0.5 % OP SOLN: 1.0000 [drp] | Freq: Every day | OPHTHALMIC | Status: DC | PRN

## 2014-09-25 MED ORDER — ALPRAZOLAM 1 MG PO TABS
2.0000 mg | ORAL_TABLET | Freq: Three times a day (TID) | ORAL | Status: DC
  Administered 2014-09-25 – 2014-09-28 (×9): 2 mg via ORAL
  Filled 2014-09-25 (×9): qty 2

## 2014-09-25 MED ORDER — ACETAMINOPHEN 325 MG PO TABS
650.0000 mg | ORAL_TABLET | Freq: Four times a day (QID) | ORAL | Status: DC | PRN
  Administered 2014-09-26 – 2014-09-28 (×4): 650 mg via ORAL
  Filled 2014-09-25 (×4): qty 2

## 2014-09-25 MED ORDER — PROMETHAZINE HCL 25 MG/ML IJ SOLN
25.0000 mg | Freq: Once | INTRAMUSCULAR | Status: AC
  Administered 2014-09-25: 25 mg via INTRAVENOUS
  Filled 2014-09-25: qty 1

## 2014-09-25 MED ORDER — HYDROMORPHONE HCL 1 MG/ML IJ SOLN
INTRAMUSCULAR | Status: AC
  Filled 2014-09-25: qty 1

## 2014-09-25 MED ORDER — POTASSIUM CHLORIDE 10 MEQ/100ML IV SOLN
10.0000 meq | INTRAVENOUS | Status: AC
  Administered 2014-09-25 – 2014-09-26 (×7): 10 meq via INTRAVENOUS
  Filled 2014-09-25 (×2): qty 100

## 2014-09-25 MED ORDER — HYDRALAZINE HCL 20 MG/ML IJ SOLN: 5.0000 mg | INTRAMUSCULAR | Status: DC | PRN

## 2014-09-25 MED ORDER — HYDROMORPHONE HCL 1 MG/ML IJ SOLN
1.0000 mg | Freq: Once | INTRAMUSCULAR | Status: AC
  Administered 2014-09-25: 1 mg via INTRAVENOUS

## 2014-09-25 MED ORDER — ONDANSETRON HCL 4 MG PO TABS
8.0000 mg | ORAL_TABLET | Freq: Four times a day (QID) | ORAL | Status: DC | PRN
  Administered 2014-09-26: 8 mg via ORAL
  Filled 2014-09-25 (×3): qty 2

## 2014-09-25 MED ORDER — HYDROCODONE-ACETAMINOPHEN 5-325 MG PO TABS
2.0000 | ORAL_TABLET | Freq: Once | ORAL | Status: AC
  Administered 2014-09-26: 2 via ORAL
  Filled 2014-09-25: qty 2

## 2014-09-25 MED ORDER — SODIUM CHLORIDE 0.9 % IJ SOLN
3.0000 mL | Freq: Two times a day (BID) | INTRAMUSCULAR | Status: DC
  Administered 2014-09-26 – 2014-09-28 (×4): 3 mL via INTRAVENOUS

## 2014-09-25 MED ORDER — TIMOLOL HEMIHYDRATE 0.5 % OP SOLN: 1.0000 [drp] | Freq: Every day | OPHTHALMIC | Status: DC | PRN

## 2014-09-25 MED ORDER — SUMATRIPTAN SUCCINATE 50 MG PO TABS
100.0000 mg | ORAL_TABLET | ORAL | Status: DC | PRN
  Administered 2014-09-26 – 2014-09-28 (×4): 100 mg via ORAL
  Filled 2014-09-25: qty 2
  Filled 2014-09-25: qty 1
  Filled 2014-09-25: qty 2
  Filled 2014-09-25: qty 1
  Filled 2014-09-25: qty 2

## 2014-09-25 MED ORDER — FENTANYL CITRATE (PF) 100 MCG/2ML IJ SOLN
50.0000 ug | Freq: Once | INTRAMUSCULAR | Status: AC
  Administered 2014-09-25: 50 ug via INTRAVENOUS
  Filled 2014-09-25: qty 2

## 2014-09-25 MED ORDER — SODIUM CHLORIDE 0.9 % IV SOLN
Freq: Once | INTRAVENOUS | Status: AC
  Administered 2014-09-25: 19:00:00 via INTRAVENOUS

## 2014-09-25 MED ORDER — ONDANSETRON HCL 4 MG/2ML IJ SOLN
4.0000 mg | Freq: Once | INTRAMUSCULAR | Status: DC
  Filled 2014-09-25: qty 2

## 2014-09-25 MED ORDER — PANTOPRAZOLE SODIUM 40 MG PO TBEC
40.0000 mg | DELAYED_RELEASE_TABLET | Freq: Every day | ORAL | Status: DC
  Administered 2014-09-25 – 2014-09-28 (×4): 40 mg via ORAL
  Filled 2014-09-25 (×4): qty 1

## 2014-09-25 MED ORDER — SODIUM CHLORIDE 0.9 % IV SOLN
8.0000 mg | Freq: Four times a day (QID) | INTRAVENOUS | Status: DC | PRN
  Filled 2014-09-25: qty 4

## 2014-09-25 NOTE — H&P (Signed)
[facility] History and Physical  Aislee Landgren STM:[id number] DOB: 20-Jan-1976 DOA: 09/25/2014  Referring physician: Dr. Roderic Palau - [facility] PCP: Antionette Fairy, PA-C   Chief Complaint: Abd pain  HPI: Fey Coghill is a 39 y.o. female  Abdominal pain. Suprapubic in location, achy in nature. No radiation. Gradual onset. Current episode started 2 days ago. Associated w/ diarrhea. Imodium w/ some improvement. 10bms daily. Watery, non-bloody. Minimal urine output for 2 days.  Acute intermittent episodes of nonbilious nonbloody nausea and vomiting for the last 4-5 days.  Abd pain for several months w/ ongoing workup. Possible Dx of IBS. [facility]   2015 had EGD and Colonoscopy showing 3 polyps   Review of Systems:  Constitutional:  No weight loss, night sweats, Fevers, chills,.  HEENT:  No headaches, Difficulty swallowing,Tooth/dental problems,Sore throat,  No sneezing, itching, ear ache, nasal congestion, post nasal drip,  Cardio-vascular:  No chest pain, Orthopnea, PND, swelling in lower extremities, anasarca, dizziness, palpitations  GI: Per HPI Resp:   No shortness of breath with exertion or at rest. No excess mucus, no productive cough, No non-productive cough, No coughing up of blood.No change in color of mucus.No wheezing.No chest wall deformity  Skin:  no rash or lesions.  GU:  no dysuria, change in color of urine, no urgency or frequency. No flank pain.  Musculoskeletal:   No joint pain or swelling. No decreased range of motion. No back pain.  Psych:  No change in mood or affect. No depression or anxiety. No memory loss.   Past Medical History  Diagnosis Date  . Anxiety   . Depression   . Seizures     last seizure was 8 mo ago.   Marland Kitchen Herniated disc   . Chronic back pain   . Cervical cancer     age 2  . Leukemia     age 10  . Other abnormality of brain or central nervous system function study     ?MS, work up in progress  . ANA positive     ???  . HLD  (hyperlipidemia)    Past Surgical History  Procedure Laterality Date  . Cholecystectomy    . Cesarean section    . Bone marrow transplant    . Back surgery  06/29/11    L4-5 microdiskectomy  . Intrauterine device insertion      removed  . Nose surgery    . Back surgery  2012  . Colonoscopy  06/2013    Dr. Arther [facility]ames at [facility]: 20 mm sessile polyp removed from the proximal transverse colon, tubulovillous adenoma, 8 mm polyp from the distal transverse colon was tubular adenoma. No high-grade dysplasia. Recommended to have a six-month follow-up colonoscopy, she has not had this done.  . Esophagogastroduodenoscopy  January 2015    Dr. Arther [facility]ames at [facility]. normal . bx negative for H.pylori   Social History:  reports that she has been smoking Cigarettes.  She has been smoking about 0.25 packs per day. She has never used smokeless tobacco. She reports that she does not drink alcohol or use illicit drugs.  Allergies  Allergen Reactions  . Zetia [Ezetimibe] Anaphylaxis  . Hydromorphone Other (See Comments)    aggitation  . Morphine Nausea And Vomiting  . Oxycodone Other (See Comments)    hallucinations  . Codeine Nausea Only    aggitation    Family History  Problem Relation Age of Onset  . Diabetes Mother   . Cancer Maternal  Grandmother     breast cancer  . Melanoma Father   . Diabetes Maternal Grandfather   . Stroke Maternal Grandfather   . Arthritis    . Colon cancer Neg Hx   . Skin cancer Mother      Prior to Admission medications   Medication Sig Start Date End Date Taking? Authorizing Provider  albuterol (PROVENTIL HFA;VENTOLIN HFA) 108 (90 BASE) MCG/ACT inhaler Inhale 1-2 puffs into the lungs every 6 (six) hours as needed for wheezing or shortness of breath. 08/23/13  Yes Tammy Triplett, PA-C  alprazolam (XANAX) 2 MG tablet Take 2 mg by mouth 3 (three) times daily.    Yes Historical Provider, MD  DULoxetine (CYMBALTA) 60 MG capsule Take 120 mg by mouth  every morning.   Yes Historical Provider, MD  hydrochlorothiazide (MICROZIDE) 12.5 MG capsule Take 12.5 mg by mouth daily.   Yes Historical Provider, MD  HYDROcodone-acetaminophen (NORCO/VICODIN) 5-325 MG per tablet Take 1 tablet by mouth every 6 (six) hours as needed for moderate pain.   Yes Historical Provider, MD  ketorolac (ACULAR) 0.5 % ophthalmic solution Place 1 drop into the left eye daily as needed (for pain).   Yes Historical Provider, MD  lubiprostone (AMITIZA) 24 MCG capsule Take 1 capsule (24 mcg total) by mouth 2 (two) times daily with a meal. 08/27/14  Yes Mahala Menghini, PA-C  naproxen sodium (ANAPROX) 550 MG tablet Take 550 mg by mouth 2 (two) times daily as needed for mild pain or moderate pain.   Yes Historical Provider, MD  oxcarbazepine (TRILEPTAL) 600 MG tablet Take 300-600 mg by mouth 2 (two) times daily. Takes 300mg  in the am and 600mg  incthe pm   Yes Historical Provider, MD  pantoprazole (PROTONIX) 40 MG tablet Take 1 tablet (40 mg total) by mouth daily. 08/27/14  Yes Mahala Menghini, PA-C  potassium chloride (K-DUR) 10 MEQ tablet Take 10 mEq by mouth every 12 (twelve) hours.   Yes Historical Provider, MD  rizatriptan (MAXALT) 10 MG tablet Take 10 mg by mouth as needed for migraine. May repeat in 2 hours if needed   Yes Historical Provider, MD  timolol (BETIMOL) 0.5 % ophthalmic solution Place 1 drop into the left eye daily as needed (for pressure/fluid).   Yes Historical Provider, MD  zolpidem (AMBIEN CR) 12.5 MG CR tablet Take 12.5 mg by mouth at bedtime as needed for sleep.   Yes Historical Provider, MD  promethazine (PHENERGAN) 25 MG suppository Place 1 suppository (25 mg total) rectally every 6 (six) hours as needed for nausea or vomiting. Patient not taking: Reported on 09/25/2014 08/27/14   Mahala Menghini, PA-C  promethazine (PHENERGAN) 25 MG tablet Take 1 tablet (25 mg total) by mouth every 6 (six) hours as needed for nausea or vomiting. Patient not taking: Reported on  09/25/2014 08/27/14   Mahala Menghini, PA-C   Physical Exam: Filed Vitals:   09/25/14 1900 09/25/14 1919 09/25/14 1930 09/25/14 2000  BP: 106/95  114/91 122/91  Pulse: 98  86 81  Temp:  98.4 F (36.9 C)    TempSrc:  Oral    Resp: 23  13 15   Height:      Weight:      SpO2: 94%  97% 95%    Wt Readings from Last 3 Encounters:  09/25/14 64.864 kg (143 lb)  08/27/14 64.411 kg (142 lb)  06/18/14 62.596 kg (138 lb)    General:  Appears calm and comfortable Eyes:  PERRL, normal lids, irises &  conjunctiva ENT:  grossly normal hearing, lips & tongue Neck:  no LAD, masses or thyromegaly Cardiovascular:  RRR, no m/r/g. No LE edema. Telemetry:  SR, no arrhythmias  Respiratory:  CTA bilaterally, no w/r/r. Normal respiratory effort. Abdomen:  Normoactive bowel sounds, minimally distended, minimal intermittent tenderness to palpation suprapubically and bilateral flank. Skin:  no rash or induration seen on limited exam Musculoskeletal:  grossly normal tone BUE/BLE Psychiatric:  grossly normal mood and affect, speech fluent and appropriate Neurologic:  grossly non-focal.          Labs on Admission:  Basic Metabolic Panel:  Recent Labs Lab 09/25/14 1550 09/25/14 1856  NA 118* 120*  K 3.0* 2.7*  CL 79* 83*  CO2 30  --   GLUCOSE 84 81  BUN 6 <3*  CREATININE 0.59 0.60  CALCIUM 8.6*  --    Liver Function Tests:  Recent Labs Lab 09/25/14 1550  AST 22  ALT 19  ALKPHOS 64  BILITOT 0.4  PROT 6.3*  ALBUMIN 3.3*   No results for input(s): LIPASE, AMYLASE in the last 168 hours. No results for input(s): AMMONIA in the last 168 hours. CBC:  Recent Labs Lab 09/25/14 1550 09/25/14 1856  WBC 11.1*  --   NEUTROABS 6.5  --   HGB 11.8* 11.9*  HCT 33.8* 35.0*  MCV 84.5  --   PLT 569*  --    Cardiac Enzymes: No results for input(s): CKTOTAL, CKMB, CKMBINDEX, TROPONINI in the last 168 hours.  BNP (last 3 results) No results for input(s): BNP in the last 8760 hours.  ProBNP  (last 3 results) No results for input(s): PROBNP in the last 8760 hours.  CBG: No results for input(s): GLUCAP in the last 168 hours.  Radiological Exams on Admission: No results found.  EKG: Independently reviewed. Sinus, pvc, no sign of ACS  Assessment/Plan Principal Problem:   Hyponatremia Active Problems:   Nausea with vomiting   Abdominal pain, epigastric   Diarrhea   Abdominal pain   GERD (gastroesophageal reflux disease)   HTN (hypertension)   Depression with anxiety   Insomnia  Hyponatremia: likely secondary to severe GI loss. Associated left findings of hypokalemia. - Telemetry - NS IV ( goal correction of less than 8-9 mEq NA per day) - BMET Q6 - Mag - Kcl 2mEq x8 (pt takes KDur 19mEq daily at baseline) - Mag  Nausea vomiting diarrhea: Likely secondary to viral gastroenteritis versus acute bacterial. WBC 11.1, afebrile. Minimal abdominal pain. No bloody stools. Abdominal pain is at baseline for patient. - C. difficile - Stool culture - probiotic - IVF as above - Zofran - Stool ova and parasite - Clear liquid diet. ADAT  Abdominal pain: Chronic and ongoing issue for patient. Actively managed by GI with possible diagnosis of IBS. CT on 09/20/2014 without acute process noted.  - hold home Amitiza - Hpylori  GERD: - continue PPI  Migraines: no current migraine - continue rizatriptan PRN  Hypertension: normo to hypotensive - hold HCTZ until pressure improves - Hydralazine PRN  Insomnia: - continue Ambien   Depression/Anxiety: - continue Xanax and Cymbalta  Code Status: FULL DVT Prophylaxis: hep Family Communication: None Disposition Plan: pending improvement  MERRELL, DAVID Lenna Sciara, MD Family Medicine [facility] [email or web address] Password [email or web address]

## 2014-09-25 NOTE — Telephone Encounter (Signed)
Patient is having some severe abd pain and wanted to know if she could have something for her pain.  Please see ct scan from 09/20/14.

## 2014-09-25 NOTE — ED Notes (Signed)
CRITICAL VALUE ALERT  Critical value received:  Sodium 118  Date of notification: 09/25/14  Time of notification:  1629  Critical value read back: yes Nurse who received alert:  Ilda Mori  MD notified (1st page):zammitt  Time of first page:  20  MD notified (2nd page):  Time of second page:  Responding MD:  zammitt  Time MD responded:  1630

## 2014-09-25 NOTE — Telephone Encounter (Signed)
Please call patient and find out more information. She did not mention severe pain two days ago when CT results called to her.   Where is her pain? Any other symptoms, ie n/v, bowel issues, etc. If really having severe pain, then she should go to the ER.

## 2014-09-25 NOTE — Telephone Encounter (Signed)
Noted  

## 2014-09-25 NOTE — Telephone Encounter (Signed)
Pt called back and she is having sever pain on both of her sides. She is having nausea and diarrhea also. Her pain level is a 10. I advise her to go to the ER since her pain was so severe. She stated that she would go to [facility].

## 2014-09-25 NOTE — ED Provider Notes (Signed)
CSN: [id number]     Arrival date & time 09/25/14  1446 History   First MD Initiated Contact with Patient 09/25/14 1631     Chief Complaint  Patient presents with  . Abdominal Pain     (Consider location/radiation/quality/duration/timing/severity/associated sxs/prior Treatment) Patient is a 39 y.o. female presenting with abdominal pain. The history is provided by the patient (the pt complains of some lower abd pain and weakness).  Abdominal Pain Pain location:  Suprapubic Pain quality: aching   Pain radiates to:  Does not radiate Pain severity:  Moderate Onset quality:  Gradual Timing:  Constant Progression:  Unchanged Chronicity:  Recurrent Associated symptoms: no chest pain, no cough, no diarrhea, no fatigue and no hematuria     Past Medical History  Diagnosis Date  . Anxiety   . Depression   . Seizures     last seizure was 8 mo ago.   Marland Kitchen Herniated disc   . Chronic back pain   . Cervical cancer     age 56  . Leukemia     age 22  . Other abnormality of brain or central nervous system function study     ?MS, work up in progress  . ANA positive     ???   Past Surgical History  Procedure Laterality Date  . Cholecystectomy    . Cesarean section    . Bone marrow transplant    . Back surgery  06/29/11    L4-5 microdiskectomy  . Intrauterine device insertion      removed  . Nose surgery    . Back surgery  2012  . Colonoscopy  06/2013    Dr. Arther Dames at [facility]: 20 mm sessile polyp removed from the proximal transverse colon, tubulovillous adenoma, 8 mm polyp from the distal transverse colon was tubular adenoma. No high-grade dysplasia. Recommended to have a six-month follow-up colonoscopy, she has not had this done.  . Esophagogastroduodenoscopy  January 2015    Dr. Arther Dames at [facility]. normal . bx negative for H.pylori   Family History  Problem Relation Age of Onset  . Diabetes Mother   . Cancer Maternal Grandmother     breast cancer  .  Melanoma Father   . Diabetes Maternal Grandfather   . Stroke Maternal Grandfather   . Arthritis    . Colon cancer Neg Hx   . Skin cancer Mother    History  Substance Use Topics  . Smoking status: Current Every Day Smoker -- 0.25 packs/day    Types: Cigarettes  . Smokeless tobacco: Never Used  . Alcohol Use: No   OB History    No data available     Review of Systems  Constitutional: Negative for appetite change and fatigue.  HENT: Negative for congestion, ear discharge and sinus pressure.   Eyes: Negative for discharge.  Respiratory: Negative for cough.   Cardiovascular: Negative for chest pain.  Gastrointestinal: Positive for abdominal pain. Negative for diarrhea.  Genitourinary: Negative for frequency and hematuria.  Musculoskeletal: Negative for back pain.  Skin: Negative for rash.  Neurological: Negative for seizures and headaches.  Psychiatric/Behavioral: Negative for hallucinations.      Allergies  Zetia; Hydromorphone; Morphine; Oxycodone; and Codeine  Home Medications   Prior to Admission medications   Medication Sig Start Date End Date Taking? Authorizing Provider  albuterol (PROVENTIL HFA;VENTOLIN HFA) 108 (90 BASE) MCG/ACT inhaler Inhale 1-2 puffs into the lungs every 6 (six) hours as needed for wheezing or shortness of breath. 08/23/13  Yes Tammy Triplett, PA-C  alprazolam (XANAX) 2 MG tablet Take 2 mg by mouth 3 (three) times daily.    Yes Historical Provider, MD  DULoxetine (CYMBALTA) 60 MG capsule Take 120 mg by mouth every morning.   Yes Historical Provider, MD  hydrochlorothiazide (MICROZIDE) 12.5 MG capsule Take 12.5 mg by mouth daily.   Yes Historical Provider, MD  HYDROcodone-acetaminophen (NORCO/VICODIN) 5-325 MG per tablet Take 1 tablet by mouth every 6 (six) hours as needed for moderate pain.   Yes Historical Provider, MD  ketorolac (ACULAR) 0.5 % ophthalmic solution Place 1 drop into the left eye daily as needed (for pain).   Yes Historical Provider,  MD  lubiprostone (AMITIZA) 24 MCG capsule Take 1 capsule (24 mcg total) by mouth 2 (two) times daily with a meal. 08/27/14  Yes Mahala Menghini, PA-C  naproxen sodium (ANAPROX) 550 MG tablet Take 550 mg by mouth 2 (two) times daily as needed for mild pain or moderate pain.   Yes Historical Provider, MD  oxcarbazepine (TRILEPTAL) 600 MG tablet Take 300-600 mg by mouth 2 (two) times daily. Takes 300mg  in the am and 600mg  incthe pm   Yes Historical Provider, MD  pantoprazole (PROTONIX) 40 MG tablet Take 1 tablet (40 mg total) by mouth daily. 08/27/14  Yes Mahala Menghini, PA-C  potassium chloride (K-DUR) 10 MEQ tablet Take 10 mEq by mouth every 12 (twelve) hours.   Yes Historical Provider, MD  rizatriptan (MAXALT) 10 MG tablet Take 10 mg by mouth as needed for migraine. May repeat in 2 hours if needed   Yes Historical Provider, MD  timolol (BETIMOL) 0.5 % ophthalmic solution Place 1 drop into the left eye daily as needed (for pressure/fluid).   Yes Historical Provider, MD  zolpidem (AMBIEN CR) 12.5 MG CR tablet Take 12.5 mg by mouth at bedtime as needed for sleep.   Yes Historical Provider, MD  promethazine (PHENERGAN) 25 MG suppository Place 1 suppository (25 mg total) rectally every 6 (six) hours as needed for nausea or vomiting. Patient not taking: Reported on 09/25/2014 08/27/14   Mahala Menghini, PA-C  promethazine (PHENERGAN) 25 MG tablet Take 1 tablet (25 mg total) by mouth every 6 (six) hours as needed for nausea or vomiting. Patient not taking: Reported on 09/25/2014 08/27/14   Mahala Menghini, PA-C   BP 105/85 mmHg  Pulse 85  Temp(Src) 98.5 F (36.9 C) (Oral)  Resp 18  Ht 5\' 5"  (1.651 m)  Wt 143 lb (64.864 kg)  BMI 23.80 kg/m2  SpO2 99%  LMP 12/25/2013 Physical Exam  Constitutional: She is oriented to person, place, and time. She appears well-developed.  HENT:  Head: Normocephalic.  Eyes: Conjunctivae and EOM are normal. No scleral icterus.  Neck: Neck supple. No thyromegaly present.   Cardiovascular: Normal rate and regular rhythm.  Exam reveals no gallop and no friction rub.   No murmur heard. Pulmonary/Chest: No stridor. She has no wheezes. She has no rales. She exhibits no tenderness.  Abdominal: She exhibits no distension. There is tenderness. There is no rebound.  Tender llq and rlq mild  Musculoskeletal: Normal range of motion. She exhibits no edema.  Lymphadenopathy:    She has no cervical adenopathy.  Neurological: She is oriented to person, place, and time. She exhibits normal muscle tone. Coordination normal.  Skin: No rash noted. No erythema.  Psychiatric: She has a normal mood and affect. Her behavior is normal.    ED Course  Procedures (including critical care time)  Labs Review Labs Reviewed  CBC WITH DIFFERENTIAL/PLATELET - Abnormal; Notable for the following:    WBC 11.1 (*)    Hemoglobin 11.8 (*)    HCT 33.8 (*)    Platelets 569 (*)    All other components within normal limits  COMPREHENSIVE METABOLIC PANEL - Abnormal; Notable for the following:    Sodium 118 (*)    Potassium 3.0 (*)    Chloride 79 (*)    Calcium 8.6 (*)    Total Protein 6.3 (*)    Albumin 3.3 (*)    All other components within normal limits  URINALYSIS, ROUTINE W REFLEX MICROSCOPIC (NOT AT [facility]) - Abnormal; Notable for the following:    APPearance HAZY (*)    All other components within normal limits  I-STAT CHEM 8, ED - Abnormal; Notable for the following:    Sodium 120 (*)    Potassium 2.7 (*)    Chloride 83 (*)    BUN <3 (*)    Calcium, Ion 1.05 (*)    Hemoglobin 11.9 (*)    HCT 35.0 (*)    All other components within normal limits  PREGNANCY, URINE    Imaging Review No results found.   EKG Interpretation   Date/Time:  Wednesday September 25 2014 16:55:50 EDT Ventricular Rate:  81 PR Interval:  206 QRS Duration: 86 QT Interval:  373 QTC Calculation: 433 R Axis:   80 Text Interpretation:  Sinus rhythm Ventricular premature complex  Borderline prolonged PR  interval Baseline wander in lead(s) III V4  Confirmed by Garnet Chatmon  MD, Disaya Walt ([id number]) on 09/25/2014 6:26:12 PM      MDM   Final diagnoses:  Hyponatremia    Ct abd 6/3 neg,  Pt with chronic abd pain.   Hyponatremia will admit    Milton Ferguson, MD 09/25/14 1902

## 2014-09-25 NOTE — ED Notes (Addendum)
Pt reports was seen here recently and diagnosed with "kidney lesions". Pt reports abdominal pain and not able to urinate x3 days. Pt reports n/d as well.pt reports has followed up with GI and has had a CT scan 09/20/14.

## 2014-09-26 ENCOUNTER — Inpatient Hospital Stay (HOSPITAL_COMMUNITY): Payer: Medicaid Other

## 2014-09-26 ENCOUNTER — Encounter (HOSPITAL_COMMUNITY): Payer: Self-pay | Admitting: *Deleted

## 2014-09-26 DIAGNOSIS — I10 Essential (primary) hypertension: Secondary | ICD-10-CM

## 2014-09-26 DIAGNOSIS — E871 Hypo-osmolality and hyponatremia: Principal | ICD-10-CM

## 2014-09-26 DIAGNOSIS — R112 Nausea with vomiting, unspecified: Secondary | ICD-10-CM

## 2014-09-26 DIAGNOSIS — R109 Unspecified abdominal pain: Secondary | ICD-10-CM

## 2014-09-26 LAB — BASIC METABOLIC PANEL
ANION GAP: 8 (ref 5–15)
ANION GAP: 8 (ref 5–15)
Anion gap: 5 (ref 5–15)
BUN: <5 mg/dL — ABNORMAL LOW (ref 6–20)
BUN: <5 mg/dL — ABNORMAL LOW (ref 6–20)
BUN: <5 mg/dL — ABNORMAL LOW (ref 6–20)
CALCIUM: 7.8 mg/dL — AB (ref 8.9–10.3)
CALCIUM: 8.2 mg/dL — AB (ref 8.9–10.3)
CO2: 27 mmol/L (ref 22–32)
CO2: 29 mmol/L (ref 22–32)
CO2: 25 mmol/L (ref 22–32)
CREATININE: 0.52 mg/dL (ref 0.44–1.00)
Calcium: 8.6 mg/dL — ABNORMAL LOW (ref 8.9–10.3)
Chloride: 89 mmol/L — ABNORMAL LOW (ref 101–111)
Chloride: 96 mmol/L — ABNORMAL LOW (ref 101–111)
Chloride: 102 mmol/L (ref 101–111)
Creatinine, Ser: 0.52 mg/dL (ref 0.44–1.00)
Creatinine, Ser: 0.55 mg/dL (ref 0.44–1.00)
GFR calc Af Amer: >60 mL/min (ref >60)
GFR calc Af Amer: >60 mL/min (ref >60)
GFR calc non Af Amer: >60 mL/min (ref >60)
GLUCOSE: 90 mg/dL (ref 65–99)
Glucose, Bld: 86 mg/dL (ref 65–99)
Glucose, Bld: 86 mg/dL (ref 65–99)
POTASSIUM: 3.3 mmol/L — AB (ref 3.5–5.1)
POTASSIUM: 4.4 mmol/L (ref 3.5–5.1)
Potassium: 4.2 mmol/L (ref 3.5–5.1)
SODIUM: 124 mmol/L — AB (ref 135–145)
Sodium: 130 mmol/L — ABNORMAL LOW (ref 135–145)
Sodium: 135 mmol/L (ref 135–145)

## 2014-09-26 LAB — CLOSTRIDIUM DIFFICILE BY PCR: Toxigenic C. Difficile by PCR: NEGATIVE

## 2014-09-26 MED ORDER — POTASSIUM CHLORIDE CRYS ER 10 MEQ PO TBCR
10.0000 meq | EXTENDED_RELEASE_TABLET | Freq: Two times a day (BID) | ORAL | Status: DC
  Administered 2014-09-26 – 2014-09-28 (×5): 10 meq via ORAL
  Filled 2014-09-26 (×14): qty 1

## 2014-09-26 MED ORDER — OXCARBAZEPINE 300 MG PO TABS
300.0000 mg | ORAL_TABLET | Freq: Every day | ORAL | Status: DC
  Administered 2014-09-26 – 2014-09-28 (×3): 300 mg via ORAL
  Filled 2014-09-26 (×5): qty 1

## 2014-09-26 MED ORDER — POTASSIUM CHLORIDE CRYS ER 20 MEQ PO TBCR
40.0000 meq | EXTENDED_RELEASE_TABLET | Freq: Once | ORAL | Status: AC
  Administered 2014-09-26: 40 meq via ORAL
  Filled 2014-09-26: qty 2

## 2014-09-26 MED ORDER — OXCARBAZEPINE 300 MG PO TABS
600.0000 mg | ORAL_TABLET | Freq: Every day | ORAL | Status: DC
  Administered 2014-09-26 – 2014-09-27 (×2): 600 mg via ORAL
  Filled 2014-09-26 (×4): qty 2

## 2014-09-26 MED ORDER — HYDROCODONE-ACETAMINOPHEN 5-325 MG PO TABS
1.0000 | ORAL_TABLET | Freq: Four times a day (QID) | ORAL | Status: DC | PRN
  Administered 2014-09-26 – 2014-09-28 (×7): 1 via ORAL
  Filled 2014-09-26 (×8): qty 1

## 2014-09-26 MED ORDER — OXCARBAZEPINE 300 MG PO TABS: 300.0000 mg | ORAL_TABLET | Freq: Two times a day (BID) | ORAL | Status: DC

## 2014-09-26 MED ORDER — SUMATRIPTAN SUCCINATE 50 MG PO TABS
ORAL_TABLET | ORAL | Status: AC
  Filled 2014-09-26: qty 2

## 2014-09-26 MED ORDER — ALBUTEROL SULFATE (2.5 MG/3ML) 0.083% IN NEBU: 2.5000 mg | INHALATION_SOLUTION | Freq: Four times a day (QID) | RESPIRATORY_TRACT | Status: DC | PRN

## 2014-09-26 MED ORDER — NICOTINE 21 MG/24HR TD PT24
21.0000 mg | MEDICATED_PATCH | Freq: Every day | TRANSDERMAL | Status: DC
  Administered 2014-09-26 – 2014-09-27 (×2): 21 mg via TRANSDERMAL
  Filled 2014-09-26 (×4): qty 1

## 2014-09-26 NOTE — Progress Notes (Signed)
CRITICAL VALUE ALERT  Critical value received:  [id number]   Date of notification:  09/25/2014  Time of notification:  2110  Critical value read back:Yes.    Nurse who received alert:  Alessandra Grout   MD notified (1st page):  K.Schorr, NP  Time of first page:  2139  MD notified (2nd page):  Time of second page:2230  Responding MD:  K.Schorr  Time MD responded:  2250

## 2014-09-26 NOTE — Progress Notes (Signed)
UR chart review completed.  

## 2014-09-26 NOTE — Progress Notes (Signed)
PROGRESS NOTE  Lauren Cross GYK:[id number] DOB: February 08, 1976 DOA: 09/25/2014 PCP: Antionette Fairy, PA-C  Assessment/Plan: Hyponatremia: likely secondary to severe GI loss. Associated left findings of hypokalemia. Improving with hydration -replace K -hold HCTZ  Nausea vomiting diarrhea: Likely secondary to viral gastroenteritis versus acute bacterial. WBC 11.1, afebrile. Minimal abdominal pain. No bloody stools. Abdominal pain is at baseline for patient. -no further diarrhea- check x ray as I suspect constipated -limit pain meds - probiotic - IVF as above - Zofran - Stool ova and parasite - Clear liquid diet. ADAT  Urinary retention -foley placed  Abdominal pain: Chronic and ongoing issue for patient. Actively managed by GI with possible diagnosis of IBS. CT on 09/20/2014 without acute process noted.  -  Amitiza - Hpylori -does not appear in pain- resting in bed  GERD: - continue PPI  Migraines: no current migraine - continue rizatriptan PRN  Hypertension: normo to hypotensive - hold HCTZ until pressure improves - Hydralazine PRN  Insomnia: - continue Ambien  Depression/Anxiety: - continue Xanax and Cymbalta  Code Status: full Family Communication: patient Disposition Plan:    Consultants:    Procedures:    HPI/Subjective: C/o severe abd pain upon awakening  Objective: Filed Vitals:   09/26/14 0421  BP: 100/77  Pulse: 75  Temp:   Resp:     Intake/Output Summary (Last 24 hours) at 09/26/14 0934 Last data filed at 09/26/14 0859  Gross per 24 hour  Intake 1641.67 ml  Output   4400 ml  Net -2758.33 ml   Filed Weights   09/25/14 1450 09/25/14 2224  Weight: 64.864 kg (143 lb) 68.947 kg (152 lb)    Exam:   General:  Awake, NAD, in bed  Cardiovascular: rrr  Respiratory: clear  Abdomen: +BS, soft  Musculoskeletal: no edema   Data Reviewed: Basic Metabolic Panel:  Recent Labs Lab 09/25/14 1550 09/25/14 1856 09/25/14 2045  09/26/14 0226  NA 118* 120* 118* 124*  K 3.0* 2.7* 3.0* 3.3*  CL 79* 83* 87* 89*  CO2 30  --  27 27  GLUCOSE 84 81 82 86  BUN 6 <3* 5* <5*  CREATININE 0.59 0.60 0.49 0.52  CALCIUM 8.6*  --  7.5* 7.8*  MG  --   --  1.7  --    Liver Function Tests:  Recent Labs Lab 09/25/14 1550  AST 22  ALT 19  ALKPHOS 64  BILITOT 0.4  PROT 6.3*  ALBUMIN 3.3*   No results for input(s): LIPASE, AMYLASE in the last 168 hours. No results for input(s): AMMONIA in the last 168 hours. CBC:  Recent Labs Lab 09/25/14 1550 09/25/14 1856 09/25/14 2045  WBC 11.1*  --  10.0  NEUTROABS 6.5  --   --   HGB 11.8* 11.9* 11.8*  HCT 33.8* 35.0* 34.3*  MCV 84.5  --  85.1  PLT 569*  --  517*   Cardiac Enzymes: No results for input(s): CKTOTAL, CKMB, CKMBINDEX, TROPONINI in the last 168 hours. BNP (last 3 results) No results for input(s): BNP in the last 8760 hours.  ProBNP (last 3 results) No results for input(s): PROBNP in the last 8760 hours.  CBG: No results for input(s): GLUCAP in the last 168 hours.  No results found for this or any previous visit (from the past 240 hour(s)).   Studies: No results found.  Scheduled Meds: . acidophilus  2 capsule Oral Daily  . alprazolam  2 mg Oral TID  . DULoxetine  120 mg Oral  q morning - 10a  . heparin  5,000 Units Subcutaneous 3 times per day  . lubiprostone  24 mcg Oral BID WC  . nicotine  21 mg Transdermal Daily  . pantoprazole  40 mg Oral Daily  . sodium chloride  3 mL Intravenous Q12H   Continuous Infusions: . sodium chloride 125 mL/hr at 09/25/14 2203   Antibiotics Given (last 72 hours)    None      Principal Problem:   Hyponatremia Active Problems:   Nausea with vomiting   Abdominal pain, epigastric   Diarrhea   Abdominal pain   GERD (gastroesophageal reflux disease)   HTN (hypertension)   Depression with anxiety   Insomnia    Time spent: 25 min    Clydie Dillen, [facility] Pager [phone]. If 7PM-7AM,  please contact night-coverage at [email or web address], password [facility] 09/26/2014, 9:34 AM  LOS: 1 day

## 2014-09-27 LAB — BASIC METABOLIC PANEL
Anion gap: 6 (ref 5–15)
BUN: <5 mg/dL — ABNORMAL LOW (ref 6–20)
CHLORIDE: 106 mmol/L (ref 101–111)
CO2: 27 mmol/L (ref 22–32)
CREATININE: 0.54 mg/dL (ref 0.44–1.00)
Calcium: 8.4 mg/dL — ABNORMAL LOW (ref 8.9–10.3)
GFR calc non Af Amer: >60 mL/min (ref >60)
GLUCOSE: 89 mg/dL (ref 65–99)
Potassium: 4.5 mmol/L (ref 3.5–5.1)
Sodium: 139 mmol/L (ref 135–145)

## 2014-09-27 LAB — CBC
HCT: 34.2 % — ABNORMAL LOW (ref 36.0–46.0)
HEMOGLOBIN: 11.1 g/dL — AB (ref 12.0–15.0)
MCH: 29.2 pg (ref 26.0–34.0)
MCHC: 32.5 g/dL (ref 30.0–36.0)
MCV: 90 fL (ref 78.0–100.0)
Platelets: 564 10*3/uL — ABNORMAL HIGH (ref 150–400)
RBC: 3.8 MIL/uL — ABNORMAL LOW (ref 3.87–5.11)
RDW: 16.3 % — AB (ref 11.5–15.5)
WBC: 8.1 10*3/uL (ref 4.0–10.5)

## 2014-09-27 MED ORDER — FLUCONAZOLE 100 MG PO TABS
100.0000 mg | ORAL_TABLET | Freq: Every day | ORAL | Status: DC
  Administered 2014-09-27 – 2014-09-28 (×2): 100 mg via ORAL
  Filled 2014-09-27 (×2): qty 1

## 2014-09-27 NOTE — Progress Notes (Signed)
PROGRESS NOTE  Lauren Cross ZGY:[id number] DOB: [birth year] DOA: 09/25/2014 PCP: Lauren Fairy, PA-C  Assessment/Plan: Hyponatremia: likely secondary to severe GI loss. Associated left findings of hypokalemia. Improving with hydration -replace K -d/c HCTZ  Nausea vomiting diarrhea: Likely secondary to viral gastroenteritis versus acute bacterial. WBC 11.1, afebrile. Minimal abdominal pain. No bloody stools. Abdominal pain is at baseline for patient. -limit pain meds - probiotic - IVF as above - Zofran - Stool ova and parasite - Clear liquid diet. ADAT  Urinary retention -foley placed- will try to d/c today  Yeast infection dilfucan  Abdominal pain: Chronic and ongoing issue for patient. Actively managed by GI with possible diagnosis of IBS. CT on 09/20/2014 without acute process noted.  -  Amitiza - Hpylori -does not appear in pain- resting in bed  GERD: - continue PPI  Migraines: no current migraine - continue rizatriptan PRN  Hypertension: normo to hypotensive - d/c HCTZ due to hyponatremia - Hydralazine PRN  Insomnia: - continue Ambien  Depression/Anxiety: - continue Xanax and Cymbalta  Code Status: full Family Communication: patient Disposition Plan: home in AM for outpatient GI follow up   Consultants:    Procedures:    HPI/Subjective: Still with loose stools but improved from yesterday  Objective: Filed Vitals:   09/27/14 0539  BP: 111/72  Pulse: 91  Temp: 98.4 F (36.9 C)  Resp: 18    Intake/Output Summary (Last 24 hours) at 09/27/14 0849 Last data filed at 09/27/14 0610  Gross per 24 hour  Intake 3742.91 ml  Output   5800 ml  Net -2057.09 ml   Filed Weights   09/25/14 1450 09/25/14 2224  Weight: 64.864 kg (143 lb) 68.947 kg (152 lb)    Exam:   General:  Awake, NAD, in bed  Cardiovascular: rrr  Respiratory: clear  Abdomen: +BS, soft  Musculoskeletal: no edema   Data Reviewed: Basic Metabolic Panel:  Recent  Labs Lab 09/25/14 2045 09/26/14 0226 09/26/14 0902 09/26/14 1414 09/27/14 0711  NA 118* 124* 130* 135 139  K 3.0* 3.3* 4.2 4.4 4.5  CL 87* 89* 96* 102 106  CO2 27 27 29 25 27   GLUCOSE 82 86 90 86 89  BUN 5* <5* <5* <5* <5*  CREATININE 0.49 0.52 0.52 0.55 0.54  CALCIUM 7.5* 7.8* 8.2* 8.6* 8.4*  MG 1.7  --   --   --   --    Liver Function Tests:  Recent Labs Lab 09/25/14 1550  AST 22  ALT 19  ALKPHOS 64  BILITOT 0.4  PROT 6.3*  ALBUMIN 3.3*   No results for input(s): LIPASE, AMYLASE in the last 168 hours. No results for input(s): AMMONIA in the last 168 hours. CBC:  Recent Labs Lab 09/25/14 1550 09/25/14 1856 09/25/14 2045 09/27/14 0711  WBC 11.1*  --  10.0 8.1  NEUTROABS 6.5  --   --   --   HGB 11.8* 11.9* 11.8* 11.1*  HCT 33.8* 35.0* 34.3* 34.2*  MCV 84.5  --  85.1 90.0  PLT 569*  --  517* 564*   Cardiac Enzymes: No results for input(s): CKTOTAL, CKMB, CKMBINDEX, TROPONINI in the last 168 hours. BNP (last 3 results) No results for input(s): BNP in the last 8760 hours.  ProBNP (last 3 results) No results for input(s): PROBNP in the last 8760 hours.  CBG: No results for input(s): GLUCAP in the last 168 hours.  Recent Results (from the past 240 hour(s))  Clostridium Difficile by PCR (not at [facility])  Status: None   Collection Time: 09/26/14  1:57 PM  Result Value Ref Range Status   C difficile by pcr NEGATIVE NEGATIVE Final     Studies: Dg Abd 2 Views  09/26/2014   CLINICAL DATA:  Chronic abdominal pain.  History of cervical cancer.  EXAM: ABDOMEN - 2 VIEW  COMPARISON:  Abdominal pelvic CT 09/20/2014.  FINDINGS: The bowel gas pattern is nonobstructive. There are a few scattered air-fluid levels within nondistended bowel on the erect examination. There is no bowel wall thickening or free intraperitoneal air. Calcified granulomas within the liver and spleen and cholecystectomy clips are noted. There is a small amount residual contrast material within the  distal colon. No worrisome osseous findings.  IMPRESSION: No acute abdominal findings.  No evidence of bowel obstruction.   Electronically Signed   By: Richardean Sale M.D.   On: 09/26/2014 14:32    Scheduled Meds: . acidophilus  2 capsule Oral Daily  . alprazolam  2 mg Oral TID  . DULoxetine  120 mg Oral q morning - 10a  . fluconazole  100 mg Oral Daily  . heparin  5,000 Units Subcutaneous 3 times per day  . lubiprostone  24 mcg Oral BID WC  . nicotine  21 mg Transdermal Daily  . OXcarbazepine  300 mg Oral Daily   And  . OXcarbazepine  600 mg Oral QHS  . pantoprazole  40 mg Oral Daily  . potassium chloride  10 mEq Oral Q12H  . sodium chloride  3 mL Intravenous Q12H   Continuous Infusions:   Antibiotics Given (last 72 hours)    None      Principal Problem:   Hyponatremia Active Problems:   Nausea with vomiting   Abdominal pain, epigastric   Diarrhea   Abdominal pain   GERD (gastroesophageal reflux disease)   HTN (hypertension)   Depression with anxiety   Insomnia    Time spent: 25 min    [name], [place] [facility] Pager [phone]. If 7PM-7AM, please contact night-coverage at [email or web address], password [facility] 09/27/2014, 8:49 AM  LOS: 2 days

## 2014-09-27 NOTE — Progress Notes (Signed)
Ambien given per patient request for sleep.

## 2014-09-27 NOTE — Care Management Note (Signed)
Case Management Note  Patient Details  Name: Lauren Cross MRN: [id number] Date of Birth: [birth year]  Subjective/Objective:                  Pt admitted from home with hyponatremia. Pt lives with her mother and will return home at discharge. Pt is independent with ADl's.  Action/Plan: No Cm needs noted. Anticipate discharge over the weekend.  Expected Discharge Date:                  Expected Discharge Plan:  Home/Self Care  In-House Referral:  NA  Discharge planning Services  CM Consult  Post Acute Care Choice:  NA Choice offered to:  NA  DME Arranged:    DME Agency:     HH Arranged:    HH Agency:     Status of Service:  Completed, signed off  Medicare Important Message Given:    Date Medicare IM Given:    Medicare IM give by:    Date Additional Medicare IM Given:    Additional Medicare Important Message give by:     If discussed at [place] of Stay Meetings, dates discussed:    Additional Comments:  Joylene Draft, RN 09/27/2014, 1:15 PM

## 2014-09-27 NOTE — Progress Notes (Signed)
Patient reports stools loose today, not watery.

## 2014-09-28 DIAGNOSIS — R197 Diarrhea, unspecified: Secondary | ICD-10-CM

## 2014-09-28 LAB — CBC
HCT: 34.6 % — ABNORMAL LOW (ref 36.0–46.0)
Hemoglobin: 11.2 g/dL — ABNORMAL LOW (ref 12.0–15.0)
MCH: 28.9 pg (ref 26.0–34.0)
MCHC: 32.4 g/dL (ref 30.0–36.0)
MCV: 89.4 fL (ref 78.0–100.0)
Platelets: 548 10*3/uL — ABNORMAL HIGH (ref 150–400)
RBC: 3.87 MIL/uL (ref 3.87–5.11)
RDW: 16.5 % — AB (ref 11.5–15.5)
WBC: 11.3 10*3/uL — AB (ref 4.0–10.5)

## 2014-09-28 LAB — H.PYLORI ANTIGEN, STOOL

## 2014-09-28 LAB — BASIC METABOLIC PANEL
ANION GAP: 8 (ref 5–15)
BUN: 5 mg/dL — ABNORMAL LOW (ref 6–20)
CHLORIDE: 101 mmol/L (ref 101–111)
CO2: 25 mmol/L (ref 22–32)
CREATININE: 0.59 mg/dL (ref 0.44–1.00)
Calcium: 8.6 mg/dL — ABNORMAL LOW (ref 8.9–10.3)
GFR calc Af Amer: >60 mL/min (ref >60)
GFR calc non Af Amer: >60 mL/min (ref >60)
Glucose, Bld: 98 mg/dL (ref 65–99)
POTASSIUM: 4.1 mmol/L (ref 3.5–5.1)
Sodium: 134 mmol/L — ABNORMAL LOW (ref 135–145)

## 2014-09-28 NOTE — Progress Notes (Signed)
PROGRESS NOTE  Lauren Cross WIO:[id number] DOB: March 06, 1976 DOA: 09/25/2014 PCP: [facility]   Summary: 39 year old woman presented to the emergency department with suprapubic abdominal pain, aching in nature with associated diarrhea, nonbloody and decreased urine output. Also episodes of nausea and vomiting. Followed closely by GI. She was admitted for severe hyponatremia, nausea, vomiting, diarrhea.  Assessment/Plan: 1. Hyponatremia. Essentially resolved. Asymptomatic. Decondary to severe GI loss. Has stabilized while continuing Cymbalta, Trileptal. Stop HCTZ. 2. Nausea, vomiting, diarrhea. Resolved. Likely secondary to viral gastroenteritis. C. Difficile negative. Stool culture pending. 3. Chronic abdominal pain. Stable. Possible IBS. CT abdomen and pelvis 09/20/2014 without acute process. 4. Urinary retention. Resolved. Foley catheter placed 6/9. 5. Yeast infection. Resolved.  6. Anxiety, depression. Stable. 7. Seizure disorder. Stable. 8. Hypertension. Well-controlled without hydrochlorothiazide.    Doing well.  Home today on usual medications except HCTZ.   Murray Hodgkins, MD  [facility]  Pager [phone] If 7PM-7AM, please contact night-coverage at [email or web address], password [facility] 09/28/2014, 4:58 PM  LOS: 3 days   Consultants:  None   Procedures:  None   Antibiotics:    HPI/Subjective: Foley catheter removed.  Feels good, eating fine, no n/v/d.  Objective: Filed Vitals:   09/27/14 1503 09/27/14 2152 09/28/14 0531 09/28/14 1432  BP: 111/73 111/79 122/78 112/81  Pulse: 93 101 102 93  Temp: 98.8 F (37.1 C) 98.8 F (37.1 C) 98.6 F (37 C) 98.6 F (37 C)  TempSrc: Oral Oral Oral Oral  Resp: 18 20 17 20   Height:      Weight:      SpO2: 97% 100% 96% 95%    Intake/Output Summary (Last 24 hours) at 09/28/14 1658 Last data filed at 09/28/14 1300  Gross per 24 hour  Intake    483 ml  Output      0 ml  Net    483 ml      Filed Weights   09/25/14 1450 09/25/14 2224  Weight: 64.864 kg (143 lb) 68.947 kg (152 lb)    Exam:     Afebrile, vital signs stable, no hypoxia General: Appears calm and comfortable Cardiovascular: RRR, no m/r/g.  Respiratory: CTA bilaterally, no w/r/r. Normal respiratory effort. Abdomen: soft, ntnd Psychiatric: grossly normal mood and affect, speech fluent and appropriate  Data reviewed:  Sodium 118 >> >> 134  Chloride 79 >> >> 101  Potassium normal, 4.1. Basic metabolic panel otherwise unremarkable.  CBC stable with modest thrombocytosis which is chronic. Hemoglobin stable 11.2.  Urinalysis was negative  Abdominal x-ray on admission no acute findings.  Pending data:  Stool culture  Scheduled Meds: . acidophilus  2 capsule Oral Daily  . alprazolam  2 mg Oral TID  . DULoxetine  120 mg Oral q morning - 10a  . fluconazole  100 mg Oral Daily  . heparin  5,000 Units Subcutaneous 3 times per day  . lubiprostone  24 mcg Oral BID WC  . nicotine  21 mg Transdermal Daily  . OXcarbazepine  300 mg Oral Daily   And  . OXcarbazepine  600 mg Oral QHS  . pantoprazole  40 mg Oral Daily  . potassium chloride  10 mEq Oral Q12H  . sodium chloride  3 mL Intravenous Q12H   Continuous Infusions:   Principal Problem:   Hyponatremia Active Problems:   Nausea with vomiting   Abdominal pain, epigastric   Diarrhea   Abdominal pain   GERD (gastroesophageal reflux disease)   HTN (hypertension)   Depression  with anxiety   Insomnia

## 2014-09-28 NOTE — Discharge Summary (Signed)
Physician Discharge Summary  Lauren Cross FTD:[id number] DOB: 1976-04-08 DOA: 09/25/2014  PCP: Antionette Fairy, PA-C  Admit date: 09/25/2014 Discharge date: 09/28/2014  Recommendations for Outpatient Follow-up:  1. Hyponatremia, see discussion below. 2. Chronic abdominal pain. Been worked up by GI as an outpatient. 3. Hypertension. Blood pressures well controlled. Hydrochlorothiazide stopped secondary to hyponatremia.   Follow-up Information    Follow up with BAUCOM, JENNY B, PA-C.   Specialty:  Physician Assistant   Why:  As needed   Contact information:   [address] [place] [address] [phone]       Follow up with Neil Crouch, PA-C.   Specialty:  Gastroenterology   Why:  keep appointment June 13th.   Contact information:   [address] [place] [address] [phone]      Discharge Diagnoses:  1. Hyponatremia 2. Nausea, vomiting, diarrhea 3. Suspected viral gastroenteritis 4. Chronic abdominal pain 5. Urinary retention 6. Anxiety, depression 7. Seizure disorder 8. Hypertension  Discharge Condition: improved Disposition: home  Diet recommendation: regular  Filed Weights   09/25/14 1450 09/25/14 2224  Weight: 64.864 kg (143 lb) 68.947 kg (152 lb)    History of present illness:  39 year old woman presented to the emergency department with suprapubic abdominal pain, aching in nature with associated diarrhea, nonbloody and decreased urine output. Also episodes of nausea and vomiting. Followed closely by GI. She was admitted for severe hyponatremia, nausea, vomiting, diarrhea.  Hospital Course:  [name] was hydrated and her hydrochlorothiazide was held. Her hyponatremia gradually corrected while continuing medications as below. Nausea, vomiting and diarrhea resolved, likely a viral gastroenteritis. Chronic pain remains stable and her hospitalization was uncomplicated. Individual issues as  below.  1. Hyponatremia. Essentially resolved. Asymptomatic. Decondary to severe GI loss. Has stabilized while continuing Cymbalta, Trileptal. Stop HCTZ. 2. Nausea, vomiting, diarrhea. Resolved. Likely secondary to viral gastroenteritis. C. Difficile negative. Stool culture pending. 3. Chronic abdominal pain. Stable. Possible IBS. CT abdomen and pelvis 09/20/2014 without acute process. 4. Urinary retention. Resolved. Foley catheter placed 6/9. 5. Anxiety, depression. Stable. 6. Seizure disorder. Stable. 7. Hypertension. Well-controlled without hydrochlorothiazide.   Home today on usual medications except HCTZ.  Consultants:  None  Procedures:  None  Discharge Instructions  Discharge Instructions    Activity as tolerated - No restrictions    Complete by:  As directed      Diet general    Complete by:  As directed      Discharge instructions    Complete by:  As directed   Call your physician or seek immediate medical attention for increased pain, fever, vomiting, diarrhea or worsening of condition.          Current Discharge Medication List    CONTINUE these medications which have NOT CHANGED   Details  albuterol (PROVENTIL HFA;VENTOLIN HFA) 108 (90 BASE) MCG/ACT inhaler Inhale 1-2 puffs into the lungs every 6 (six) hours as needed for wheezing or shortness of breath. Qty: 1 Inhaler, Refills: 0    alprazolam (XANAX) 2 MG tablet Take 2 mg by mouth 3 (three) times daily.     DULoxetine (CYMBALTA) 60 MG capsule Take 120 mg by mouth every morning.    HYDROcodone-acetaminophen (NORCO/VICODIN) 5-325 MG per tablet Take 1 tablet by mouth every 6 (six) hours as needed for moderate pain.    ketorolac (ACULAR) 0.5 % ophthalmic solution Place 1 drop into the left eye daily as needed (for pain).  lubiprostone (AMITIZA) 24 MCG capsule Take 1 capsule (24 mcg total) by mouth 2 (two) times daily with a meal. Qty: 60 capsule, Refills: 3    naproxen sodium (ANAPROX) 550 MG tablet  Take 550 mg by mouth 2 (two) times daily as needed for mild pain or moderate pain.    oxcarbazepine (TRILEPTAL) 600 MG tablet Take 300-600 mg by mouth 2 (two) times daily. Takes 300mg  in the am and 600mg  incthe pm    pantoprazole (PROTONIX) 40 MG tablet Take 1 tablet (40 mg total) by mouth daily. Qty: 30 tablet, Refills: 3    potassium chloride (K-DUR) 10 MEQ tablet Take 10 mEq by mouth every 12 (twelve) hours.    rizatriptan (MAXALT) 10 MG tablet Take 10 mg by mouth as needed for migraine. May repeat in 2 hours if needed    timolol (BETIMOL) 0.5 % ophthalmic solution Place 1 drop into the left eye daily as needed (for pressure/fluid).    zolpidem (AMBIEN CR) 12.5 MG CR tablet Take 12.5 mg by mouth at bedtime as needed for sleep.      STOP taking these medications     hydrochlorothiazide (MICROZIDE) 12.5 MG capsule      promethazine (PHENERGAN) 25 MG suppository      promethazine (PHENERGAN) 25 MG tablet        Allergies  Allergen Reactions  . Zetia [Ezetimibe] Anaphylaxis  . Hydromorphone Other (See Comments)    aggitation  . Morphine Nausea And Vomiting  . Oxycodone Other (See Comments)    hallucinations  . Codeine Nausea Only    aggitation    The results of significant diagnostics from this hospitalization (including imaging, microbiology, ancillary and laboratory) are listed below for reference.    Significant Diagnostic Studies: Dg Abd 2 Views  09/26/2014   CLINICAL DATA:  Chronic abdominal pain.  History of cervical cancer.  EXAM: ABDOMEN - 2 VIEW  COMPARISON:  Abdominal pelvic CT 09/20/2014.  FINDINGS: The bowel gas pattern is nonobstructive. There are a few scattered air-fluid levels within nondistended bowel on the erect examination. There is no bowel wall thickening or free intraperitoneal air. Calcified granulomas within the liver and spleen and cholecystectomy clips are noted. There is a small amount residual contrast material within the distal colon. No worrisome  osseous findings.  IMPRESSION: No acute abdominal findings.  No evidence of bowel obstruction.   Electronically Signed   By: Richardean Sale M.D.   On: 09/26/2014 14:32    Microbiology: Recent Results (from the past 240 hour(s))  Clostridium Difficile by PCR (not at [facility])     Status: None   Collection Time: 09/26/14  1:57 PM  Result Value Ref Range Status   C difficile by pcr NEGATIVE NEGATIVE Final  Stool culture     Status: None (Preliminary result)   Collection Time: 09/26/14  1:57 PM  Result Value Ref Range Status   Specimen Description STOOL  Final   Special Requests NONE  Final   Culture   Final    Culture reincubated for better growth Performed at [facility]    Report Status PENDING  Incomplete  H.pylori Antigen, Stool     Status: None   Collection Time: 09/26/14  1:57 PM  Result Value Ref Range Status   Specimen Description STOOL  Final   Special Requests NONE  Final   H. pylori ag, stool   Final    NEGATIVE Antimicrobials,proton pump inhibitors and bismuth preparations are known to suppress H.pylori and ingestion  of these prior to testing may cause a false negative result. If a negative result is obtained for a patient that has  ingested these  compounds within two weeks prior of performing the H.pylori test, results may be falsely negative and should be repeated with a new specimen two weeks after discontinuing treatment. Performed at [facility]    Report Status 09/28/2014 FINAL  Final     Labs: Basic Metabolic Panel:  Recent Labs Lab 09/25/14 2045 09/26/14 0226 09/26/14 0902 09/26/14 1414 09/27/14 0711 09/28/14 0629  NA 118* 124* 130* 135 139 134*  K 3.0* 3.3* 4.2 4.4 4.5 4.1  CL 87* 89* 96* 102 106 101  CO2 27 27 29 25 27 25   GLUCOSE 82 86 90 86 89 98  BUN 5* <5* <5* <5* <5* 5*  CREATININE 0.49 0.52 0.52 0.55 0.54 0.59  CALCIUM 7.5* 7.8* 8.2* 8.6* 8.4* 8.6*  MG 1.7  --   --   --   --   --    Liver Function Tests:  Recent Labs Lab  09/25/14 1550  AST 22  ALT 19  ALKPHOS 64  BILITOT 0.4  PROT 6.3*  ALBUMIN 3.3*   CBC:  Recent Labs Lab 09/25/14 1550 09/25/14 1856 09/25/14 2045 09/27/14 0711 09/28/14 0629  WBC 11.1*  --  10.0 8.1 11.3*  NEUTROABS 6.5  --   --   --   --   HGB 11.8* 11.9* 11.8* 11.1* 11.2*  HCT 33.8* 35.0* 34.3* 34.2* 34.6*  MCV 84.5  --  85.1 90.0 89.4  PLT 569*  --  517* 564* 548*    Principal Problem:   Hyponatremia Active Problems:   Nausea with vomiting   Abdominal pain, epigastric   Diarrhea   Abdominal pain   GERD (gastroesophageal reflux disease)   HTN (hypertension)   Depression with anxiety   Insomnia   Time coordinating discharge: 35 minutes  Signed:  Murray Hodgkins, MD [facility] 09/28/2014, 5:26 PM

## 2014-09-28 NOTE — Progress Notes (Signed)
Patient requested Imitrex for migraine headache.  Given, lights out for comfort.  Container in room for urine, patient aware to give specimen next void.

## 2014-09-28 NOTE — Progress Notes (Signed)
Patient being d/c home with family. IV cath removed and intact. NO c/o pain at site or at this time.

## 2014-09-28 NOTE — Progress Notes (Signed)
Foley removed 09/27/14, patient was able to void, reports pain with urination, denies burning, reports voiding sufficient quantity.  Hospitalist e-paged.

## 2014-09-30 ENCOUNTER — Ambulatory Visit (INDEPENDENT_AMBULATORY_CARE_PROVIDER_SITE_OTHER): Payer: Medicaid Other | Admitting: Nurse Practitioner

## 2014-09-30 ENCOUNTER — Encounter (INDEPENDENT_AMBULATORY_CARE_PROVIDER_SITE_OTHER): Payer: Self-pay

## 2014-09-30 ENCOUNTER — Other Ambulatory Visit: Payer: Self-pay

## 2014-09-30 ENCOUNTER — Telehealth: Payer: Self-pay | Admitting: Internal Medicine

## 2014-09-30 ENCOUNTER — Encounter: Payer: Self-pay | Admitting: Nurse Practitioner

## 2014-09-30 DIAGNOSIS — Z86010 Personal history of colonic polyps: Secondary | ICD-10-CM | POA: Diagnosis not present

## 2014-09-30 DIAGNOSIS — R109 Unspecified abdominal pain: Secondary | ICD-10-CM | POA: Diagnosis not present

## 2014-09-30 LAB — OVA AND PARASITE EXAMINATION: Ova and parasites: NONE SEEN

## 2014-09-30 LAB — STOOL CULTURE

## 2014-09-30 MED ORDER — PEG-KCL-NACL-NASULF-NA ASC-C 100 G PO SOLR: 1.0000 | Freq: Once | ORAL | Status: AC

## 2014-09-30 NOTE — Progress Notes (Signed)
Quick Note:  . ______ 

## 2014-09-30 NOTE — Telephone Encounter (Signed)
Noted, patient seen at her appointment time.

## 2014-09-30 NOTE — Telephone Encounter (Signed)
Reviewed

## 2014-09-30 NOTE — Patient Instructions (Signed)
1. Have your labs drawn as soon as he can. 2. We will schedule your procedure for you. 3. Further recommendations to be based on the results of your procedure.

## 2014-09-30 NOTE — Progress Notes (Signed)
Referring Provider: Vesta Mixer Primary Care Physician:  Antionette Fairy, PA-C Primary GI:  Dr. Gala Romney  Chief Complaint  Patient presents with  . Abdominal Pain  . Nausea    HPI:   39 year old female presents for follow-up on abdominal pain, nausea and vomiting. She has an extensive, Katie past medical history. Last seen 08/27/2014 and at that time she was switched from Nexium to Protonix as she has had better results that in the past, treated constipation with Amitiza 24 mg 1-2 times daily with food, and requested records on previous colonoscopy.Marland Kitchen His colonoscopy showed 20 mm polyp which was piecemeal removed and recommended 6 months follow-up surveillance, which appears she did not follow through with. Labs done on last visit showed elevated white blood cell count, normal LFTs, normal TSH, normal tissue transglutaminase IgA. CT of the abdomen and pelvis was ordered which was completed approximately month later due to patient missing the CT appointment multiple times. CT showed no oxygenation for chronic intermittent epigastric pain and vomiting, also noted sequela of prior granulomatous disease.  Today she states she's still having significant pain. Was in the hospital. States pain is about a 9/10. She was given pain medicine in the hospital (Hydrocodone) and has asked about that. Was scheduled for a follow-up visit. Pain is upper abdominal and bilateral lower quadrants. It is constant, nothing makes it worse or better. Denies N/V. Has a bowel movement daily and consistent with [place] 3-4. Admits toilet tissue hematochezia, last occurrence a week ago. Denies melena. Patient states she had stool for heme done in the hosoital but no results are available for this. Stool studies in the hospital were negative as was H. Pylori antigen. Admits recent chills and low grade fever of 99 which was last noted during admission. Denies unintestinal weight loss. Denies chest pain, dyspnea, dizziness,  lightheadedness, syncope, near syncope. Denies any other upper or lower GI symptoms.  Past Medical History  Diagnosis Date  . Anxiety   . Depression   . Seizures     last seizure was 8 mo ago.   Marland Kitchen Herniated disc   . Chronic back pain   . Cervical cancer     age 33  . Leukemia     age 12  . Other abnormality of brain or central nervous system function study     ?MS, work up in progress  . ANA positive     ???  . HLD (hyperlipidemia)     Past Surgical History  Procedure Laterality Date  . Cholecystectomy    . Cesarean section    . Bone marrow transplant    . Back surgery  06/29/11    L4-5 microdiskectomy  . Intrauterine device insertion      removed  . Nose surgery    . Back surgery  2012  . Colonoscopy  06/2013    Dr. Arther Dames at [facility]: 20 mm sessile polyp removed from the proximal transverse colon, tubulovillous adenoma, 8 mm polyp from the distal transverse colon was tubular adenoma. No high-grade dysplasia. Recommended to have a six-month follow-up colonoscopy, she has not had this done.  . Esophagogastroduodenoscopy  January 2015    Dr. Arther Dames at [facility]. normal . bx negative for H.pylori    Current Outpatient Prescriptions  Medication Sig Dispense Refill  . albuterol (PROVENTIL HFA;VENTOLIN HFA) 108 (90 BASE) MCG/ACT inhaler Inhale 1-2 puffs into the lungs every 6 (six) hours as needed for wheezing or shortness of  breath. 1 Inhaler 0  . alprazolam (XANAX) 2 MG tablet Take 2 mg by mouth 3 (three) times daily.     . DULoxetine (CYMBALTA) 60 MG capsule Take 120 mg by mouth every morning.    Marland Kitchen HYDROcodone-acetaminophen (NORCO/VICODIN) 5-325 MG per tablet Take 1 tablet by mouth every 6 (six) hours as needed for moderate pain.    Marland Kitchen ketorolac (ACULAR) 0.5 % ophthalmic solution Place 1 drop into the left eye daily as needed (for pain).    . lubiprostone (AMITIZA) 24 MCG capsule Take 1 capsule (24 mcg total) by mouth 2 (two) times daily with a meal. 60  capsule 3  . methocarbamol (ROBAXIN) 500 MG tablet Take 500 mg by mouth 4 (four) times daily.    . naproxen sodium (ANAPROX) 550 MG tablet Take 550 mg by mouth 2 (two) times daily as needed for mild pain or moderate pain.    . nitroGLYCERIN (NITROSTAT) 0.4 MG SL tablet Place 0.4 mg under the tongue every 5 (five) minutes as needed for chest pain.    Marland Kitchen ondansetron (ZOFRAN) 4 MG tablet Take 4 mg by mouth every 8 (eight) hours as needed for nausea or vomiting.    Marland Kitchen oxcarbazepine (TRILEPTAL) 600 MG tablet Take 300-600 mg by mouth 2 (two) times daily. Takes 300mg  in the am and 600mg  incthe pm    . pantoprazole (PROTONIX) 40 MG tablet Take 1 tablet (40 mg total) by mouth daily. 30 tablet 3  . potassium chloride (K-DUR) 10 MEQ tablet Take 10 mEq by mouth every 12 (twelve) hours.    . rizatriptan (MAXALT) 10 MG tablet Take 10 mg by mouth as needed for migraine. May repeat in 2 hours if needed    . timolol (BETIMOL) 0.5 % ophthalmic solution Place 1 drop into the left eye daily as needed (for pressure/fluid).    Marland Kitchen zolpidem (AMBIEN CR) 12.5 MG CR tablet Take 12.5 mg by mouth at bedtime as needed for sleep.     No current facility-administered medications for this visit.    Allergies as of 09/30/2014 - Review Complete 09/26/2014  Allergen Reaction Noted  . Zetia [ezetimibe] Anaphylaxis 08/27/2014  . Hydromorphone Other (See Comments) 06/18/2014  . Morphine Nausea And Vomiting 06/18/2014  . Oxycodone Other (See Comments) 06/19/2011  . Codeine Nausea Only 08/27/2014    Family History  Problem Relation Age of Onset  . Diabetes Mother   . Cancer Maternal Grandmother     breast cancer  . Melanoma Father   . Diabetes Maternal Grandfather   . Stroke Maternal Grandfather   . Arthritis    . Colon cancer Neg Hx   . Skin cancer Mother     History   Social History  . Marital Status: Divorced    Spouse Name: N/A  . Number of Children: 2  . Years of Education: 14   Occupational History  .  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 Smoking status: Current Every Day Smoker -- 0.25 packs/day    Types: Cigarettes  . Smokeless tobacco: Never Used  . Alcohol Use: No  . Drug Use: No  . Sexual Activity: No   Other Topics Concern  . None   Social History Narrative    Review of Systems: General: Negative for anorexia, weight loss, fatigue, weakness. Eyes: Negative for vision changes.  ENT: Negative for hoarseness, difficulty swallowing. CV: Negative for chest pain, angina, palpitations, peripheral edema.  Respiratory: Negative  for dyspnea at rest, cough, wheezing.  GI: See history of present illness. Derm: Negative for rash or itching.  Endo: Negative for unusual weight change.  Heme: Negative for bruising or bleeding.   Physical Exam: BP 105/75 mmHg  Pulse 96  Temp(Src) 98.4 F (36.9 C) (Oral)  Ht 5\' 6"  (1.676 m)  Wt 148 lb (67.132 kg)  BMI 23.90 kg/m2  LMP 12/25/2013 General:   Alert and oriented. Pleasant and cooperative. Well-nourished and well-developed. Seems a bit drowsy and is holding her stomach. Head:  Normocephalic and atraumatic. Eyes:  Without icterus, sclera clear and conjunctiva pink.  Ears:  Normal auditory acuity. Cardiovascular:  S1, S2 present without murmurs appreciated. Normal pulses noted. Extremities without clubbing or edema. Respiratory:  Clear to auscultation bilaterally. No wheezes, rales, or rhonchi. No distress.  Gastrointestinal:  +BS, soft, and non-distended. Generalized abdominal TTP noted. No HSM noted. No guarding or rebound. No masses appreciated.  Rectal:  Deferred  Skin:  Intact without significant lesions or rashes. Neurologic:  Alert and oriented x4;  grossly normal neurologically. Psych:  Drowsy but awake and cooperative. Normal mood and affect. Heme/Lymph/Immune: No excessive bruising noted.    09/30/2014 9:20 AM

## 2014-09-30 NOTE — Telephone Encounter (Signed)
Noted  

## 2014-09-30 NOTE — Telephone Encounter (Signed)
Patient had [facility] Sunday morning saying she was here and didn't understand why we were not open. She had an appointment at 9 o'clock and wished she had known was closed before driving an hour from [place] to get here.  I called patient back before 8 am Monday morning after checking phone  Messages from the weekend and [facility] that our office is open on Mon-Friday and she has an appointment today (Monday) June 13 with Walden Field, NP at [facility].

## 2014-09-30 NOTE — Assessment & Plan Note (Signed)
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 Recommended 6 months follow-up which the patient did not follow through with. At this point she is much overdue for high risk early interval colonoscopy. We'll plan to set this up at this time. We'll also proceed with endoscopy due to her current presentation. Other recommendations following procedure.  Proceed with TCS and EGD with Dr. Gala Romney in near future: the risks, benefits, and alternatives have been discussed with the patient in detail. The patient states understanding and desires to proceed.  The patient is on multiple medications including Xanax, Cymbalta, hydrocodone, Robaxin, and Ambien. For this reason we will recommend the procedure in the OR with propofol due to polypharmacy. The patient is not on any anticoagulants.

## 2014-09-30 NOTE — Assessment & Plan Note (Signed)
Patient with persistent abdominal pain which she states is 910. Was recently in the hospital and given hydrocodone. She is asked about refills on her pain medication. No issues with constipation currently. Has toilet tissue hematochezia. 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 CT of the abdomen did not find any money for patient's symptoms. At this point we'll order a CBC, CMP, lipase. We'll also plan to complete a surveillance colonoscopy as she is due, as well as an endoscopy to further evaluate her epigastric abdominal pain component.  Proceed with TCS and EGD with Dr. Gala Romney in near future: the risks, benefits, and alternatives have been discussed with the patient in detail. The patient states understanding and desires to proceed.  The patient is on multiple medications including Xanax, Cymbalta, hydrocodone, Robaxin, and Ambien. For this reason we will recommend the procedure in the OR with propofol due to polypharmacy. The patient is not on any anticoagulants.

## 2014-10-01 LAB — COMPREHENSIVE METABOLIC PANEL
ALBUMIN: 3.6 g/dL (ref 3.5–5.2)
ALT: 14 U/L (ref 0–35)
AST: 16 U/L (ref 0–37)
Alkaline Phosphatase: 68 U/L (ref 39–117)
BILIRUBIN TOTAL: 0.4 mg/dL (ref 0.2–1.2)
BUN: 7 mg/dL (ref 6–23)
CO2: 25 meq/L (ref 19–32)
Calcium: 9 mg/dL (ref 8.4–10.5)
Chloride: 97 mEq/L (ref 96–112)
Creat: 0.6 mg/dL (ref 0.50–1.10)
GLUCOSE: 72 mg/dL (ref 70–99)
POTASSIUM: 4.5 meq/L (ref 3.5–5.3)
SODIUM: 129 meq/L — AB (ref 135–145)
Total Protein: 5.9 g/dL — ABNORMAL LOW (ref 6.0–8.3)

## 2014-10-01 LAB — CBC WITH DIFFERENTIAL/PLATELET
Basophils Absolute: 0 10*3/uL (ref 0.0–0.1)
Basophils Relative: 0 % (ref 0–1)
EOS ABS: 0.3 10*3/uL (ref 0.0–0.7)
Eosinophils Relative: 2 % (ref 0–5)
HEMATOCRIT: 34.4 % — AB (ref 36.0–46.0)
Hemoglobin: 11.2 g/dL — ABNORMAL LOW (ref 12.0–15.0)
Lymphocytes Relative: 25 % (ref 12–46)
Lymphs Abs: 3.3 10*3/uL (ref 0.7–4.0)
MCH: 28.4 pg (ref 26.0–34.0)
MCHC: 32.6 g/dL (ref 30.0–36.0)
MCV: 87.1 fL (ref 78.0–100.0)
MONOS PCT: 6 % (ref 3–12)
MPV: 10.3 fL (ref 8.6–12.4)
Monocytes Absolute: 0.8 10*3/uL (ref 0.1–1.0)
NEUTROS ABS: 8.8 10*3/uL — AB (ref 1.7–7.7)
NEUTROS PCT: 67 % (ref 43–77)
Platelets: 572 10*3/uL — ABNORMAL HIGH (ref 150–400)
RBC: 3.95 MIL/uL (ref 3.87–5.11)
RDW: 16.1 % — ABNORMAL HIGH (ref 11.5–15.5)
WBC: 13.2 10*3/uL — AB (ref 4.0–10.5)

## 2014-10-01 LAB — LIPASE: LIPASE: 30 U/L (ref 0–75)

## 2014-10-02 NOTE — Progress Notes (Signed)
cc'd to pcp 

## 2014-10-17 ENCOUNTER — Ambulatory Visit (HOSPITAL_COMMUNITY): Payer: No Typology Code available for payment source | Attending: Physician Assistant | Admitting: Physical Therapy

## 2014-10-18 ENCOUNTER — Encounter (HOSPITAL_COMMUNITY)
Admission: RE | Admit: 2014-10-18 | Discharge: 2014-10-18 | Disposition: A | Discharge status: Home / Self Care | Payer: Medicaid Other | Source: Ambulatory Visit | Attending: Internal Medicine | Admitting: Internal Medicine

## 2014-10-18 NOTE — Patient Instructions (Signed)
Lauren Cross  10/18/2014     @PREFPERIOPPHARMACY @   Your procedure is scheduled on 10/24/2014  Report to [facility] at  1200 PM.  Call this number if you have problems the morning of surgery:  [phone]   Remember:  Do not eat food or drink liquids after midnight.  Take these medicines the morning of surgery with A SIP OF WATER  Xanax, cymbalta, norco, robaxin, protonix, zoran, maxalt. Take your inhaler before you come.   Do not wear jewelry, make-up or nail polish.  Do not wear lotions, powders, or perfumes.    Do not shave 48 hours prior to surgery.  Men may shave face and neck.  Do not bring valuables to the hospital.  [facility] is not responsible for any belongings or valuables.  Contacts, dentures or bridgework may not be worn into surgery.  Leave your suitcase in the car.  After surgery it may be brought to your room.  For patients admitted to the hospital, discharge time will be determined by your treatment team.  Patients discharged the day of surgery will not be allowed to drive home.   Name and phone number of your driver:   family Special instructions:  Follow your prep instructions from Dr Roseanne Kaufman office.  Please read over the following fact sheets that you were given. Pain Booklet, Coughing and Deep Breathing, Surgical Site Infection Prevention, Anesthesia Post-op Instructions and Care and Recovery After Surgery      Esophagogastroduodenoscopy Esophagogastroduodenoscopy (EGD) is a procedure to examine the lining of the esophagus, stomach, and first part of the small intestine (duodenum). A long, flexible, lighted tube with a camera attached (endoscope) is inserted down the throat to view these organs. This procedure is done to detect problems or abnormalities, such as inflammation, bleeding, ulcers, or growths, in order to treat them. The procedure lasts about 5-20 minutes. It is usually an outpatient procedure, but it may need to be performed in  emergency cases in the hospital. LET YOUR CAREGIVER KNOW ABOUT:   Allergies to food or medicine.  All medicines you are taking, including vitamins, herbs, eyedrops, and over-the-counter medicines and creams.  Use of steroids (by mouth or creams).  Previous problems you or members of your family have had with the use of anesthetics.  Any blood disorders you have.  Previous surgeries you have had.  Other health problems you have.  Possibility of pregnancy, if this applies. RISKS AND COMPLICATIONS  Generally, EGD is a safe procedure. However, as with any procedure, complications can occur. Possible complications include:  Infection.  Bleeding.  Tearing (perforation) of the esophagus, stomach, or duodenum.  Difficulty breathing or not being able to breath.  Excessive sweating.  Spasms of the larynx.  Slowed heartbeat.  Low blood pressure. BEFORE THE PROCEDURE  Do not eat or drink anything for 6-8 hours before the procedure or as directed by your caregiver.  Ask your caregiver about changing or stopping your regular medicines.  If you wear dentures, be prepared to remove them before the procedure.  Arrange for someone to drive you home after the procedure. PROCEDURE   A vein will be accessed to give medicines and fluids. A medicine to relax you (sedative) and a pain reliever will be given through that access into the vein.  A numbing medicine (local anesthetic) may be sprayed on your throat for comfort and to stop you from gagging or coughing.  A mouth guard may be  placed in your mouth to protect your teeth and to keep you from biting on the endoscope.  You will be asked to lie on your left side.  The endoscope is inserted down your throat and into the esophagus, stomach, and duodenum.  Air is put through the endoscope to allow your caregiver to view the lining of your esophagus clearly.  The esophagus, stomach, and duodenum is then examined. During the exam,  your caregiver may:  Remove tissue to be examined under a microscope (biopsy) for inflammation, infection, or other medical problems.  Remove growths.  Remove objects (foreign bodies) that are stuck.  Treat any bleeding with medicines or other devices that stop tissues from bleeding (hot cautery, clipping devices).  Widen (dilate) or stretch narrowed areas of the esophagus and stomach.  The endoscope will then be withdrawn. AFTER THE PROCEDURE  You will be taken to a recovery area to be monitored. You will be able to go home once you are stable and alert.  Do not eat or drink anything until the local anesthetic and numbing medicines have worn off. You may choke.  It is normal to feel bloated, have pain with swallowing, or have a sore throat for a short time. This will wear off.  Your caregiver should be able to discuss his or her findings with you. It will take longer to discuss the test results if any biopsies were taken. Document Released: 08/06/2004 Document Revised: 08/20/2013 Document Reviewed: 03/08/2012 [facility] Patient Information 2015 [place], [place]. This information is not intended to replace advice given to you by your health care provider. Make sure you discuss any questions you have with your health care provider. Colonoscopy A colonoscopy is an exam to look at the entire large intestine (colon). This exam can help find problems such as tumors, polyps, inflammation, and areas of bleeding. The exam takes about 1 hour.  LET [facility] CARE PROVIDER KNOW ABOUT:   Any allergies you have.  All medicines you are taking, including vitamins, herbs, eye drops, creams, and over-the-counter medicines.  Previous problems you or members of your family have had with the use of anesthetics.  Any blood disorders you have.  Previous surgeries you have had.  Medical conditions you have. RISKS AND COMPLICATIONS  Generally, this is a safe procedure. However, as with any procedure,  complications can occur. Possible complications include:  Bleeding.  Tearing or rupture of the colon wall.  Reaction to medicines given during the exam.  Infection (rare). BEFORE THE PROCEDURE   Ask your health care provider about changing or stopping your regular medicines.  You may be prescribed an oral bowel prep. This involves drinking a large amount of medicated liquid, starting the day before your procedure. The liquid will cause you to have multiple loose stools until your stool is almost clear or light green. This cleans out your colon in preparation for the procedure.  Do not eat or drink anything else once you have started the bowel prep, unless your health care provider tells you it is safe to do so.  Arrange for someone to drive you home after the procedure. PROCEDURE   You will be given medicine to help you relax (sedative).  You will lie on your side with your knees bent.  A long, flexible tube with a light and camera on the end (colonoscope) will be inserted through the rectum and into the colon. The camera sends video back to a computer screen as it moves through the colon. The  colonoscope also releases carbon dioxide gas to inflate the colon. This helps your health care provider see the area better.  During the exam, your health care provider may take a small tissue sample (biopsy) to be examined under a microscope if any abnormalities are found.  The exam is finished when the entire colon has been viewed. AFTER THE PROCEDURE   Do not drive for 24 hours after the exam.  You may have a small amount of blood in your stool.  You may pass moderate amounts of gas and have mild abdominal cramping or bloating. This is caused by the gas used to inflate your colon during the exam.  Ask when your test results will be ready and how you will get your results. Make sure you get your test results. Document Released: 04/02/2000 Document Revised: 01/24/2013 Document Reviewed:  12/11/2012 [facility] Patient Information 2015 [place], [place]. This information is not intended to replace advice given to you by your health care provider. Make sure you discuss any questions you have with your health care provider. PATIENT INSTRUCTIONS POST-ANESTHESIA  IMMEDIATELY FOLLOWING SURGERY:  Do not drive or operate machinery for the first twenty four hours after surgery.  Do not make any important decisions for twenty four hours after surgery or while taking narcotic pain medications or sedatives.  If you develop intractable nausea and vomiting or a severe headache please notify your doctor immediately.  FOLLOW-UP:  Please make an appointment with your surgeon as instructed. You do not need to follow up with anesthesia unless specifically instructed to do so.  WOUND CARE INSTRUCTIONS (if applicable):  Keep a dry clean dressing on the anesthesia/puncture wound site if there is drainage.  Once the wound has quit draining you may leave it open to air.  Generally you should leave the bandage intact for twenty four hours unless there is drainage.  If the epidural site drains for more than 36-48 hours please call the anesthesia department.  QUESTIONS?:  Please feel free to call your physician or the hospital operator if you have any questions, and they will be happy to assist you.

## 2014-10-22 ENCOUNTER — Encounter (HOSPITAL_COMMUNITY): Payer: Self-pay

## 2014-10-22 ENCOUNTER — Encounter (HOSPITAL_COMMUNITY)
Admission: RE | Admit: 2014-10-22 | Discharge: 2014-10-22 | Disposition: A | Discharge status: Home / Self Care | Payer: Medicaid Other | Source: Ambulatory Visit | Attending: Internal Medicine | Admitting: Internal Medicine

## 2014-10-22 DIAGNOSIS — Z01818 Encounter for other preprocedural examination: Secondary | ICD-10-CM | POA: Diagnosis present

## 2014-10-22 HISTORY — DX: Other cervical disc displacement, unspecified cervical region: M50.20

## 2014-10-22 HISTORY — DX: Unspecified osteoarthritis, unspecified site: M19.90

## 2014-10-22 HISTORY — DX: Fracture of orbit, unspecified, initial encounter for closed fracture: S02.85XA

## 2014-10-22 HISTORY — DX: Gastro-esophageal reflux disease without esophagitis: K21.9

## 2014-10-22 LAB — CBC WITH DIFFERENTIAL/PLATELET
Basophils Absolute: 0.1 10*3/uL (ref 0.0–0.1)
Basophils Relative: 1 % (ref 0–1)
EOS ABS: 0.5 10*3/uL (ref 0.0–0.7)
EOS PCT: 4 % (ref 0–5)
HEMATOCRIT: 37.9 % (ref 36.0–46.0)
HEMOGLOBIN: 12.2 g/dL (ref 12.0–15.0)
LYMPHS ABS: 3.7 10*3/uL (ref 0.7–4.0)
Lymphocytes Relative: 30 % (ref 12–46)
MCH: 28.4 pg (ref 26.0–34.0)
MCHC: 32.2 g/dL (ref 30.0–36.0)
MCV: 88.3 fL (ref 78.0–100.0)
MONOS PCT: 6 % (ref 3–12)
Monocytes Absolute: 0.8 10*3/uL (ref 0.1–1.0)
NEUTROS PCT: 59 % (ref 43–77)
Neutro Abs: 7.2 10*3/uL (ref 1.7–7.7)
PLATELETS: 510 10*3/uL — AB (ref 150–400)
RBC: 4.29 MIL/uL (ref 3.87–5.11)
RDW: 16 % — ABNORMAL HIGH (ref 11.5–15.5)
WBC: 12.3 10*3/uL — ABNORMAL HIGH (ref 4.0–10.5)

## 2014-10-22 LAB — BASIC METABOLIC PANEL
ANION GAP: 9 (ref 5–15)
BUN: 7 mg/dL (ref 6–20)
CHLORIDE: 100 mmol/L — AB (ref 101–111)
CO2: 27 mmol/L (ref 22–32)
CREATININE: 0.71 mg/dL (ref 0.44–1.00)
Calcium: 9.7 mg/dL (ref 8.9–10.3)
GFR calc non Af Amer: >60 mL/min (ref >60)
Glucose, Bld: 91 mg/dL (ref 65–99)
POTASSIUM: 3.9 mmol/L (ref 3.5–5.1)
SODIUM: 136 mmol/L (ref 135–145)

## 2014-10-22 LAB — HCG, SERUM, QUALITATIVE: Preg, Serum: NEGATIVE

## 2014-10-22 NOTE — Patient Instructions (Signed)
Lauren Cross  10/22/2014     @PREFPERIOPPHARMACY @   Your procedure is scheduled on 10/24/2014  Report to [facility] at  1200 P.M.  Call this number if you have problems the morning of surgery:  [phone]   Remember:  Do not eat food or drink liquids after midnight.  Take these medicines the morning of surgery with A SIP OF WATER  Xanax, cymbalta, norco, robaxin, protonix, zofran, maxalt. Take your inhaler before you come.   Do not wear jewelry, make-up or nail polish.  Do not wear lotions, powders, or perfumes.    Do not shave 48 hours prior to surgery.  Men may shave face and neck.  Do not bring valuables to the hospital.  [facility] is not responsible for any belongings or valuables.  Contacts, dentures or bridgework may not be worn into surgery.  Leave your suitcase in the car.  After surgery it may be brought to your room.  For patients admitted to the hospital, discharge time will be determined by your treatment team.  Patients discharged the day of surgery will not be allowed to drive home.   Name and phone number of your driver:   family Special instructions:  Follow your prep and diet instructions that Dr Roseanne Kaufman office has given you.  Please read over the following fact sheets that you were given. Pain Booklet, Coughing and Deep Breathing, Surgical Site Infection Prevention, Anesthesia Post-op Instructions and Care and Recovery After Surgery      Esophagogastroduodenoscopy Esophagogastroduodenoscopy (EGD) is a procedure to examine the lining of the esophagus, stomach, and first part of the small intestine (duodenum). A long, flexible, lighted tube with a camera attached (endoscope) is inserted down the throat to view these organs. This procedure is done to detect problems or abnormalities, such as inflammation, bleeding, ulcers, or growths, in order to treat them. The procedure lasts about 5-20 minutes. It is usually an outpatient procedure, but it may  need to be performed in emergency cases in the hospital. LET YOUR CAREGIVER KNOW ABOUT:   Allergies to food or medicine.  All medicines you are taking, including vitamins, herbs, eyedrops, and over-the-counter medicines and creams.  Use of steroids (by mouth or creams).  Previous problems you or members of your family have had with the use of anesthetics.  Any blood disorders you have.  Previous surgeries you have had.  Other health problems you have.  Possibility of pregnancy, if this applies. RISKS AND COMPLICATIONS  Generally, EGD is a safe procedure. However, as with any procedure, complications can occur. Possible complications include:  Infection.  Bleeding.  Tearing (perforation) of the esophagus, stomach, or duodenum.  Difficulty breathing or not being able to breath.  Excessive sweating.  Spasms of the larynx.  Slowed heartbeat.  Low blood pressure. BEFORE THE PROCEDURE  Do not eat or drink anything for 6-8 hours before the procedure or as directed by your caregiver.  Ask your caregiver about changing or stopping your regular medicines.  If you wear dentures, be prepared to remove them before the procedure.  Arrange for someone to drive you home after the procedure. PROCEDURE   A vein will be accessed to give medicines and fluids. A medicine to relax you (sedative) and a pain reliever will be given through that access into the vein.  A numbing medicine (local anesthetic) may be sprayed on your throat for comfort and to stop you from gagging or coughing.  A mouth guard may be placed in your mouth to protect your teeth and to keep you from biting on the endoscope.  You will be asked to lie on your left side.  The endoscope is inserted down your throat and into the esophagus, stomach, and duodenum.  Air is put through the endoscope to allow your caregiver to view the lining of your esophagus clearly.  The esophagus, stomach, and duodenum is then  examined. During the exam, your caregiver may:  Remove tissue to be examined under a microscope (biopsy) for inflammation, infection, or other medical problems.  Remove growths.  Remove objects (foreign bodies) that are stuck.  Treat any bleeding with medicines or other devices that stop tissues from bleeding (hot cautery, clipping devices).  Widen (dilate) or stretch narrowed areas of the esophagus and stomach.  The endoscope will then be withdrawn. AFTER THE PROCEDURE  You will be taken to a recovery area to be monitored. You will be able to go home once you are stable and alert.  Do not eat or drink anything until the local anesthetic and numbing medicines have worn off. You may choke.  It is normal to feel bloated, have pain with swallowing, or have a sore throat for a short time. This will wear off.  Your caregiver should be able to discuss his or her findings with you. It will take longer to discuss the test results if any biopsies were taken. Document Released: 08/06/2004 Document Revised: 08/20/2013 Document Reviewed: 03/08/2012 [facility] Patient Information 2015 [place], [place]. This information is not intended to replace advice given to you by your health care provider. Make sure you discuss any questions you have with your health care provider. Colonoscopy A colonoscopy is an exam to look at the entire large intestine (colon). This exam can help find problems such as tumors, polyps, inflammation, and areas of bleeding. The exam takes about 1 hour.  LET [facility] CARE PROVIDER KNOW ABOUT:   Any allergies you have.  All medicines you are taking, including vitamins, herbs, eye drops, creams, and over-the-counter medicines.  Previous problems you or members of your family have had with the use of anesthetics.  Any blood disorders you have.  Previous surgeries you have had.  Medical conditions you have. RISKS AND COMPLICATIONS  Generally, this is a safe procedure.  However, as with any procedure, complications can occur. Possible complications include:  Bleeding.  Tearing or rupture of the colon wall.  Reaction to medicines given during the exam.  Infection (rare). BEFORE THE PROCEDURE   Ask your health care provider about changing or stopping your regular medicines.  You may be prescribed an oral bowel prep. This involves drinking a large amount of medicated liquid, starting the day before your procedure. The liquid will cause you to have multiple loose stools until your stool is almost clear or light green. This cleans out your colon in preparation for the procedure.  Do not eat or drink anything else once you have started the bowel prep, unless your health care provider tells you it is safe to do so.  Arrange for someone to drive you home after the procedure. PROCEDURE   You will be given medicine to help you relax (sedative).  You will lie on your side with your knees bent.  A long, flexible tube with a light and camera on the end (colonoscope) will be inserted through the rectum and into the colon. The camera sends video back to a computer screen as it  moves through the colon. The colonoscope also releases carbon dioxide gas to inflate the colon. This helps your health care provider see the area better.  During the exam, your health care provider may take a small tissue sample (biopsy) to be examined under a microscope if any abnormalities are found.  The exam is finished when the entire colon has been viewed. AFTER THE PROCEDURE   Do not drive for 24 hours after the exam.  You may have a small amount of blood in your stool.  You may pass moderate amounts of gas and have mild abdominal cramping or bloating. This is caused by the gas used to inflate your colon during the exam.  Ask when your test results will be ready and how you will get your results. Make sure you get your test results. Document Released: 04/02/2000 Document Revised:  01/24/2013 Document Reviewed: 12/11/2012 [facility] Patient Information 2015 [place], [place]. This information is not intended to replace advice given to you by your health care provider. Make sure you discuss any questions you have with your health care provider. PATIENT INSTRUCTIONS POST-ANESTHESIA  IMMEDIATELY FOLLOWING SURGERY:  Do not drive or operate machinery for the first twenty four hours after surgery.  Do not make any important decisions for twenty four hours after surgery or while taking narcotic pain medications or sedatives.  If you develop intractable nausea and vomiting or a severe headache please notify your doctor immediately.  FOLLOW-UP:  Please make an appointment with your surgeon as instructed. You do not need to follow up with anesthesia unless specifically instructed to do so.  WOUND CARE INSTRUCTIONS (if applicable):  Keep a dry clean dressing on the anesthesia/puncture wound site if there is drainage.  Once the wound has quit draining you may leave it open to air.  Generally you should leave the bandage intact for twenty four hours unless there is drainage.  If the epidural site drains for more than 36-48 hours please call the anesthesia department.  QUESTIONS?:  Please feel free to call your physician or the hospital operator if you have any questions, and they will be happy to assist you.

## 2014-10-22 NOTE — Pre-Procedure Instructions (Signed)
Patient given information to sign up for my chart at home. 

## 2014-10-24 ENCOUNTER — Encounter (HOSPITAL_COMMUNITY): Admission: RE | Payer: Self-pay | Source: Ambulatory Visit

## 2014-10-24 ENCOUNTER — Ambulatory Visit (HOSPITAL_COMMUNITY): Admission: RE | Admit: 2014-10-24 | Payer: Medicaid Other | Source: Ambulatory Visit | Admitting: Internal Medicine

## 2014-10-24 ENCOUNTER — Telehealth: Payer: Self-pay

## 2014-10-24 SURGERY — COLONOSCOPY WITH PROPOFOL
Anesthesia: Monitor Anesthesia Care

## 2014-10-24 NOTE — Telephone Encounter (Signed)
Pt called office this am and LMOM and states that she is not coming to procedure today. States she is sick from the prep. Called pt back x2 and LMOM. Unable to reach via phone.

## 2014-10-25 NOTE — Telephone Encounter (Signed)
Noted  

## 2014-11-08 NOTE — Progress Notes (Signed)
Quick Note:  Please let patient know that Dr. Gala Romney wants her to follow up with her PCP for 2 adjacent lesions in the right kidney seen on CT. May need follow up CT in 6 months or so. Please send copy to PCP. ______

## 2014-11-11 NOTE — Progress Notes (Signed)
Quick Note:  LMOM to call. ______ 

## 2015-01-06 ENCOUNTER — Other Ambulatory Visit: Payer: Self-pay | Admitting: Gastroenterology

## 2015-05-03 ENCOUNTER — Other Ambulatory Visit: Payer: Self-pay | Admitting: Nurse Practitioner

## 2015-05-15 ENCOUNTER — Encounter: Payer: Self-pay | Admitting: Obstetrics and Gynecology

## 2015-05-23 ENCOUNTER — Encounter: Payer: Self-pay | Admitting: Obstetrics and Gynecology

## 2015-06-05 ENCOUNTER — Encounter: Payer: Self-pay | Admitting: *Deleted

## 2015-06-09 ENCOUNTER — Encounter: Payer: Self-pay | Admitting: Obstetrics & Gynecology

## 2015-08-21 ENCOUNTER — Other Ambulatory Visit: Payer: Self-pay | Admitting: Neurology

## 2015-08-21 DIAGNOSIS — G43019 Migraine without aura, intractable, without status migrainosus: Secondary | ICD-10-CM

## 2015-09-04 ENCOUNTER — Ambulatory Visit: Admission: RE | Admit: 2015-09-04 | Payer: Medicaid Other | Source: Ambulatory Visit

## 2015-09-26 ENCOUNTER — Ambulatory Visit
Admission: RE | Admit: 2015-09-26 | Discharge: 2015-09-26 | Disposition: A | Discharge status: Home / Self Care | Payer: Medicaid Other | Source: Ambulatory Visit | Attending: Neurology | Admitting: Neurology

## 2015-09-26 DIAGNOSIS — G43019 Migraine without aura, intractable, without status migrainosus: Secondary | ICD-10-CM | POA: Insufficient documentation

## 2015-09-26 MED ORDER — GADOBENATE DIMEGLUMINE 529 MG/ML IV SOLN
15.0000 mL | Freq: Once | INTRAVENOUS | Status: AC | PRN
  Administered 2015-09-26: 13 mL via INTRAVENOUS

## 2015-10-22 ENCOUNTER — Other Ambulatory Visit: Payer: Self-pay | Admitting: Nurse Practitioner

## 2016-03-19 ENCOUNTER — Other Ambulatory Visit: Payer: Self-pay | Admitting: Gastroenterology

## 2016-04-08 ENCOUNTER — Other Ambulatory Visit (HOSPITAL_COMMUNITY): Payer: Self-pay | Admitting: Physician Assistant

## 2016-04-08 DIAGNOSIS — Z1231 Encounter for screening mammogram for malignant neoplasm of breast: Secondary | ICD-10-CM

## 2016-04-22 ENCOUNTER — Ambulatory Visit (HOSPITAL_COMMUNITY): Payer: Medicaid Other

## 2016-04-28 IMAGING — DX DG ABDOMEN 2V
2 series · 2 of 2 positions shown · non-contrast
Comparison: Abdominal pelvic CT 09/20/2014.

CLINICAL DATA: Chronic abdominal pain.  History of cervical cancer.

EXAM:
ABDOMEN - 2 VIEW

[abdomen erect]
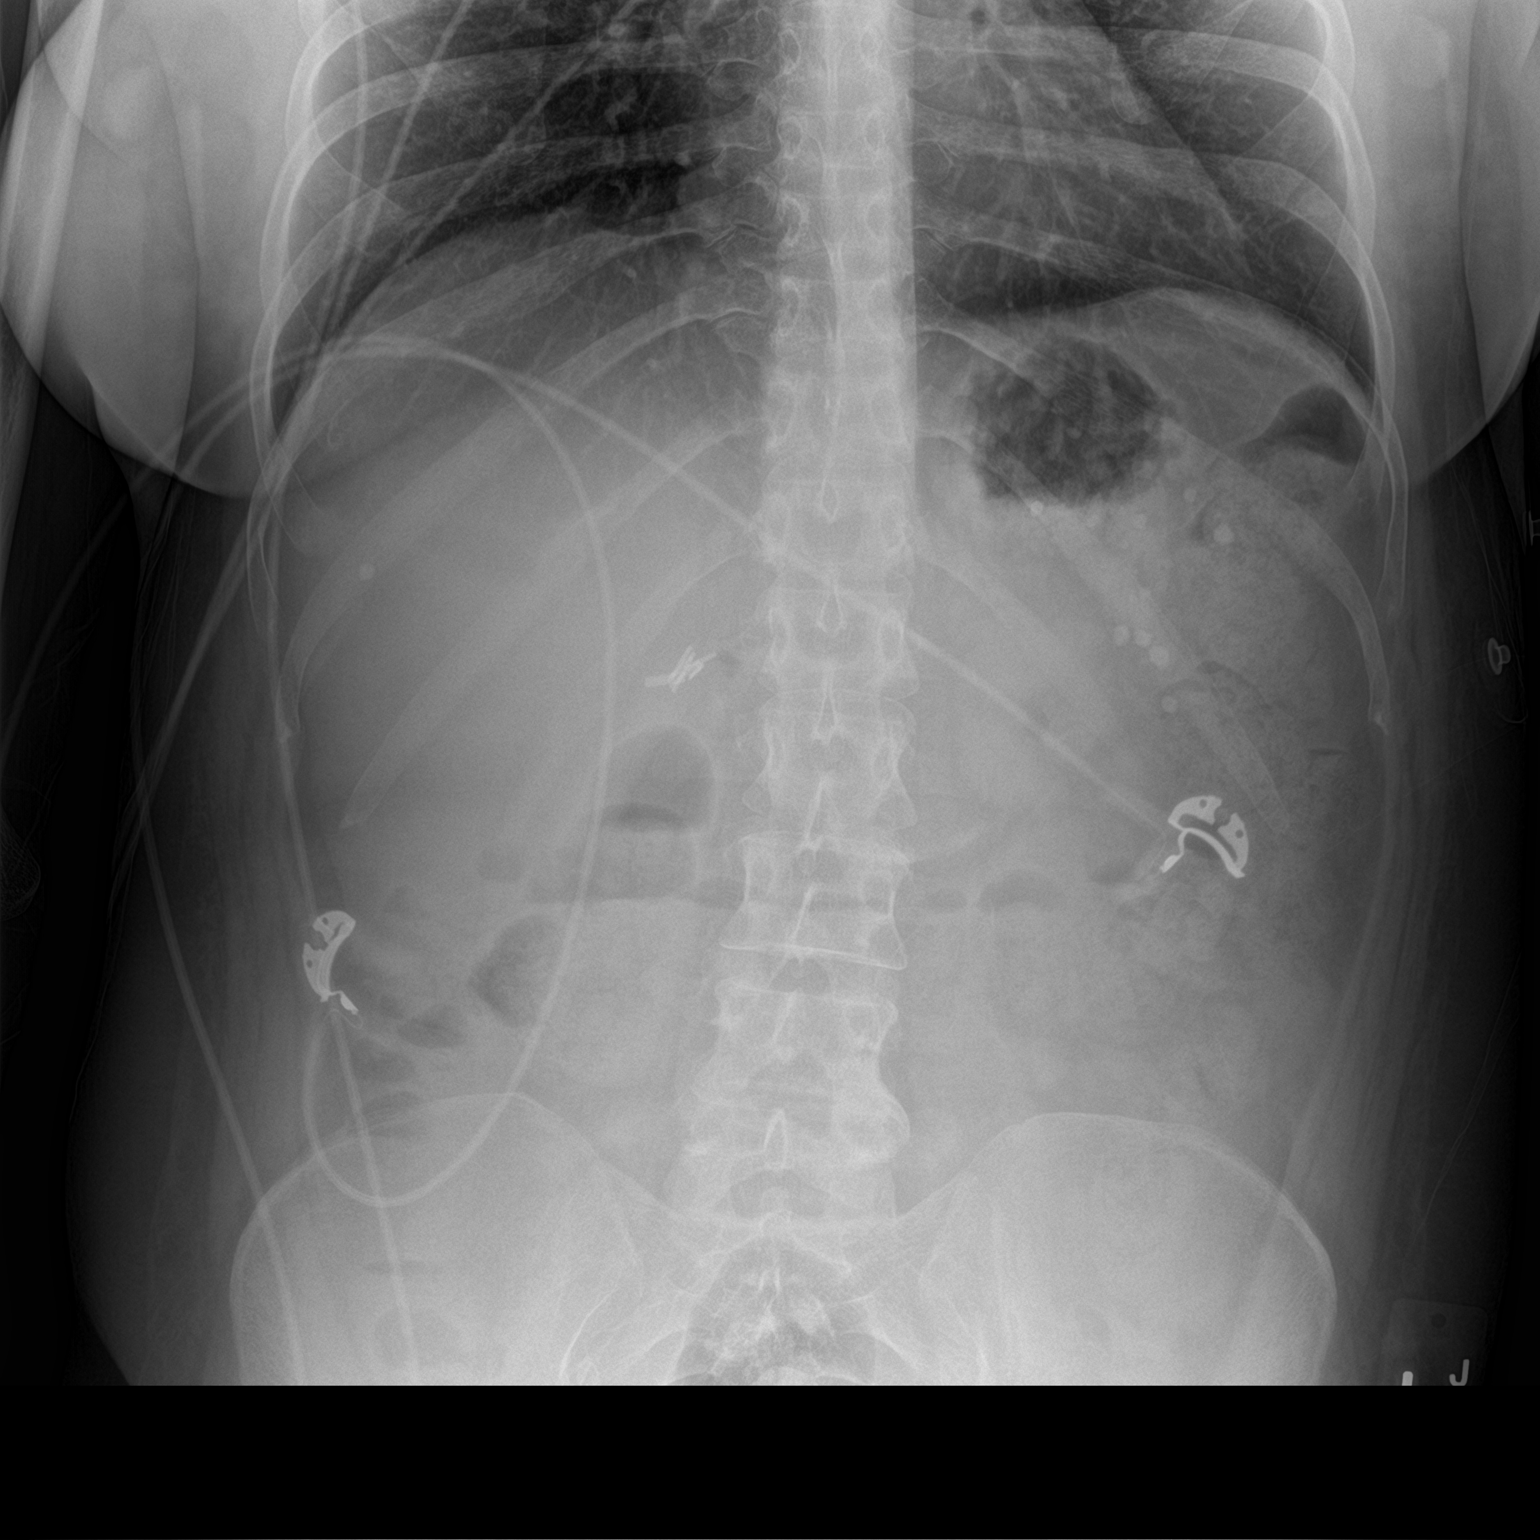

[abdomen supine]
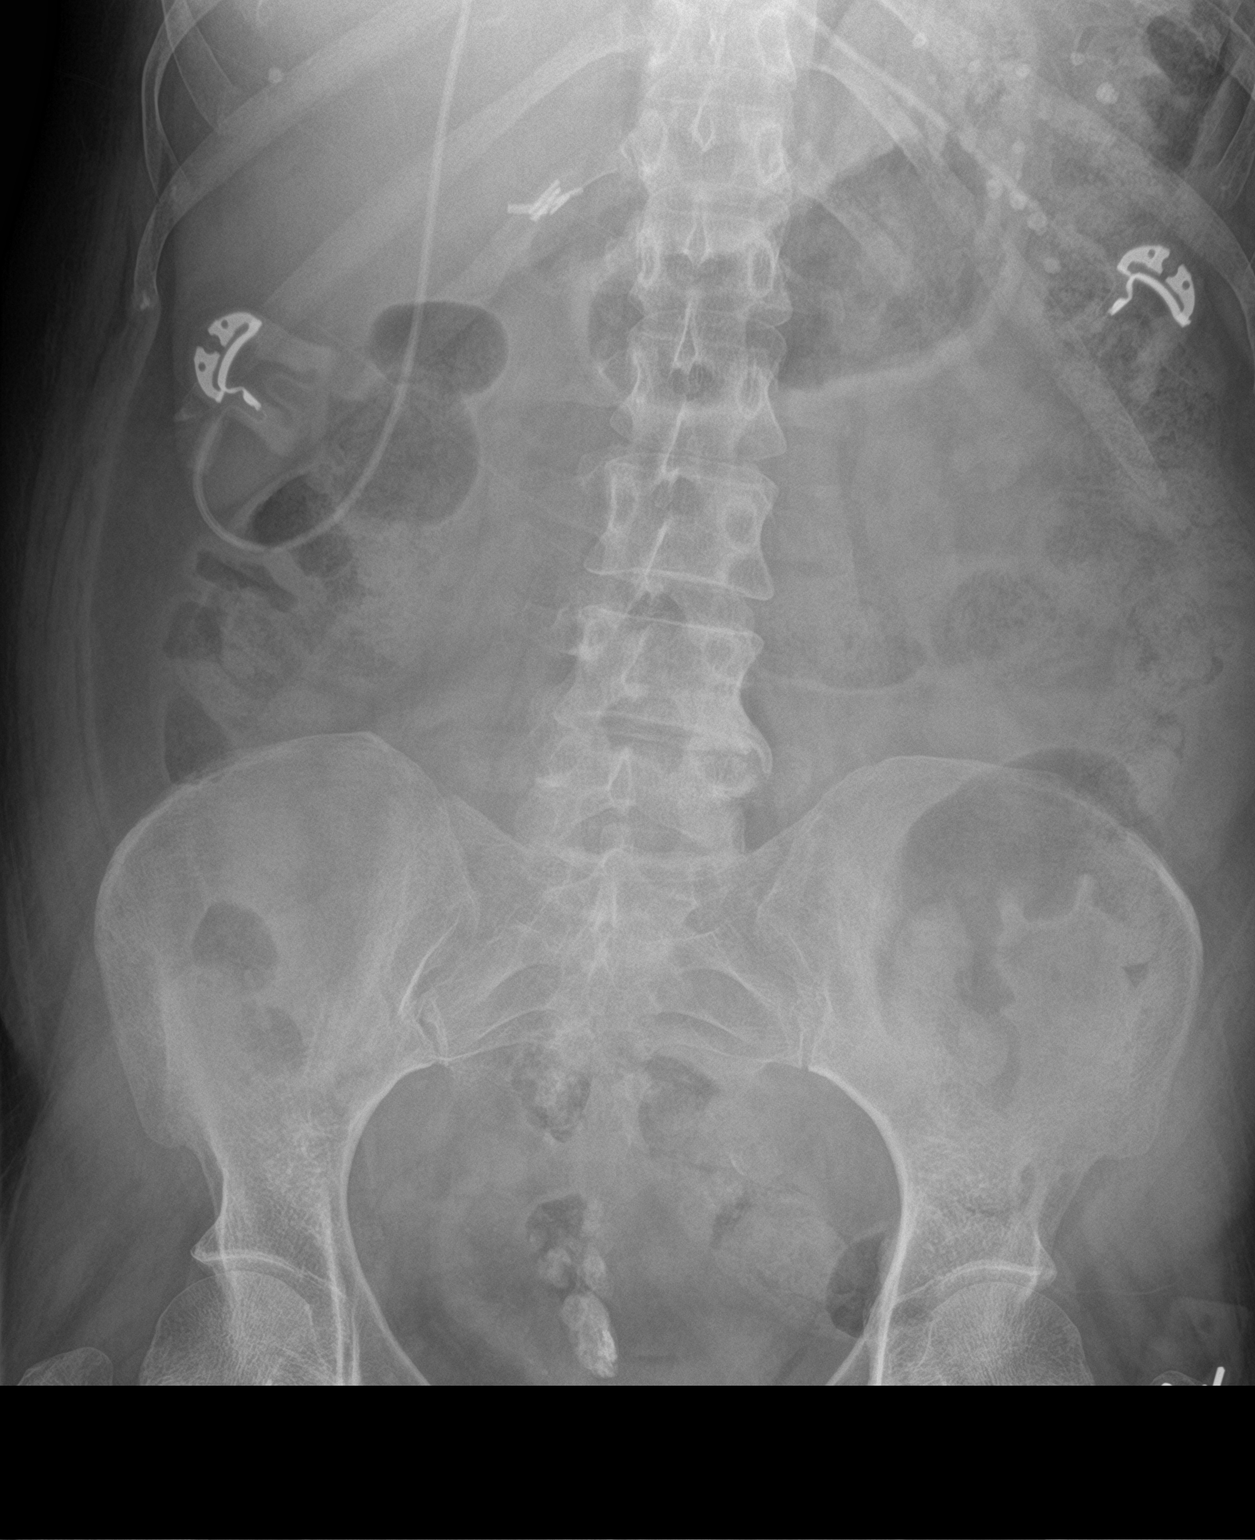

[2 of 2 positions shown; findings below may reference images not displayed]

FINDINGS: The bowel gas pattern is nonobstructive. There are a few scattered
air-fluid levels within nondistended bowel on the erect examination.
There is no bowel wall thickening or free intraperitoneal air.
Calcified granulomas within the liver and spleen and cholecystectomy
clips are noted. There is a small amount residual contrast material
within the distal colon. No worrisome osseous findings.
IMPRESSION: No acute abdominal findings.  No evidence of bowel obstruction.

## 2016-08-10 DIAGNOSIS — M5417 Radiculopathy, lumbosacral region: Secondary | ICD-10-CM | POA: Insufficient documentation

## 2016-11-04 ENCOUNTER — Encounter: Payer: Self-pay | Admitting: Orthopaedic Surgery

## 2016-11-04 ENCOUNTER — Ambulatory Visit (INDEPENDENT_AMBULATORY_CARE_PROVIDER_SITE_OTHER): Payer: Medicaid Other | Admitting: Orthopaedic Surgery

## 2016-11-04 VITALS — BP 132/86 | HR 97 | Temp 98.1°F | Ht 65.0 in | Wt 138.0 lb

## 2016-11-04 DIAGNOSIS — M25551 Pain in right hip: Secondary | ICD-10-CM

## 2016-11-04 MED ORDER — TRAMADOL HCL 50 MG PO TABS
50.0000 mg | ORAL_TABLET | Freq: Four times a day (QID) | ORAL | 0 refills | Status: DC | PRN
Start: 2016-11-04 — End: 2018-01-17

## 2016-11-04 MED ORDER — PREDNISONE 5 MG (21) PO TBPK: ORAL_TABLET | ORAL | 0 refills | Status: DC
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
## 2016-11-09 ENCOUNTER — Ambulatory Visit: Payer: Medicaid Other | Admitting: Orthopaedic Surgery

## 2016-11-16 ENCOUNTER — Ambulatory Visit (INDEPENDENT_AMBULATORY_CARE_PROVIDER_SITE_OTHER): Payer: Medicaid Other

## 2016-11-16 ENCOUNTER — Telehealth: Payer: Self-pay | Admitting: Orthopaedic Surgery

## 2016-11-16 ENCOUNTER — Ambulatory Visit (INDEPENDENT_AMBULATORY_CARE_PROVIDER_SITE_OTHER): Payer: Medicaid Other | Admitting: Orthopaedic Surgery

## 2016-11-16 ENCOUNTER — Encounter: Payer: Self-pay | Admitting: Orthopaedic Surgery

## 2016-11-16 VITALS — BP 104/76 | HR 88 | Temp 98.1°F | Ht 65.0 in | Wt 137.0 lb

## 2016-11-16 DIAGNOSIS — M25571 Pain in right ankle and joints of right foot: Secondary | ICD-10-CM

## 2016-11-16 DIAGNOSIS — M25551 Pain in right hip: Secondary | ICD-10-CM

## 2016-11-16 NOTE — Telephone Encounter (Signed)
As pt was checking out from her appointment today, she canceled her appointment for August 2nd.  She said she talked to the doctor and he told her that he couldn't help her with her hip pain
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
## 2016-11-18 ENCOUNTER — Ambulatory Visit: Payer: Medicaid Other | Admitting: Orthopaedic Surgery

## 2016-11-18 ENCOUNTER — Other Ambulatory Visit (HOSPITAL_COMMUNITY): Payer: Self-pay | Admitting: Physician Assistant

## 2016-11-18 DIAGNOSIS — N644 Mastodynia: Secondary | ICD-10-CM

## 2016-11-23 ENCOUNTER — Encounter (HOSPITAL_COMMUNITY): Payer: Self-pay | Admitting: Radiology

## 2016-11-23 ENCOUNTER — Ambulatory Visit (HOSPITAL_COMMUNITY)
Admission: RE | Admit: 2016-11-23 | Discharge: 2016-11-23 | Disposition: A | Discharge status: Home / Self Care | Payer: Medicaid Other | Source: Ambulatory Visit | Attending: Physician Assistant | Admitting: Physician Assistant

## 2016-11-23 DIAGNOSIS — N644 Mastodynia: Secondary | ICD-10-CM | POA: Diagnosis not present

## 2016-12-03 ENCOUNTER — Telehealth: Payer: Self-pay | Admitting: Orthopaedic Surgery

## 2016-12-03 NOTE — Telephone Encounter (Signed)
[place] faxed request has been received for request for refill:  traMADol (ULTRAM) 50 MG tablet 20 tablet    (Patient had elected to cancel her recent appointment (for 11/18/16) and has not re-scheduled.

## 2016-12-06 NOTE — Telephone Encounter (Signed)
No narcotics.  Take Advil, Aleve or Tylenol.

## 2016-12-07 NOTE — Telephone Encounter (Signed)
Called patient to notify, reached voice mail, left message for patient to return call.

## 2016-12-08 NOTE — Telephone Encounter (Signed)
No response from patient to voice messages.

## 2016-12-29 ENCOUNTER — Other Ambulatory Visit: Payer: Self-pay | Admitting: Gastroenterology

## 2017-01-26 ENCOUNTER — Telehealth (HOSPITAL_COMMUNITY): Payer: Self-pay | Admitting: Physical Therapy

## 2017-01-26 NOTE — Telephone Encounter (Signed)
Pt was called 3x to schedule. Called the patient for the third time, no answer and unable to l/m. Called Referring MD office spoke to Freda Munro told her this pt could not be reached and that this referral would be closed.NF 01/26/17

## 2017-03-01 ENCOUNTER — Other Ambulatory Visit: Payer: Self-pay | Admitting: Nurse Practitioner

## 2017-08-14 ENCOUNTER — Encounter (HOSPITAL_COMMUNITY): Payer: Self-pay | Admitting: Emergency Medicine

## 2017-08-14 ENCOUNTER — Other Ambulatory Visit: Payer: Self-pay

## 2017-08-14 ENCOUNTER — Emergency Department (HOSPITAL_COMMUNITY): Payer: Medicaid Other

## 2017-08-14 ENCOUNTER — Emergency Department (HOSPITAL_COMMUNITY)
Admission: EM | Admit: 2017-08-14 | Discharge: 2017-08-14 | Disposition: A | Discharge status: Home / Self Care | Payer: Medicaid Other | Attending: Emergency Medicine | Admitting: Emergency Medicine

## 2017-08-14 DIAGNOSIS — Z7982 Long term (current) use of aspirin: Secondary | ICD-10-CM | POA: Insufficient documentation

## 2017-08-14 DIAGNOSIS — I10 Essential (primary) hypertension: Secondary | ICD-10-CM | POA: Diagnosis not present

## 2017-08-14 DIAGNOSIS — N390 Urinary tract infection, site not specified: Secondary | ICD-10-CM

## 2017-08-14 DIAGNOSIS — F17210 Nicotine dependence, cigarettes, uncomplicated: Secondary | ICD-10-CM | POA: Insufficient documentation

## 2017-08-14 DIAGNOSIS — Z79899 Other long term (current) drug therapy: Secondary | ICD-10-CM | POA: Insufficient documentation

## 2017-08-14 DIAGNOSIS — E871 Hypo-osmolality and hyponatremia: Secondary | ICD-10-CM

## 2017-08-14 DIAGNOSIS — R339 Retention of urine, unspecified: Secondary | ICD-10-CM | POA: Diagnosis present

## 2017-08-14 HISTORY — DX: Other intervertebral disc displacement, lumbar region: M51.26

## 2017-08-14 HISTORY — DX: Disease of pericardium, unspecified: I31.9

## 2017-08-14 LAB — CBC
HEMATOCRIT: 34.9 % — AB (ref 36.0–46.0)
HEMOGLOBIN: 11.3 g/dL — AB (ref 12.0–15.0)
MCH: 29 pg (ref 26.0–34.0)
MCHC: 32.4 g/dL (ref 30.0–36.0)
MCV: 89.7 fL (ref 78.0–100.0)
Platelets: 405 10*3/uL — ABNORMAL HIGH (ref 150–400)
RBC: 3.89 MIL/uL (ref 3.87–5.11)
RDW: 15.4 % (ref 11.5–15.5)
WBC: 13.2 10*3/uL — ABNORMAL HIGH (ref 4.0–10.5)

## 2017-08-14 LAB — URINALYSIS, ROUTINE W REFLEX MICROSCOPIC
BILIRUBIN URINE: NEGATIVE
GLUCOSE, UA: NEGATIVE mg/dL
Hgb urine dipstick: NEGATIVE
KETONES UR: NEGATIVE mg/dL
NITRITE: NEGATIVE
Protein, ur: NEGATIVE mg/dL
SPECIFIC GRAVITY, URINE: 1.005 (ref 1.005–1.030)
WBC, UA: >50 WBC/hpf — ABNORMAL HIGH (ref 0–5)
pH: 9 — ABNORMAL HIGH (ref 5.0–8.0)

## 2017-08-14 LAB — COMPREHENSIVE METABOLIC PANEL
ALBUMIN: 3.1 g/dL — AB (ref 3.5–5.0)
ALK PHOS: 82 U/L (ref 38–126)
ALT: 21 U/L (ref 14–54)
ANION GAP: 11 (ref 5–15)
AST: 21 U/L (ref 15–41)
BILIRUBIN TOTAL: 0.6 mg/dL (ref 0.3–1.2)
BUN: <5 mg/dL — ABNORMAL LOW (ref 6–20)
CALCIUM: 8.6 mg/dL — AB (ref 8.9–10.3)
CO2: 24 mmol/L (ref 22–32)
Chloride: 93 mmol/L — ABNORMAL LOW (ref 101–111)
Creatinine, Ser: 0.55 mg/dL (ref 0.44–1.00)
GLUCOSE: 109 mg/dL — AB (ref 65–99)
POTASSIUM: 3.5 mmol/L (ref 3.5–5.1)
Sodium: 128 mmol/L — ABNORMAL LOW (ref 135–145)
TOTAL PROTEIN: 6.4 g/dL — AB (ref 6.5–8.1)

## 2017-08-14 MED ORDER — CEPHALEXIN 500 MG PO CAPS: 500.0000 mg | ORAL_CAPSULE | Freq: Three times a day (TID) | ORAL | 0 refills | Status: DC

## 2017-08-14 MED ORDER — CEPHALEXIN 500 MG PO CAPS
500.0000 mg | ORAL_CAPSULE | Freq: Once | ORAL | Status: AC
  Administered 2017-08-14: 500 mg via ORAL
  Filled 2017-08-14: qty 1

## 2017-08-14 MED ORDER — PHENAZOPYRIDINE HCL 200 MG PO TABS: 200.0000 mg | ORAL_TABLET | Freq: Three times a day (TID) | ORAL | 0 refills | Status: DC

## 2017-08-14 NOTE — Discharge Instructions (Signed)
Follow up with your doctor to recheck your sodium level.  Increase your salt intake and avoid a lot of free water.  Take the antibiotics as prescribed.  Return as needed for worsening symptoms
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
## 2017-08-14 NOTE — ED Triage Notes (Addendum)
Patient states that she was seen at [facility] on Friday after having a seizure in which she was transported via EMS. Patient states "I'm not sure what all EMS and the hospital gave me while I was there but since being I was there I have had swelling in my throat, stomach, and left hand." Patient also reporting ulcers in mouth. Per patient difficulty breathing with laying. Denies any difficulty swallowing or breathing while sitting/standing.  Per Hx of pericarditis. Patient states she has not voided since Friday.

## 2017-08-14 NOTE — ED Notes (Signed)
Patient transported to X-ray 

## 2017-09-07 DIAGNOSIS — M5416 Radiculopathy, lumbar region: Secondary | ICD-10-CM | POA: Insufficient documentation

## 2017-09-29 DIAGNOSIS — M4726 Other spondylosis with radiculopathy, lumbar region: Secondary | ICD-10-CM | POA: Insufficient documentation

## 2017-09-29 DIAGNOSIS — M5126 Other intervertebral disc displacement, lumbar region: Secondary | ICD-10-CM | POA: Insufficient documentation

## 2017-10-31 DIAGNOSIS — G40909 Epilepsy, unspecified, not intractable, without status epilepticus: Secondary | ICD-10-CM

## 2017-10-31 DIAGNOSIS — G629 Polyneuropathy, unspecified: Secondary | ICD-10-CM

## 2017-10-31 DIAGNOSIS — F419 Anxiety disorder, unspecified: Secondary | ICD-10-CM | POA: Diagnosis present

## 2017-11-04 ENCOUNTER — Encounter: Payer: Self-pay | Admitting: Orthopaedic Surgery

## 2017-11-15 ENCOUNTER — Other Ambulatory Visit (HOSPITAL_COMMUNITY): Payer: Self-pay | Admitting: Emergency Medicine

## 2017-11-15 ENCOUNTER — Other Ambulatory Visit: Payer: Self-pay | Admitting: Emergency Medicine

## 2017-11-15 DIAGNOSIS — S0083XD Contusion of other part of head, subsequent encounter: Secondary | ICD-10-CM

## 2017-11-17 ENCOUNTER — Ambulatory Visit (HOSPITAL_COMMUNITY)
Admission: RE | Admit: 2017-11-17 | Discharge: 2017-11-17 | Disposition: A | Discharge status: Home / Self Care | Payer: Medicaid Other | Source: Ambulatory Visit | Attending: Emergency Medicine | Admitting: Emergency Medicine

## 2017-11-17 DIAGNOSIS — X58XXXD Exposure to other specified factors, subsequent encounter: Secondary | ICD-10-CM | POA: Insufficient documentation

## 2017-11-17 DIAGNOSIS — S0083XD Contusion of other part of head, subsequent encounter: Secondary | ICD-10-CM | POA: Insufficient documentation

## 2017-11-29 ENCOUNTER — Ambulatory Visit: Payer: Medicaid Other | Admitting: Orthopaedic Surgery

## 2017-11-29 ENCOUNTER — Encounter: Payer: Self-pay | Admitting: Orthopaedic Surgery

## 2017-11-29 ENCOUNTER — Ambulatory Visit (INDEPENDENT_AMBULATORY_CARE_PROVIDER_SITE_OTHER): Payer: Medicaid Other

## 2017-11-29 VITALS — BP 119/82 | HR 115 | Ht 66.0 in | Wt 137.0 lb

## 2017-11-29 DIAGNOSIS — F17210 Nicotine dependence, cigarettes, uncomplicated: Secondary | ICD-10-CM | POA: Diagnosis not present

## 2017-11-29 DIAGNOSIS — M79671 Pain in right foot: Secondary | ICD-10-CM

## 2017-11-29 DIAGNOSIS — F1410 Cocaine abuse, uncomplicated: Secondary | ICD-10-CM

## 2017-11-29 DIAGNOSIS — S92254A Nondisplaced fracture of navicular [scaphoid] of right foot, initial encounter for closed fracture: Secondary | ICD-10-CM | POA: Diagnosis not present
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
## 2017-12-20 ENCOUNTER — Ambulatory Visit: Payer: Medicaid Other | Admitting: Orthopaedic Surgery

## 2017-12-20 ENCOUNTER — Encounter: Payer: Self-pay | Admitting: Orthopaedic Surgery

## 2017-12-25 ENCOUNTER — Emergency Department
Admission: EM | Admit: 2017-12-25 | Discharge: 2017-12-25 | Disposition: A | Discharge status: Home / Self Care | Payer: Medicaid Other | Attending: Emergency Medicine | Admitting: Emergency Medicine

## 2017-12-25 ENCOUNTER — Encounter: Payer: Self-pay | Admitting: Emergency Medicine

## 2017-12-25 ENCOUNTER — Emergency Department: Payer: Medicaid Other

## 2017-12-25 ENCOUNTER — Other Ambulatory Visit: Payer: Self-pay

## 2017-12-25 DIAGNOSIS — Z8541 Personal history of malignant neoplasm of cervix uteri: Secondary | ICD-10-CM | POA: Diagnosis not present

## 2017-12-25 DIAGNOSIS — R0789 Other chest pain: Secondary | ICD-10-CM | POA: Diagnosis not present

## 2017-12-25 DIAGNOSIS — F17210 Nicotine dependence, cigarettes, uncomplicated: Secondary | ICD-10-CM | POA: Diagnosis not present

## 2017-12-25 DIAGNOSIS — Z7982 Long term (current) use of aspirin: Secondary | ICD-10-CM | POA: Insufficient documentation

## 2017-12-25 DIAGNOSIS — E876 Hypokalemia: Secondary | ICD-10-CM | POA: Insufficient documentation

## 2017-12-25 DIAGNOSIS — I10 Essential (primary) hypertension: Secondary | ICD-10-CM | POA: Diagnosis not present

## 2017-12-25 DIAGNOSIS — R079 Chest pain, unspecified: Secondary | ICD-10-CM | POA: Diagnosis present

## 2017-12-25 DIAGNOSIS — Z79899 Other long term (current) drug therapy: Secondary | ICD-10-CM | POA: Insufficient documentation

## 2017-12-25 LAB — HEPATIC FUNCTION PANEL
ALK PHOS: 63 U/L (ref 38–126)
ALT: 12 U/L (ref 0–44)
AST: 17 U/L (ref 15–41)
Albumin: 3.3 g/dL — ABNORMAL LOW (ref 3.5–5.0)
BILIRUBIN TOTAL: 0.3 mg/dL (ref 0.3–1.2)
Bilirubin, Direct: <0.1 mg/dL (ref 0.0–0.2)
Total Protein: 6.4 g/dL — ABNORMAL LOW (ref 6.5–8.1)

## 2017-12-25 LAB — BASIC METABOLIC PANEL
Anion gap: 8 (ref 5–15)
BUN: 5 mg/dL — ABNORMAL LOW (ref 6–20)
CO2: 23 mmol/L (ref 22–32)
CREATININE: 0.53 mg/dL (ref 0.44–1.00)
Calcium: 8.2 mg/dL — ABNORMAL LOW (ref 8.9–10.3)
Chloride: 99 mmol/L (ref 98–111)
GFR calc non Af Amer: >60 mL/min (ref >60)
Glucose, Bld: 85 mg/dL (ref 70–99)
Potassium: 2.8 mmol/L — ABNORMAL LOW (ref 3.5–5.1)
Sodium: 130 mmol/L — ABNORMAL LOW (ref 135–145)

## 2017-12-25 LAB — URINALYSIS, COMPLETE (UACMP) WITH MICROSCOPIC
BACTERIA UA: NONE SEEN
BILIRUBIN URINE: NEGATIVE
GLUCOSE, UA: NEGATIVE mg/dL
HGB URINE DIPSTICK: NEGATIVE
Ketones, ur: NEGATIVE mg/dL
Nitrite: NEGATIVE
PH: 6 (ref 5.0–8.0)
Protein, ur: NEGATIVE mg/dL
Specific Gravity, Urine: 1.011 (ref 1.005–1.030)

## 2017-12-25 LAB — AMMONIA: Ammonia: 20 umol/L (ref 9–35)

## 2017-12-25 LAB — CBC
HCT: 35.2 % (ref 35.0–47.0)
HEMOGLOBIN: 12.2 g/dL (ref 12.0–16.0)
MCH: 30.5 pg (ref 26.0–34.0)
MCHC: 34.6 g/dL (ref 32.0–36.0)
MCV: 88 fL (ref 80.0–100.0)
PLATELETS: 397 10*3/uL (ref 150–440)
RBC: 4 MIL/uL (ref 3.80–5.20)
RDW: 15.5 % — ABNORMAL HIGH (ref 11.5–14.5)
WBC: 13.7 10*3/uL — ABNORMAL HIGH (ref 3.6–11.0)

## 2017-12-25 LAB — POCT PREGNANCY, URINE: Preg Test, Ur: NEGATIVE

## 2017-12-25 LAB — TROPONIN I: Troponin I: <0.03 ng/mL (ref <0.03)

## 2017-12-25 LAB — LIPASE, BLOOD: Lipase: 21 U/L (ref 11–51)

## 2017-12-25 MED ORDER — MAGNESIUM 30 MG PO TABS: 30.0000 mg | ORAL_TABLET | Freq: Two times a day (BID) | ORAL | 0 refills | Status: AC

## 2017-12-25 MED ORDER — POTASSIUM CHLORIDE ER 20 MEQ PO TBCR: 20.0000 meq | EXTENDED_RELEASE_TABLET | Freq: Two times a day (BID) | ORAL | 0 refills | Status: DC

## 2017-12-25 MED ORDER — SODIUM CHLORIDE 0.9 % IV BOLUS
500.0000 mL | Freq: Once | INTRAVENOUS | Status: AC
  Administered 2017-12-25: 500 mL via INTRAVENOUS

## 2017-12-25 MED ORDER — POTASSIUM CHLORIDE CRYS ER 20 MEQ PO TBCR
40.0000 meq | EXTENDED_RELEASE_TABLET | Freq: Once | ORAL | Status: AC
  Administered 2017-12-25: 40 meq via ORAL
  Filled 2017-12-25: qty 2
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
## 2017-12-25 NOTE — Discharge Instructions (Signed)
Return to the emergency room for any new or worrisome symptoms, take the potassium supplementation as they will make you feel better, follow closely with primary care.

## 2017-12-25 NOTE — ED Notes (Signed)
Pt's mother out to desk stating pt is ready to leave; would like her IV removed and to be discharged; in to speak with pt; she states she does not want to finish her IV fluids and she just wants to leave; pt says nobody cares about her-explained to pt that we evaluated her cardiac complaints, her abd pain with labs and urine-she was given potassium for her low potassium and IV fluids for her sodium which she has refused to complete; unable to reason with pt and mother regarding her visit today; pt told her mother to "just stop talking, you're wasting your time"; pt getting dressed as I'm removing her IV;

## 2017-12-25 NOTE — ED Notes (Signed)
Pt to xray via wheelchair

## 2017-12-25 NOTE — ED Notes (Signed)
First Nurse Note: Chest pain x 2 hours with numbness in left arm. Also c/o flank pain on left side all day. Reports BP elevated at home.

## 2017-12-25 NOTE — ED Notes (Signed)
XR to room pt to room 12 after chest XR. Family sitting in room.

## 2017-12-25 NOTE — ED Triage Notes (Signed)
Pt arrives via POV from home with c/o chest pain x 2 hours. Also flank pain all day. Pt states numbness in left arm.   Pt's mother reports pt needing to go to a neurologist. States pt was recently in the hospital in [place] and told her ammonia levels were high.

## 2017-12-30 ENCOUNTER — Encounter: Payer: Self-pay | Admitting: Internal Medicine

## 2018-01-17 ENCOUNTER — Ambulatory Visit: Payer: Medicaid Other | Admitting: Gastroenterology

## 2018-01-17 ENCOUNTER — Encounter: Payer: Self-pay | Admitting: Gastroenterology

## 2018-01-17 ENCOUNTER — Telehealth: Payer: Self-pay

## 2018-01-17 ENCOUNTER — Encounter

## 2018-01-17 ENCOUNTER — Other Ambulatory Visit: Payer: Self-pay

## 2018-01-17 ENCOUNTER — Ambulatory Visit (INDEPENDENT_AMBULATORY_CARE_PROVIDER_SITE_OTHER): Payer: Medicaid Other | Admitting: Internal Medicine

## 2018-01-17 ENCOUNTER — Telehealth: Payer: Self-pay | Admitting: General Practice

## 2018-01-17 ENCOUNTER — Encounter: Payer: Self-pay | Admitting: Internal Medicine

## 2018-01-17 ENCOUNTER — Telehealth: Payer: Self-pay | Admitting: Gastroenterology

## 2018-01-17 VITALS — BP 135/88 | HR 102 | Temp 97.8°F | Ht 65.0 in | Wt 142.0 lb

## 2018-01-17 DIAGNOSIS — Z86010 Personal history of colonic polyps: Secondary | ICD-10-CM | POA: Diagnosis not present

## 2018-01-17 DIAGNOSIS — R1012 Left upper quadrant pain: Secondary | ICD-10-CM

## 2018-01-17 MED ORDER — DEXLANSOPRAZOLE 60 MG PO CPDR: 60.0000 mg | DELAYED_RELEASE_CAPSULE | Freq: Every day | ORAL | 3 refills | Status: DC
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
## 2018-01-17 NOTE — Assessment & Plan Note (Addendum)
Two day history of LUQ pain/left-sided abdominal pain, reporting fever 101.6 yesterday but also has a sinus infection. Afebrile today, prior N/V/D now resolved for 2 weeks prior to this appt. Physical exam without guarding or rebound but does have tenderness. Recommended CT abd/pelvis today, but she is unable to complete due to conflicts with scheduling. CT abd/pelvis scheduled for next 24-48 hours to be completed. I anticipate constipation, possible gastritis, PUD playing a role. Doubt pancreatitis, unable to rule out diverticulitis although pain is not in lower quadrants. Further recommendations after CT.   Start Dexilant in the interim. Linzess 145 mcg samples also provided to start.  There is also some question of cirrhosis and chronic Hep C, but we do not have documentation of this nor is the patient aware. Hep C antibody, Hep B serologies, Hep A total antibody, CBC, CMP ordered today.

## 2018-01-17 NOTE — Telephone Encounter (Signed)
Pt is scheduled for ASAP CT abd/pelvis 01/19/18 (she was unable to have CT today or tomorrow).  Submitted PA for CT via [facility]. Case pending. Service Order: [id number]. Will upload clinical notes after OV note is completed.

## 2018-01-17 NOTE — Assessment & Plan Note (Signed)
42 year old female with history of advanced adenoma in 2015 at outside facility and overdue for surveillance. Presenting with multiple issues today including acute abdominal pain. See "abdominal pain, LUQ". Will address this first and then proceed with surveillance using Propofol in the future.

## 2018-01-17 NOTE — Patient Instructions (Signed)
Please complete CT today.  I have also ordered blood work to be done today.  I have sent in [place] to take once each morning for reflux instead of Protonix.  For constipation, I am giving you samples called Linzess. Take this once each morning on an empty stomach, 30 minutes before breakfast. This can have some loose stool when starting but should get better. If not, call me. Let me know how it works for you!  You will need a colonoscopy in the near future. We are ensuring nothing acute is going on currently. You may need an upper endoscopy.  It was a pleasure to see you today. I strive to create trusting relationships with patients to provide genuine, compassionate, and quality care. I value your feedback. If you receive a survey regarding your visit,  I greatly appreciate you taking time to fill this out.   Annitta Needs, PhD, ANP-BC [facility]

## 2018-01-17 NOTE — Telephone Encounter (Signed)
Per the referral the patient would like to see Dr. Oneida Alar  I received the approval from Dr. Oneida Alar that she will accept her as a patient.

## 2018-01-17 NOTE — Telephone Encounter (Signed)
Randall Hiss and Magda Paganini: CT will be done latter part of this week due to abdominal pain. FYI, as I won't be here to review.

## 2018-01-18 ENCOUNTER — Telehealth: Payer: Self-pay

## 2018-01-18 NOTE — Telephone Encounter (Signed)
Called [employer] to expedite case review as CT is scheduled for tomorrow. Intake rep marked case as urgent. 4 hours allowed for decision.

## 2018-01-18 NOTE — Telephone Encounter (Signed)
Pt called and said the Dexilant was too expensive. I told her to call tomorrow afternoon, hopefully we will have samples then. I will leave her a few until I can speak with pharmacy about the medication.

## 2018-01-18 NOTE — Telephone Encounter (Signed)
CT approved. PA# [id number], 01/17/18-02/16/18.

## 2018-01-18 NOTE — Progress Notes (Signed)
cc'd to pcp 

## 2018-01-18 NOTE — Telephone Encounter (Signed)
Clinical notes uploaded to case.

## 2018-01-19 ENCOUNTER — Ambulatory Visit (HOSPITAL_COMMUNITY)
Admission: RE | Admit: 2018-01-19 | Discharge: 2018-01-19 | Disposition: A | Discharge status: Home / Self Care | Payer: Medicaid Other | Source: Ambulatory Visit | Attending: Gastroenterology | Admitting: Gastroenterology

## 2018-01-19 ENCOUNTER — Observation Stay (HOSPITAL_COMMUNITY)
Admission: EM | Admit: 2018-01-19 | Discharge: 2018-01-20 | Disposition: A | Discharge status: Home / Self Care | Payer: Medicaid Other | Attending: Internal Medicine | Admitting: Internal Medicine

## 2018-01-19 ENCOUNTER — Other Ambulatory Visit: Payer: Self-pay

## 2018-01-19 ENCOUNTER — Encounter (HOSPITAL_COMMUNITY): Payer: Self-pay | Admitting: Emergency Medicine

## 2018-01-19 ENCOUNTER — Telehealth: Payer: Self-pay

## 2018-01-19 ENCOUNTER — Other Ambulatory Visit: Payer: Self-pay | Admitting: Nurse Practitioner

## 2018-01-19 DIAGNOSIS — D473 Essential (hemorrhagic) thrombocythemia: Secondary | ICD-10-CM | POA: Diagnosis not present

## 2018-01-19 DIAGNOSIS — G40909 Epilepsy, unspecified, not intractable, without status epilepticus: Secondary | ICD-10-CM | POA: Diagnosis not present

## 2018-01-19 DIAGNOSIS — E871 Hypo-osmolality and hyponatremia: Secondary | ICD-10-CM | POA: Diagnosis not present

## 2018-01-19 DIAGNOSIS — G9389 Other specified disorders of brain: Secondary | ICD-10-CM | POA: Diagnosis not present

## 2018-01-19 DIAGNOSIS — F419 Anxiety disorder, unspecified: Secondary | ICD-10-CM | POA: Diagnosis not present

## 2018-01-19 DIAGNOSIS — R079 Chest pain, unspecified: Secondary | ICD-10-CM | POA: Diagnosis present

## 2018-01-19 DIAGNOSIS — K5900 Constipation, unspecified: Secondary | ICD-10-CM | POA: Diagnosis not present

## 2018-01-19 DIAGNOSIS — D75839 Thrombocytosis, unspecified: Secondary | ICD-10-CM

## 2018-01-19 DIAGNOSIS — N83201 Unspecified ovarian cyst, right side: Secondary | ICD-10-CM

## 2018-01-19 DIAGNOSIS — F17210 Nicotine dependence, cigarettes, uncomplicated: Secondary | ICD-10-CM | POA: Diagnosis not present

## 2018-01-19 DIAGNOSIS — R112 Nausea with vomiting, unspecified: Secondary | ICD-10-CM | POA: Diagnosis present

## 2018-01-19 DIAGNOSIS — F418 Other specified anxiety disorders: Secondary | ICD-10-CM | POA: Diagnosis present

## 2018-01-19 DIAGNOSIS — K219 Gastro-esophageal reflux disease without esophagitis: Secondary | ICD-10-CM | POA: Diagnosis not present

## 2018-01-19 DIAGNOSIS — K760 Fatty (change of) liver, not elsewhere classified: Secondary | ICD-10-CM | POA: Insufficient documentation

## 2018-01-19 DIAGNOSIS — M549 Dorsalgia, unspecified: Secondary | ICD-10-CM

## 2018-01-19 DIAGNOSIS — R1012 Left upper quadrant pain: Secondary | ICD-10-CM

## 2018-01-19 DIAGNOSIS — I10 Essential (primary) hypertension: Secondary | ICD-10-CM | POA: Diagnosis not present

## 2018-01-19 DIAGNOSIS — Z23 Encounter for immunization: Secondary | ICD-10-CM | POA: Diagnosis not present

## 2018-01-19 DIAGNOSIS — D72829 Elevated white blood cell count, unspecified: Secondary | ICD-10-CM | POA: Diagnosis not present

## 2018-01-19 DIAGNOSIS — G8929 Other chronic pain: Secondary | ICD-10-CM

## 2018-01-19 DIAGNOSIS — R109 Unspecified abdominal pain: Secondary | ICD-10-CM | POA: Diagnosis present

## 2018-01-19 LAB — BASIC METABOLIC PANEL
ANION GAP: 8 (ref 5–15)
BUN: 6 mg/dL (ref 6–20)
CALCIUM: 12.2 mg/dL — AB (ref 8.9–10.3)
CO2: 25 mmol/L (ref 22–32)
Chloride: 90 mmol/L — ABNORMAL LOW (ref 98–111)
Creatinine, Ser: 0.84 mg/dL (ref 0.44–1.00)
GFR calc non Af Amer: >60 mL/min (ref >60)
Glucose, Bld: 91 mg/dL (ref 70–99)
Potassium: 4.3 mmol/L (ref 3.5–5.1)
Sodium: 123 mmol/L — ABNORMAL LOW (ref 135–145)

## 2018-01-19 LAB — AMMONIA: Ammonia: 32 umol/L (ref 9–35)

## 2018-01-19 MED ORDER — MIRTAZAPINE 30 MG PO TABS
45.0000 mg | ORAL_TABLET | Freq: Every day | ORAL | Status: DC
  Administered 2018-01-19: 45 mg via ORAL

## 2018-01-19 MED ORDER — PROMETHAZINE HCL 12.5 MG PO TABS: 12.5000 mg | ORAL_TABLET | Freq: Four times a day (QID) | ORAL | Status: DC | PRN

## 2018-01-19 MED ORDER — PRAZOSIN HCL 1 MG PO CAPS
ORAL_CAPSULE | ORAL | Status: AC
  Filled 2018-01-19: qty 2

## 2018-01-19 MED ORDER — ALPRAZOLAM 1 MG PO TABS
2.0000 mg | ORAL_TABLET | Freq: Four times a day (QID) | ORAL | Status: DC | PRN
  Administered 2018-01-19 – 2018-01-20 (×2): 2 mg via ORAL
  Filled 2018-01-19 (×2): qty 2

## 2018-01-19 MED ORDER — PANTOPRAZOLE SODIUM 40 MG PO TBEC
40.0000 mg | DELAYED_RELEASE_TABLET | Freq: Every day | ORAL | Status: DC
  Administered 2018-01-20: 40 mg via ORAL
  Filled 2018-01-19: qty 1

## 2018-01-19 MED ORDER — DIPHENHYDRAMINE HCL 25 MG PO TABS
25.0000 mg | ORAL_TABLET | Freq: Two times a day (BID) | ORAL | Status: DC
  Administered 2018-01-19 – 2018-01-20 (×2): 25 mg via ORAL
  Filled 2018-01-19 (×10): qty 1

## 2018-01-19 MED ORDER — GABAPENTIN 600 MG PO TABS
600.0000 mg | ORAL_TABLET | Freq: Three times a day (TID) | ORAL | Status: DC
  Filled 2018-01-19 (×2): qty 1

## 2018-01-19 MED ORDER — METHOCARBAMOL 500 MG PO TABS
750.0000 mg | ORAL_TABLET | ORAL | Status: DC
Start: 2018-01-19 — End: 2018-01-20
  Administered 2018-01-19 – 2018-01-20 (×4): 750 mg via ORAL
  Filled 2018-01-19 (×4): qty 2

## 2018-01-19 MED ORDER — MIRTAZAPINE 15 MG PO TBDP
ORAL_TABLET | ORAL | Status: AC
  Filled 2018-01-19: qty 1

## 2018-01-19 MED ORDER — LINACLOTIDE 145 MCG PO CAPS
145.0000 ug | ORAL_CAPSULE | Freq: Every day | ORAL | Status: DC
  Administered 2018-01-20: 145 ug via ORAL
  Filled 2018-01-19: qty 1

## 2018-01-19 MED ORDER — ENOXAPARIN SODIUM 40 MG/0.4ML ~~LOC~~ SOLN
40.0000 mg | SUBCUTANEOUS | Status: DC
  Administered 2018-01-20: 40 mg via SUBCUTANEOUS
  Filled 2018-01-19: qty 0.4

## 2018-01-19 MED ORDER — INFLUENZA VAC SPLIT QUAD 0.5 ML IM SUSY
0.5000 mL | PREFILLED_SYRINGE | INTRAMUSCULAR | Status: AC
  Administered 2018-01-20: 0.5 mL via INTRAMUSCULAR
  Filled 2018-01-19: qty 0.5

## 2018-01-19 MED ORDER — PRAZOSIN HCL 1 MG PO CAPS
2.0000 mg | ORAL_CAPSULE | Freq: Every day | ORAL | Status: DC
  Administered 2018-01-19: 2 mg via ORAL
  Filled 2018-01-19: qty 2
  Filled 2018-01-19: qty 1
  Filled 2018-01-19 (×2): qty 2

## 2018-01-19 MED ORDER — SODIUM CHLORIDE 0.9 % IV BOLUS
500.0000 mL | Freq: Once | INTRAVENOUS | Status: AC
  Administered 2018-01-19: 500 mL via INTRAVENOUS

## 2018-01-19 MED ORDER — OXCARBAZEPINE 300 MG PO TABS
ORAL_TABLET | ORAL | Status: AC
  Filled 2018-01-19: qty 2

## 2018-01-19 MED ORDER — OXCARBAZEPINE 300 MG PO TABS
600.0000 mg | ORAL_TABLET | Freq: Every day | ORAL | Status: DC
  Administered 2018-01-19: 600 mg via ORAL
  Filled 2018-01-19 (×4): qty 2

## 2018-01-19 MED ORDER — MIRTAZAPINE 30 MG PO TABS
ORAL_TABLET | ORAL | Status: AC
  Filled 2018-01-19: qty 1

## 2018-01-19 MED ORDER — LEVETIRACETAM 500 MG PO TABS
500.0000 mg | ORAL_TABLET | Freq: Two times a day (BID) | ORAL | Status: DC
  Administered 2018-01-19 – 2018-01-20 (×2): 500 mg via ORAL
  Filled 2018-01-19 (×2): qty 1

## 2018-01-19 MED ORDER — OXCARBAZEPINE 300 MG PO TABS
300.0000 mg | ORAL_TABLET | Freq: Every day | ORAL | Status: DC
  Administered 2018-01-20: 300 mg via ORAL
  Filled 2018-01-19 (×4): qty 1

## 2018-01-19 MED ORDER — GABAPENTIN 300 MG PO CAPS
600.0000 mg | ORAL_CAPSULE | Freq: Three times a day (TID) | ORAL | Status: DC
  Administered 2018-01-19 – 2018-01-20 (×2): 600 mg via ORAL
  Filled 2018-01-19 (×2): qty 2

## 2018-01-19 MED ORDER — IBUPROFEN 800 MG PO TABS
800.0000 mg | ORAL_TABLET | Freq: Three times a day (TID) | ORAL | Status: DC
  Administered 2018-01-19 – 2018-01-20 (×2): 800 mg via ORAL
  Filled 2018-01-19 (×2): qty 1

## 2018-01-19 MED ORDER — ALBUTEROL SULFATE (2.5 MG/3ML) 0.083% IN NEBU: 3.0000 mL | INHALATION_SOLUTION | Freq: Four times a day (QID) | RESPIRATORY_TRACT | Status: DC | PRN

## 2018-01-19 MED ORDER — IOPAMIDOL (ISOVUE-300) INJECTION 61%
100.0000 mL | Freq: Once | INTRAVENOUS | Status: AC | PRN
  Administered 2018-01-19: 100 mL via INTRAVENOUS

## 2018-01-19 MED ORDER — ASPIRIN 325 MG PO TABS
325.0000 mg | ORAL_TABLET | Freq: Every day | ORAL | Status: DC
  Administered 2018-01-20: 325 mg via ORAL
  Filled 2018-01-19: qty 1
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
## 2018-01-19 NOTE — Telephone Encounter (Signed)
Noted, no further recommendations. 

## 2018-01-19 NOTE — Progress Notes (Signed)
Patient reporting chest pain that radiates to the left back. Denies shortness of breath. Vital signs stable. Patient reported that she has not urinated since yesterday evening. Bladder scan performed. Highest reading 93 mls. MD notified.

## 2018-01-19 NOTE — ED Triage Notes (Signed)
Pt states she was sent over by Dr. Oneida Alar for calcium level of 14. Pt also reports central CP and SOB x 2-3 days.
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
## 2018-01-19 NOTE — Telephone Encounter (Signed)
T/C from [facility] with critical result on calcium of 14.6 . Forwarding to Walden Field, NP who is doing hospital call today, in the absence of Roseanne Kaufman, NP.

## 2018-01-19 NOTE — Telephone Encounter (Signed)
PT is aware and will go to [facility] now.

## 2018-01-19 NOTE — Telephone Encounter (Signed)
Please call the patient. She needs to go to the ER given the level of elevated Calcium. Recommendations are for IV fluids and other medications that we cannot do here.

## 2018-01-20 DIAGNOSIS — D72829 Elevated white blood cell count, unspecified: Secondary | ICD-10-CM

## 2018-01-20 DIAGNOSIS — D75839 Thrombocytosis, unspecified: Secondary | ICD-10-CM

## 2018-01-20 DIAGNOSIS — M549 Dorsalgia, unspecified: Secondary | ICD-10-CM | POA: Diagnosis not present

## 2018-01-20 DIAGNOSIS — R109 Unspecified abdominal pain: Secondary | ICD-10-CM

## 2018-01-20 DIAGNOSIS — E871 Hypo-osmolality and hyponatremia: Secondary | ICD-10-CM | POA: Diagnosis not present

## 2018-01-20 DIAGNOSIS — D473 Essential (hemorrhagic) thrombocythemia: Secondary | ICD-10-CM

## 2018-01-20 DIAGNOSIS — K5900 Constipation, unspecified: Secondary | ICD-10-CM

## 2018-01-20 DIAGNOSIS — G40909 Epilepsy, unspecified, not intractable, without status epilepticus: Secondary | ICD-10-CM

## 2018-01-20 DIAGNOSIS — G8929 Other chronic pain: Secondary | ICD-10-CM

## 2018-01-20 DIAGNOSIS — K219 Gastro-esophageal reflux disease without esophagitis: Secondary | ICD-10-CM

## 2018-01-20 DIAGNOSIS — F418 Other specified anxiety disorders: Secondary | ICD-10-CM

## 2018-01-20 LAB — CBC WITH DIFFERENTIAL/PLATELET
BASOS ABS: 97 {cells}/uL (ref 0–200)
Basophils Relative: 0.8 %
EOS ABS: 157 {cells}/uL (ref 15–500)
Eosinophils Relative: 1.3 %
HEMATOCRIT: 37.7 % (ref 35.0–45.0)
HEMOGLOBIN: 12.9 g/dL (ref 11.7–15.5)
Lymphs Abs: 2880 cells/uL (ref 850–3900)
MCH: 29.5 pg (ref 27.0–33.0)
MCHC: 34.2 g/dL (ref 32.0–36.0)
MCV: 86.3 fL (ref 80.0–100.0)
MPV: 10.3 fL (ref 7.5–12.5)
Monocytes Relative: 8.3 %
NEUTROS ABS: 7962 {cells}/uL — AB (ref 1500–7800)
Neutrophils Relative %: 65.8 %
Platelets: 544 10*3/uL — ABNORMAL HIGH (ref 140–400)
RBC: 4.37 10*6/uL (ref 3.80–5.10)
RDW: 14 % (ref 11.0–15.0)
Total Lymphocyte: 23.8 %
WBC: 12.1 10*3/uL — ABNORMAL HIGH (ref 3.8–10.8)
WBCMIX: 1004 {cells}/uL — AB (ref 200–950)

## 2018-01-20 LAB — CBC
HEMATOCRIT: 31.4 % — AB (ref 36.0–46.0)
HEMOGLOBIN: 10.4 g/dL — AB (ref 12.0–15.0)
MCH: 29.9 pg (ref 26.0–34.0)
MCHC: 33.1 g/dL (ref 30.0–36.0)
MCV: 90.2 fL (ref 78.0–100.0)
Platelets: 411 10*3/uL — ABNORMAL HIGH (ref 150–400)
RBC: 3.48 MIL/uL — AB (ref 3.87–5.11)
RDW: 15.1 % (ref 11.5–15.5)
WBC: 10.6 10*3/uL — ABNORMAL HIGH (ref 4.0–10.5)

## 2018-01-20 LAB — BASIC METABOLIC PANEL
ANION GAP: 7 (ref 5–15)
BUN: 6 mg/dL (ref 6–20)
CO2: 26 mmol/L (ref 22–32)
Calcium: 10 mg/dL (ref 8.9–10.3)
Chloride: 94 mmol/L — ABNORMAL LOW (ref 98–111)
Creatinine, Ser: 0.7 mg/dL (ref 0.44–1.00)
GFR calc non Af Amer: >60 mL/min (ref >60)
GLUCOSE: 89 mg/dL (ref 70–99)
POTASSIUM: 4 mmol/L (ref 3.5–5.1)
Sodium: 127 mmol/L — ABNORMAL LOW (ref 135–145)

## 2018-01-20 LAB — HEPATITIS B SURFACE ANTIBODY,QUALITATIVE: Hep B S Ab: NONREACTIVE

## 2018-01-20 LAB — PARATHYROID HORMONE, INTACT (NO CA): PTH: 6 pg/mL — AB (ref 15–65)

## 2018-01-20 LAB — COMPLETE METABOLIC PANEL WITH GFR
AG Ratio: 1.6 (calc) (ref 1.0–2.5)
ALBUMIN MSPROF: 4.2 g/dL (ref 3.6–5.1)
ALT: 9 U/L (ref 6–29)
AST: 12 U/L (ref 10–30)
Alkaline phosphatase (APISO): 89 U/L (ref 33–115)
BILIRUBIN TOTAL: 0.3 mg/dL (ref 0.2–1.2)
BUN: 8 mg/dL (ref 7–25)
CHLORIDE: 93 mmol/L — AB (ref 98–110)
CO2: 33 mmol/L — ABNORMAL HIGH (ref 20–32)
Calcium: 14.6 mg/dL (ref 8.6–10.2)
Creat: 0.8 mg/dL (ref 0.50–1.10)
GFR, Est African American: 104 mL/min/{1.73_m2} (ref >=60)
GFR, Est Non African American: 90 mL/min/{1.73_m2} (ref >=60)
GLOBULIN: 2.6 g/dL (ref 1.9–3.7)
Glucose, Bld: 95 mg/dL (ref 65–99)
POTASSIUM: 4.7 mmol/L (ref 3.5–5.3)
Sodium: 131 mmol/L — ABNORMAL LOW (ref 135–146)
TOTAL PROTEIN: 6.8 g/dL (ref 6.1–8.1)

## 2018-01-20 LAB — HEPATITIS C ANTIBODY
Hepatitis C Ab: NONREACTIVE
SIGNAL TO CUT-OFF: 0.01 (ref <1.00)

## 2018-01-20 LAB — HEPATITIS B SURFACE ANTIGEN: Hepatitis B Surface Ag: NONREACTIVE

## 2018-01-20 LAB — ALBUMIN: Albumin: 3.1 g/dL — ABNORMAL LOW (ref 3.5–5.0)

## 2018-01-20 LAB — LIPID PANEL
CHOL/HDL RATIO: 8.2 ratio
Cholesterol: 271 mg/dL — ABNORMAL HIGH (ref 0–200)
HDL: 33 mg/dL — AB (ref >40)
LDL Cholesterol: 174 mg/dL — ABNORMAL HIGH (ref 0–99)
Triglycerides: 322 mg/dL — ABNORMAL HIGH (ref <150)
VLDL: 64 mg/dL — AB (ref 0–40)

## 2018-01-20 LAB — HEPATITIS A ANTIBODY, TOTAL: Hepatitis A AB,Total: NONREACTIVE

## 2018-01-20 LAB — TSH: TSH: 1.888 u[IU]/mL (ref 0.350–4.500)

## 2018-01-20 LAB — HEPATITIS B CORE ANTIBODY, TOTAL: HEP B C TOTAL AB: NONREACTIVE

## 2018-01-20 MED ORDER — ALUM & MAG HYDROXIDE-SIMETH 200-200-20 MG/5ML PO SUSP
30.0000 mL | ORAL | Status: DC | PRN
  Administered 2018-01-20: 30 mL via ORAL
  Filled 2018-01-20: qty 30

## 2018-01-20 MED ORDER — NICOTINE 21 MG/24HR TD PT24
21.0000 mg | MEDICATED_PATCH | Freq: Every day | TRANSDERMAL | Status: DC
  Administered 2018-01-20: 21 mg via TRANSDERMAL
  Filled 2018-01-20: qty 1

## 2018-01-20 MED ORDER — BUTALBITAL-APAP-CAFFEINE 50-325-40 MG PO TABS
1.0000 | ORAL_TABLET | Freq: Four times a day (QID) | ORAL | Status: DC | PRN
  Administered 2018-01-20: 2 via ORAL
  Filled 2018-01-20: qty 2
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
## 2018-01-20 NOTE — Discharge Instructions (Signed)
Calcium Intake Recommendations Calcium is a mineral that affects many functions in the body, including:  Blood clotting.  Blood vessel function.  Nerve impulse conduction.  Hormone secretion.  Muscle contraction.  Bone and teeth functions.  Most of your body's calcium supply is stored in your bones and teeth. When your calcium stores are low, you may be at risk for low bone mass, bone loss, and bone fractures. Consuming enough calcium helps to grow healthy bones and teeth and to prevent breakdown over time. It is very important that you get enough calcium if you are:  A child undergoing rapid growth.  An adolescent girl.  A pre- or post-menopausal woman.  A woman whose menstrual cycle has stopped due to anorexia nervosa or regular intense exercise.  An individual with lactose intolerance or a milk allergy.  A vegetarian.  What is my plan? Try to consume the recommended amount of calcium daily based on your age. Depending on your overall health, your health care provider may recommend increased calcium intake.General daily calcium intake recommendations by age are:  Birth to 6 months: 200 mg.  Infants 7 to 12 months: 260 mg.  Children 1 to 3 years: 700 mg.  Children 4 to 8 years: 1,000 mg.  Children 9 to 13 years: 1,300 mg.  Teens 14 to 18 years: 1,300 mg.  Adults 19 to 50 years: 1,000 mg.  Adult women 51 to 70 years: 1,200 mg.  Adult men 51 to 70 years: 1,000 mg.  Adults 71 years and older: 1,200 mg.  Pregnant and breastfeeding teens: 1,300 mg.  Pregnant and breastfeeding adults: 1,000 mg.  What do I need to know about calcium intake?  In order for the body to absorb calcium, it needs vitamin D. You can get vitamin D through: ? Direct exposure of the skin to sunlight. ? Foods, such as egg yolks, liver, saltwater fish, and fortified milk. ? Supplements.  Consuming too much calcium may cause: ? Constipation. ? Decreased absorption of iron and  zinc. ? Kidney stones.  Calcium supplements may interact with certain medicines. Check with your health care provider before starting any calcium supplements.  Try to get most of your calcium from food. What foods can I eat? Grains  Fortified oatmeal. Fortified ready-to-eat cereals. Fortified frozen waffles. Vegetables Turnip greens. Broccoli. Fruits Fortified orange juice. Meats and Other Protein Sources Canned sardines with bones. Canned salmon with bones. Soy beans. Tofu. Baked beans. Almonds. [place] nuts. Sunflower seeds. Dairy Milk. Yogurt. Cheese. Cottage cheese. Beverages Fortified soy milk. Fortified rice milk. Sweets/Desserts Pudding. Ice Cream. Milkshakes. Blackstrap molasses. The items listed above may not be a complete list of recommended foods or beverages. Contact your dietitian for more options. What foods can affect my calcium intake? It may be more difficult for your body to use calcium or calcium may leave your body more quickly if you consume large amounts of:  Sodium.  Protein.  Caffeine.  Alcohol.  This information is not intended to replace advice given to you by your health care provider. Make sure you discuss any questions you have with your health care provider. Document Released: 11/18/2003 Document Revised: 10/24/2015 Document Reviewed: 09/11/2013 Elsevier Interactive Patient Education  2018 [employer]. Hypercalcemia Hypercalcemia is having too much calcium in the blood. The body needs calcium to make bones and keep them strong. Calcium also helps the muscles, nerves, brain, and heart work the way they should. Most of the calcium in the body is in the bones. There is  also some calcium in the blood. Hypercalcemia can happen when calcium comes out of the bones, or when the kidneys are not able to remove calcium from the blood. Hypercalcemia can be mild or severe. What are the causes? There are many possible causes of hypercalcemia. Common causes  include:  Hyperparathyroidism. This is a condition in which the body produces too much parathyroid hormone. There are four parathyroid glands in your neck. These glands produce a chemical messenger (hormone) that helps the body absorb calcium from foods and helps your bones release calcium.  Certain kinds of cancer, such as lung cancer, breast cancer, or myeloma.  Less common causes of hypercalcemia include:  Getting too much calcium or vitamin D from your diet.  Kidney failure.  Hyperthyroidism.  Being on bed rest for a long time.  Certain medicines.  Infections.  Sarcoidosis.  What increases the risk? This condition is more likely to develop in:  Women.  People who are 60 years or older.  People who have a family history of hypercalcemia.  What are the signs or symptoms? Mild hypercalcemia that starts slowly may not cause symptoms. Severe, sudden hypercalcemia is more likely to cause symptoms, such as:  Loss of appetite.  Increased thirst and frequent urination.  Fatigue.  Nausea and vomiting.  Headache.  Abdominal pain.  Muscle pain, twitching, or weakness.  Constipation.  Blood in the urine.  Pain in the side of the back (flank pain).  Anxiety, confusion, or depression.  Irregular heartbeat (arrhythmia).  Loss of consciousness.  How is this diagnosed? This condition may be diagnosed based on:  Your symptoms.  Blood tests.  Urine tests.  X-rays.  Ultrasound.  MRI.  CT scan.  How is this treated? Treatment for hypercalcemia depends on the cause. Treatment may include:  Receiving fluids through an IV tube.  Medicines that keep calcium levels steady after receiving fluids (loop diuretics).  Medicines that keep calcium in your bones (bisphosphonates).  Medicines that lower the calcium level in your blood.  Surgery to remove overactive parathyroid glands.  Follow these instructions at home:  Take over-the-counter and  prescription medicines only as told by your health care provider.  Follow instructions from your health care provider about eating or drinking restrictions.  Drink enough fluid to keep your urine clear or pale yellow.  Stay active. Weight-bearing exercise helps to keep calcium in your bones. Follow instructions from your health care provider about what type and level of exercise is safe for you.  Keep all follow-up visits as told by your health care provider. This is important. Contact a health care provider if:  You have a fever.  You have flank or abdominal pain that is getting worse. Get help right away if:  You have severe abdominal or flank pain.  You have chest pain.  You have trouble breathing.  You become very confused and sleepy.  You lose consciousness. This information is not intended to replace advice given to you by your health care provider. Make sure you discuss any questions you have with your health care provider. Document Released: 06/19/2004 Document Revised: 09/11/2015 Document Reviewed: 08/21/2014 Elsevier Interactive Patient Education  2018 [employer].

## 2018-01-20 NOTE — Progress Notes (Signed)
Pt discharged home today per Dr. Ree Kida. Pt's IV site D/C'd and WDL. Pt's VSS. Pt provided with home medication list, discharge instructions and prescriptions. Verbalized understanding. Pt left floor via WC in stable condition accompanied by NT.

## 2018-01-20 NOTE — Telephone Encounter (Signed)
No acute findings on CT. Save for AB review and recommendations.

## 2018-01-20 NOTE — Telephone Encounter (Signed)
Lauren Cross switched her from pantoprazole to dexilant this week.

## 2018-01-21 LAB — HIV ANTIBODY (ROUTINE TESTING W REFLEX): HIV SCREEN 4TH GENERATION: NONREACTIVE

## 2018-01-21 LAB — CALCIUM, IONIZED: Calcium, Ionized, Serum: 5.8 mg/dL — ABNORMAL HIGH (ref 4.5–5.6)

## 2018-01-24 NOTE — Telephone Encounter (Signed)
Noted.  See result note.  

## 2018-01-24 NOTE — Progress Notes (Signed)
Seen by Roseanne Kaufman, NP

## 2018-01-25 NOTE — Progress Notes (Signed)
Hep B and C negative. CT with possible findings of cirrhosis; however, likely severe fatty liver. Platelets are normal. We could pursue elastography at next outpatient visit. Recommend colonoscopy/EGD with Dr. Oneida Alar WITH PROPOFOL. These labs were addressed as inpatient due to severe hypercalcemia. It would be best to have her come back to office prior to TCS/EGD but let's at least save a slot and update H&P to tie up loose ends.

## 2018-01-30 NOTE — Telephone Encounter (Signed)
Dexilant 60 mg #15 at front to take one capsule daily. Pt is aware.

## 2018-02-06 DIAGNOSIS — R569 Unspecified convulsions: Secondary | ICD-10-CM | POA: Insufficient documentation

## 2018-02-07 ENCOUNTER — Telehealth: Payer: Self-pay | Admitting: Internal Medicine

## 2018-02-07 DIAGNOSIS — K760 Fatty (change of) liver, not elsewhere classified: Secondary | ICD-10-CM

## 2018-02-07 NOTE — Telephone Encounter (Signed)
Pt saw AB on 10/1 and has FU OV with AB on 12/17. She is wanting to be seen sooner because of her diarrhea. There is nothing available at the moment and she is on a wait list. Please advise and call her at [phone]

## 2018-02-09 ENCOUNTER — Telehealth: Payer: Self-pay

## 2018-02-09 NOTE — Telephone Encounter (Signed)
I called [employer] and spoke with Joellen Jersey [phone]). Received approval for the Dexilant 60 mg daily for one year. PA# [id number]. Good 02/09/2018 through 02/04/2019.  Faxing info to pharmacy.

## 2018-02-09 NOTE — Telephone Encounter (Signed)
She may be referring to ultrasound elastography due to fatty liver, concern for fibrosis possibly early cirrhosis.   [facility] clinical pool: we can schedule [place] complete elastography.  If there is an urgent, she can be put in there. Otherwise, keep appt Dec 2019 and remain on cancellation list.

## 2018-02-09 NOTE — Telephone Encounter (Addendum)
U/S scheduled for 02/15/18 at 9:30am, arrival time 9:15am, NPO after midnight.  lmovm for pt  PA for U/S was approved via [employer] website Auth# [id number] dates 02/09/18-03/11/18

## 2018-02-09 NOTE — Telephone Encounter (Signed)
Spoke with pt. Her diarrhea d/c after she stopped the Linzess medication. Linzess was causing diarrhea. Even though pt isn't having diarrhea any more, pt states that she still wants to be seen before 03/2018 and and wants to schedule test needed for kidney? I'm not sure what procedure pt is referring to.   Please advise

## 2018-02-09 NOTE — Telephone Encounter (Signed)
Noted  

## 2018-02-10 NOTE — Telephone Encounter (Signed)
Patient is aware of appt details.

## 2018-02-15 ENCOUNTER — Ambulatory Visit (HOSPITAL_COMMUNITY): Admission: RE | Admit: 2018-02-15 | Payer: Medicaid Other | Source: Ambulatory Visit

## 2018-02-27 ENCOUNTER — Ambulatory Visit (HOSPITAL_COMMUNITY): Admission: RE | Admit: 2018-02-27 | Payer: Medicaid Other | Source: Ambulatory Visit

## 2018-03-23 ENCOUNTER — Telehealth: Payer: Self-pay | Admitting: *Deleted

## 2018-03-23 NOTE — Telephone Encounter (Addendum)
Spoke with patient and made aware SLF is no longer in the or on 04/25/18. She was agreeable to having [facility] perform the procedure. Patient is having to come back in for updated OV w/ AB on 04/04/18. Orders/instructions will be given then.

## 2018-03-31 ENCOUNTER — Other Ambulatory Visit: Payer: Self-pay | Admitting: Nurse Practitioner

## 2018-04-03 ENCOUNTER — Telehealth: Payer: Self-pay | Admitting: Gastroenterology

## 2018-04-03 NOTE — Telephone Encounter (Signed)
Pt has questions about her dexilant. She said she was unable to take it due to her having epilepsy. [phone] Pt is aware that she has OV tomorrow.

## 2018-04-03 NOTE — Telephone Encounter (Signed)
PT has an appt with Roseanne Kaufman, NP and I told her to discuss with her at the visit tomorrow.

## 2018-04-04 ENCOUNTER — Ambulatory Visit: Payer: Medicaid Other | Admitting: Gastroenterology

## 2018-04-04 ENCOUNTER — Encounter: Payer: Self-pay | Admitting: Gastroenterology

## 2018-04-04 VITALS — BP 115/81 | HR 91 | Temp 98.1°F | Ht 65.0 in | Wt 141.6 lb

## 2018-04-04 DIAGNOSIS — K219 Gastro-esophageal reflux disease without esophagitis: Secondary | ICD-10-CM | POA: Diagnosis not present

## 2018-04-04 DIAGNOSIS — K625 Hemorrhage of anus and rectum: Secondary | ICD-10-CM | POA: Diagnosis not present

## 2018-04-04 DIAGNOSIS — Z86010 Personal history of colonic polyps: Secondary | ICD-10-CM

## 2018-04-04 DIAGNOSIS — R1012 Left upper quadrant pain: Secondary | ICD-10-CM | POA: Diagnosis not present

## 2018-04-04 DIAGNOSIS — Z860101 Personal history of adenomatous and serrated colon polyps: Secondary | ICD-10-CM

## 2018-04-04 DIAGNOSIS — K5900 Constipation, unspecified: Secondary | ICD-10-CM

## 2018-04-04 MED ORDER — PANTOPRAZOLE SODIUM 40 MG PO TBEC: 40.0000 mg | DELAYED_RELEASE_TABLET | Freq: Two times a day (BID) | ORAL | 3 refills | Status: DC

## 2018-04-04 NOTE — Patient Instructions (Addendum)
Colonoscopy/EGD with Dr. Oneida Alar in the future.We definitely need clearance from the neurologist prior to pursuing this.  For constipation: Take Amitiza twice a day WITH FOOD to avoid nausea. Let us know how this works for you, as we can adjust the dosage if needed.  For reflux: increase Protonix to twice a day, 30 minutes before breakfast and dinner.  You can use vasoline on the area above the bottom. You can use preparation H wipes or Tucks pads for your rectum.  We will see you in 3 months regardless!  I enjoyed seeing you again today! As you know, I value our relationship and want to provide genuine, compassionate, and quality care. I welcome your feedback. If you receive a survey regarding your visit,  I greatly appreciate you taking time to fill this out. See you next time!  Annitta Needs, PhD, ANP-BC [facility]
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
## 2018-04-06 ENCOUNTER — Telehealth: Payer: Self-pay

## 2018-04-06 NOTE — Telephone Encounter (Addendum)
PT's mother Naomee Nowland) called to ask about side effects of Amitiza. She said pt was started on the Amitiza bid when she saw Roseanne Kaufman, NP on 04/04/2018. Since pt has had headaches and diarrhea. I told her the diarrhea is common until she adjusts to it. She said the headaches have made her feel like she might have a seizure. I told her not to let pt take anymore until I speak with provider. Her call back number is [phone].  Forwarding to Walden Field, NP in Anna's absence this afternoon to advise! Also, RMR pt, sending FYI to [place].

## 2018-04-06 NOTE — Telephone Encounter (Signed)
I spoke with Walden Field, NP and he said headaches are a common side effect of the amitiza. Dizziness and fatigue are less common side effects. Seizures are not listed as side effect. However, if pt feels the way she does before she has a seizure, DO NOT TAKE ANY MORE. When Vicente Males is back in the office she will make recommendations. Pt's mom is aware.

## 2018-04-07 NOTE — Telephone Encounter (Signed)
Pt notified of AB recommendations.

## 2018-04-07 NOTE — Telephone Encounter (Signed)
I doubt this is a side effect she is having. However, to be on the safe side, she can take Miralax daily to BID as needed. Progress report Monday.

## 2018-04-10 NOTE — Telephone Encounter (Signed)
I spoke to pt today, she said she is feeling better. She took Senocot instead of the Miralax. She said she took 2 tablets first and then one tablet daily x 2 days. She is doing much better. I told her again about the miralax she could have daily or bid. She said that it use to give her diarrhea. I told her she could just take as needed. She will try and let us know if she has more problems.

## 2018-04-11 ENCOUNTER — Other Ambulatory Visit: Payer: Self-pay | Admitting: *Deleted

## 2018-04-11 ENCOUNTER — Telehealth: Payer: Self-pay

## 2018-04-11 DIAGNOSIS — K625 Hemorrhage of anus and rectum: Secondary | ICD-10-CM

## 2018-04-11 DIAGNOSIS — R1012 Left upper quadrant pain: Secondary | ICD-10-CM

## 2018-04-11 MED ORDER — CLENPIQ 10-3.5-12 MG-GM -GM/160ML PO SOLN: 1.0000 | Freq: Once | ORAL | 0 refills | Status: AC

## 2018-04-11 NOTE — Assessment & Plan Note (Addendum)
42 year old female with persistent abdominal pain, LUQ pain, with negative CT other than possible findings of early cirrhosis but more likely fatty liver. Platelets normal. Elastography ordered in future. Admits to Ibuprofen daily, constant nausea, decreased appetite. Discussed EGD in near future. ALTHOUGH she notes seizures, this has not been documented while at [facility], and I have received neuro clearance as of 04/08/18 from Dr. Lisabeth Devoid, requesting she take seizure medications with sips of med the morning of the procedure.   Proceed with EGD with Dr. Oneida Alar in the near future. The risks, benefits, and alternatives have been discussed in detail with the patient. They state understanding and desire to proceed.  Propofol due to polypharmacy [place] elastography

## 2018-04-11 NOTE — Assessment & Plan Note (Signed)
Intermittent in setting of constipation. Likely benign anorectal source. Colonoscopy for polyp surveillance.

## 2018-04-11 NOTE — Telephone Encounter (Signed)
Dr. Manuella Ghazi,   This mutual patient was seen in our office on 04/04/2018 by Roseanne Kaufman, NP.  She is tentatively scheduled for a colonoscopy and upper endoscopy with Dr. Gala Romney on 04/25/2018.  Dr. Gala Romney and Lauren Cross are requesting a Neurology clearance from you for the procedures.  Dr. Manuella Ghazi is unavailable and the note said Lauren Cross is receiving messages.    Please advise!  Thanks so much!  Lauren All, LPN

## 2018-04-11 NOTE — Progress Notes (Signed)
cc'd to pcp 

## 2018-04-11 NOTE — Assessment & Plan Note (Addendum)
History of advanced adenoma in 2015 at outside facility and overdue for surveillance. Needs surveillance colonoscopy in the near future. Neurology clearance received and to be scanned in.   Proceed with colonoscopy with Dr. Oneida Alar in the near future. The risks, benefits, and alternatives have been discussed in detail with the patient. They state understanding and desire to proceed.  Propofol due to polypharmacy

## 2018-04-11 NOTE — Telephone Encounter (Addendum)
Received clearance from Neuro. AB has letter. After further discussing with AB we are going to place on [facility] schedule at next available for TCS/EGD W/ MAC w/ [facility] to get patients constipation better under control.  Called pt LMTCB to schedule

## 2018-04-11 NOTE — Assessment & Plan Note (Signed)
Dexilant samples provided previously but not helpful. Increase Protonix to BID.

## 2018-04-11 NOTE — Addendum Note (Signed)
Addended by: Inge Rise on: 04/11/2018 12:56 PM   Modules accepted: Orders

## 2018-04-11 NOTE — Telephone Encounter (Signed)
Spoke with patient and she was agreeable to postponing procedure. She is scheduled for 07/11/18 at 10:45am. Patient aware will let her know when pre-op will be. I also will mail instructions for prep to her. She voiced understanding.

## 2018-04-11 NOTE — Assessment & Plan Note (Signed)
Linzess caused diarrhea. Amitiza 24 mcg samples provided. Call with progress report.

## 2018-04-18 NOTE — Telephone Encounter (Signed)
Pre-op scheduled for 3/17 at 10:00am. Letter mailed. LMTCB

## 2018-04-28 ENCOUNTER — Other Ambulatory Visit: Payer: Self-pay | Admitting: Physician Assistant

## 2018-04-28 ENCOUNTER — Other Ambulatory Visit (HOSPITAL_COMMUNITY): Payer: Self-pay | Admitting: Physician Assistant

## 2018-04-28 DIAGNOSIS — M5416 Radiculopathy, lumbar region: Secondary | ICD-10-CM

## 2018-05-03 ENCOUNTER — Ambulatory Visit (HOSPITAL_COMMUNITY): Payer: Medicaid Other

## 2018-06-21 ENCOUNTER — Telehealth: Payer: Self-pay | Admitting: Gastroenterology

## 2018-06-21 NOTE — Telephone Encounter (Signed)
Called and informed endo scheduler to cancel procedure.  FYI to AB.

## 2018-06-21 NOTE — Telephone Encounter (Signed)
Pt said she had a seizure and broke a vertebrae in her neck and is having to wear a neck brace. She wants to cancel pre op and procedure to have time to heal. She wants to keep her OV for 3/18 to get rescheduled.

## 2018-06-21 NOTE — Telephone Encounter (Signed)
Noted  

## 2018-06-28 ENCOUNTER — Other Ambulatory Visit: Payer: Self-pay | Admitting: Gastroenterology

## 2018-07-04 ENCOUNTER — Other Ambulatory Visit (HOSPITAL_COMMUNITY): Payer: Medicaid Other

## 2018-07-05 ENCOUNTER — Ambulatory Visit: Payer: Medicaid Other | Admitting: Gastroenterology

## 2018-07-11 ENCOUNTER — Encounter (HOSPITAL_COMMUNITY): Payer: Self-pay

## 2018-07-11 ENCOUNTER — Ambulatory Visit (HOSPITAL_COMMUNITY): Admit: 2018-07-11 | Payer: Medicaid Other | Admitting: Gastroenterology

## 2018-07-11 SURGERY — COLONOSCOPY WITH PROPOFOL
Anesthesia: Monitor Anesthesia Care

## 2018-08-04 LAB — LIPID PANEL
Cholesterol: 332 — AB (ref 0–200)
HDL: 34 — AB (ref 35–70)
LDL Cholesterol: 234
Triglycerides: 378 — AB (ref 40–160)

## 2018-08-04 LAB — HEMOGLOBIN A1C: Hemoglobin A1C: 6

## 2018-08-04 LAB — BASIC METABOLIC PANEL
BUN: 9 (ref 4–21)
Creatinine: 1 (ref 0.5–1.1)

## 2018-08-04 LAB — VITAMIN D 25 HYDROXY (VIT D DEFICIENCY, FRACTURES): Vit D, 25-Hydroxy: 15

## 2018-09-19 ENCOUNTER — Ambulatory Visit (INDEPENDENT_AMBULATORY_CARE_PROVIDER_SITE_OTHER): Payer: Medicaid Other | Admitting: "Endocrinology

## 2018-09-19 ENCOUNTER — Encounter: Payer: Self-pay | Admitting: "Endocrinology

## 2018-09-19 ENCOUNTER — Other Ambulatory Visit: Payer: Self-pay

## 2018-09-19 VITALS — BP 110/80 | HR 91 | Temp 98.2°F | Ht 64.5 in | Wt 149.8 lb

## 2018-09-19 DIAGNOSIS — R7303 Prediabetes: Secondary | ICD-10-CM | POA: Insufficient documentation

## 2018-09-19 DIAGNOSIS — E782 Mixed hyperlipidemia: Secondary | ICD-10-CM | POA: Insufficient documentation

## 2018-09-19 DIAGNOSIS — E162 Hypoglycemia, unspecified: Secondary | ICD-10-CM | POA: Diagnosis not present

## 2018-09-19 NOTE — Patient Instructions (Signed)
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
## 2018-11-14 ENCOUNTER — Telehealth: Payer: Self-pay | Admitting: Nutrition

## 2018-11-14 ENCOUNTER — Ambulatory Visit: Payer: Medicaid Other | Admitting: Nutrition

## 2018-11-14 NOTE — Telephone Encounter (Signed)
Left message with family member to have patient call me to complete phone visit.

## 2018-11-21 ENCOUNTER — Other Ambulatory Visit: Payer: Self-pay

## 2018-11-21 ENCOUNTER — Encounter: Payer: Self-pay | Admitting: "Endocrinology

## 2018-11-21 ENCOUNTER — Ambulatory Visit (INDEPENDENT_AMBULATORY_CARE_PROVIDER_SITE_OTHER): Payer: Medicaid Other | Admitting: "Endocrinology

## 2018-11-21 ENCOUNTER — Encounter (INDEPENDENT_AMBULATORY_CARE_PROVIDER_SITE_OTHER): Payer: Self-pay

## 2018-11-21 VITALS — BP 93/60 | HR 92 | Temp 98.2°F | Ht 64.5 in | Wt 146.8 lb

## 2018-11-21 DIAGNOSIS — E782 Mixed hyperlipidemia: Secondary | ICD-10-CM

## 2018-11-21 DIAGNOSIS — R7303 Prediabetes: Secondary | ICD-10-CM

## 2018-11-21 DIAGNOSIS — F17200 Nicotine dependence, unspecified, uncomplicated: Secondary | ICD-10-CM | POA: Diagnosis not present

## 2018-11-21 MED ORDER — ROSUVASTATIN CALCIUM 5 MG PO TABS: 5.0000 mg | ORAL_TABLET | Freq: Every day | ORAL | 1 refills | Status: DC
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
## 2018-11-21 NOTE — Patient Instructions (Signed)

## 2018-12-29 ENCOUNTER — Other Ambulatory Visit: Payer: Self-pay | Admitting: Nurse Practitioner

## 2019-04-19 ENCOUNTER — Other Ambulatory Visit: Payer: Self-pay | Admitting: Gastroenterology

## 2019-05-28 ENCOUNTER — Ambulatory Visit: Payer: Medicaid Other | Admitting: "Endocrinology

## 2019-07-17 ENCOUNTER — Ambulatory Visit (INDEPENDENT_AMBULATORY_CARE_PROVIDER_SITE_OTHER): Payer: Medicaid Other | Admitting: Orthopaedic Surgery

## 2019-07-17 ENCOUNTER — Other Ambulatory Visit: Payer: Self-pay

## 2019-07-17 ENCOUNTER — Ambulatory Visit (INDEPENDENT_AMBULATORY_CARE_PROVIDER_SITE_OTHER): Payer: Medicaid Other

## 2019-07-17 ENCOUNTER — Encounter: Payer: Self-pay | Admitting: Orthopaedic Surgery

## 2019-07-17 VITALS — BP 93/60 | HR 72 | Ht 65.0 in | Wt 155.0 lb

## 2019-07-17 DIAGNOSIS — M25572 Pain in left ankle and joints of left foot: Secondary | ICD-10-CM | POA: Diagnosis not present
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
## 2019-07-18 ENCOUNTER — Encounter (HOSPITAL_COMMUNITY): Payer: Self-pay | Admitting: Orthopedic Surgery

## 2019-07-18 ENCOUNTER — Ambulatory Visit: Payer: Medicaid Other | Admitting: Orthopaedic Surgery

## 2019-07-18 ENCOUNTER — Other Ambulatory Visit: Payer: Self-pay

## 2019-07-18 NOTE — Anesthesia Preprocedure Evaluation (Addendum)
Anesthesia Evaluation  Patient identified by MRN, date of birth, ID band Patient awake    Reviewed: Allergy & Precautions, NPO status , Patient's Chart, lab work & pertinent test results  History of Anesthesia Complications (+) PONV, AWARENESS UNDER ANESTHESIA and history of anesthetic complications  Airway Mallampati: II  TM Distance: >3 FB Neck ROM: Full    Dental  (+) Teeth Intact   Pulmonary asthma , former smoker,    Pulmonary exam normal        Cardiovascular hypertension, Normal cardiovascular exam     Neuro/Psych Seizures -,  PSYCHIATRIC DISORDERS Anxiety Depression Bipolar Disorder Bilateral carotid stenosis CVA    GI/Hepatic Neg liver ROS, GERD  ,  Endo/Other  diabetes  Renal/GU negative Renal ROS  negative genitourinary   Musculoskeletal  (+) Fibromyalgia -  Abdominal   Peds  Hematology negative hematology ROS (+)   Anesthesia Other Findings   Reproductive/Obstetrics                           Anesthesia Physical Anesthesia Plan  ASA: III  Anesthesia Plan: MAC and Regional   Post-op Pain Management:    Induction: Intravenous  PONV Risk Score and Plan: 3 and Treatment may vary due to age or medical condition  Airway Management Planned: Natural Airway, Nasal Cannula and Simple Face Mask  Additional Equipment: None  Intra-op Plan:   Post-operative Plan:   Informed Consent: I have reviewed the patients History and Physical, chart, labs and discussed the procedure including the risks, benefits and alternatives for the proposed anesthesia with the patient or authorized representative who has indicated his/her understanding and acceptance.       Plan Discussed with:   Anesthesia Plan Comments: (Pt is a poor historian but has reportedly had recent stroke and unknown cardiac history. We were unable to obtain records, so I discussed with patient that safest course would  be to proceed under regional anesthesia with sedation as tolerated. I explained that this means she may not be fully asleep for the procedure. She understands and agrees to this plan. )      Anesthesia Quick Evaluation

## 2019-07-18 NOTE — Progress Notes (Signed)
Nurse requested anesthesia records from [facility]; awaiting response for all records requested.
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
## 2019-07-18 NOTE — Progress Notes (Signed)
Pt denies any acute cardiopulmonary issues. Pt stated that she is under the care of Dr. Ninfa Meeker, Cardiology and Dr. [name], PCP. Pt denies having a cardiac cath but stated that a stress test was performed in the past an echo was recently performed during her Admission to [facility]. Pt stated that her cardiologist would have all records ( including recent admission ); nurse requested records from [facility] and cardiologist. Nurse also requested LOV note, chest x ray, [place] of neck, MRI of head, CT of Chest, Head and Neck from [facility]; awaiting response. Nurse spoke with Ainsley Spinner, PA to make aware that pt was taking both Plavix and Aspirin; Lanny Hurst advised that pt continue taking both. Pt made aware to stop taking vitamins, fish oil and herbal medications. Do not take any NSAIDs ie: Ibuprofen, Advil, Naproxen (Aleve), Motrin, BC and Goody Powder. Pt reminded to quarantine. Pt vebalized understanding of all pre-op instructions. DR. Nolon Nations, Anesthesiology, made aware that pt has a significant history including anesthesia complications. MD advised that pt be assessed upon arrival.

## 2019-07-19 ENCOUNTER — Inpatient Hospital Stay (HOSPITAL_COMMUNITY): Payer: Medicaid Other

## 2019-07-19 ENCOUNTER — Inpatient Hospital Stay (HOSPITAL_COMMUNITY)
Admission: RE | Admit: 2019-07-19 | Discharge: 2019-07-20 | DRG: 493 | Disposition: A | Discharge status: Home / Self Care | Payer: Medicaid Other | Source: Ambulatory Visit | Attending: Orthopedic Surgery | Admitting: Orthopedic Surgery

## 2019-07-19 ENCOUNTER — Inpatient Hospital Stay (HOSPITAL_COMMUNITY): Payer: Medicaid Other | Admitting: Anesthesiology

## 2019-07-19 ENCOUNTER — Other Ambulatory Visit: Payer: Self-pay

## 2019-07-19 ENCOUNTER — Encounter (HOSPITAL_COMMUNITY): Payer: Self-pay | Admitting: Orthopedic Surgery

## 2019-07-19 ENCOUNTER — Encounter (HOSPITAL_COMMUNITY)
Admission: RE | Disposition: A | Discharge status: Home / Self Care | Payer: Self-pay | Source: Ambulatory Visit | Attending: Orthopedic Surgery

## 2019-07-19 DIAGNOSIS — I252 Old myocardial infarction: Secondary | ICD-10-CM

## 2019-07-19 DIAGNOSIS — W1830XA Fall on same level, unspecified, initial encounter: Secondary | ICD-10-CM | POA: Diagnosis present

## 2019-07-19 DIAGNOSIS — Z841 Family history of disorders of kidney and ureter: Secondary | ICD-10-CM

## 2019-07-19 DIAGNOSIS — Z419 Encounter for procedure for purposes other than remedying health state, unspecified: Secondary | ICD-10-CM

## 2019-07-19 DIAGNOSIS — W228XXA Striking against or struck by other objects, initial encounter: Secondary | ICD-10-CM | POA: Diagnosis present

## 2019-07-19 DIAGNOSIS — M797 Fibromyalgia: Secondary | ICD-10-CM | POA: Diagnosis present

## 2019-07-19 DIAGNOSIS — Z8673 Personal history of transient ischemic attack (TIA), and cerebral infarction without residual deficits: Secondary | ICD-10-CM | POA: Diagnosis not present

## 2019-07-19 DIAGNOSIS — S89302A Unspecified physeal fracture of lower end of left fibula, initial encounter for closed fracture: Secondary | ICD-10-CM | POA: Diagnosis present

## 2019-07-19 DIAGNOSIS — I10 Essential (primary) hypertension: Secondary | ICD-10-CM | POA: Diagnosis present

## 2019-07-19 DIAGNOSIS — G8929 Other chronic pain: Secondary | ICD-10-CM | POA: Diagnosis present

## 2019-07-19 DIAGNOSIS — S0285XA Fracture of orbit, unspecified, initial encounter for closed fracture: Secondary | ICD-10-CM | POA: Diagnosis present

## 2019-07-19 DIAGNOSIS — Z833 Family history of diabetes mellitus: Secondary | ICD-10-CM

## 2019-07-19 DIAGNOSIS — E559 Vitamin D deficiency, unspecified: Secondary | ICD-10-CM | POA: Diagnosis present

## 2019-07-19 DIAGNOSIS — D62 Acute posthemorrhagic anemia: Secondary | ICD-10-CM | POA: Diagnosis present

## 2019-07-19 DIAGNOSIS — Z20822 Contact with and (suspected) exposure to covid-19: Secondary | ICD-10-CM | POA: Diagnosis present

## 2019-07-19 DIAGNOSIS — K219 Gastro-esophageal reflux disease without esophagitis: Secondary | ICD-10-CM | POA: Diagnosis present

## 2019-07-19 DIAGNOSIS — S82832A Other fracture of upper and lower end of left fibula, initial encounter for closed fracture: Secondary | ICD-10-CM | POA: Diagnosis present

## 2019-07-19 DIAGNOSIS — Z7982 Long term (current) use of aspirin: Secondary | ICD-10-CM

## 2019-07-19 DIAGNOSIS — X501XXA Overexertion from prolonged static or awkward postures, initial encounter: Secondary | ICD-10-CM | POA: Diagnosis not present

## 2019-07-19 DIAGNOSIS — G629 Polyneuropathy, unspecified: Secondary | ICD-10-CM | POA: Diagnosis present

## 2019-07-19 DIAGNOSIS — Z79899 Other long term (current) drug therapy: Secondary | ICD-10-CM

## 2019-07-19 DIAGNOSIS — Z8262 Family history of osteoporosis: Secondary | ICD-10-CM

## 2019-07-19 DIAGNOSIS — F329 Major depressive disorder, single episode, unspecified: Secondary | ICD-10-CM | POA: Diagnosis present

## 2019-07-19 DIAGNOSIS — E8889 Other specified metabolic disorders: Secondary | ICD-10-CM | POA: Diagnosis present

## 2019-07-19 DIAGNOSIS — R7303 Prediabetes: Secondary | ICD-10-CM | POA: Diagnosis present

## 2019-07-19 DIAGNOSIS — J45909 Unspecified asthma, uncomplicated: Secondary | ICD-10-CM | POA: Diagnosis present

## 2019-07-19 DIAGNOSIS — Z8619 Personal history of other infectious and parasitic diseases: Secondary | ICD-10-CM | POA: Diagnosis not present

## 2019-07-19 DIAGNOSIS — R569 Unspecified convulsions: Secondary | ICD-10-CM

## 2019-07-19 DIAGNOSIS — Y92009 Unspecified place in unspecified non-institutional (private) residence as the place of occurrence of the external cause: Secondary | ICD-10-CM | POA: Diagnosis not present

## 2019-07-19 DIAGNOSIS — R519 Headache, unspecified: Secondary | ICD-10-CM | POA: Diagnosis present

## 2019-07-19 DIAGNOSIS — Z885 Allergy status to narcotic agent status: Secondary | ICD-10-CM

## 2019-07-19 DIAGNOSIS — Z7902 Long term (current) use of antithrombotics/antiplatelets: Secondary | ICD-10-CM

## 2019-07-19 DIAGNOSIS — Z8541 Personal history of malignant neoplasm of cervix uteri: Secondary | ICD-10-CM

## 2019-07-19 DIAGNOSIS — F17210 Nicotine dependence, cigarettes, uncomplicated: Secondary | ICD-10-CM | POA: Diagnosis present

## 2019-07-19 DIAGNOSIS — Z803 Family history of malignant neoplasm of breast: Secondary | ICD-10-CM

## 2019-07-19 DIAGNOSIS — M549 Dorsalgia, unspecified: Secondary | ICD-10-CM | POA: Diagnosis present

## 2019-07-19 DIAGNOSIS — F319 Bipolar disorder, unspecified: Secondary | ICD-10-CM | POA: Diagnosis present

## 2019-07-19 DIAGNOSIS — Z808 Family history of malignant neoplasm of other organs or systems: Secondary | ICD-10-CM

## 2019-07-19 DIAGNOSIS — Z83438 Family history of other disorder of lipoprotein metabolism and other lipidemia: Secondary | ICD-10-CM

## 2019-07-19 DIAGNOSIS — S82302A Unspecified fracture of lower end of left tibia, initial encounter for closed fracture: Principal | ICD-10-CM | POA: Diagnosis present

## 2019-07-19 DIAGNOSIS — I6529 Occlusion and stenosis of unspecified carotid artery: Secondary | ICD-10-CM | POA: Diagnosis present

## 2019-07-19 DIAGNOSIS — F32A Depression, unspecified: Secondary | ICD-10-CM | POA: Diagnosis present

## 2019-07-19 DIAGNOSIS — F419 Anxiety disorder, unspecified: Secondary | ICD-10-CM | POA: Diagnosis present

## 2019-07-19 DIAGNOSIS — E785 Hyperlipidemia, unspecified: Secondary | ICD-10-CM | POA: Diagnosis present

## 2019-07-19 DIAGNOSIS — Z8261 Family history of arthritis: Secondary | ICD-10-CM

## 2019-07-19 DIAGNOSIS — Z8249 Family history of ischemic heart disease and other diseases of the circulatory system: Secondary | ICD-10-CM

## 2019-07-19 DIAGNOSIS — Z01818 Encounter for other preprocedural examination: Secondary | ICD-10-CM

## 2019-07-19 DIAGNOSIS — Z811 Family history of alcohol abuse and dependence: Secondary | ICD-10-CM

## 2019-07-19 DIAGNOSIS — Z888 Allergy status to other drugs, medicaments and biological substances status: Secondary | ICD-10-CM

## 2019-07-19 DIAGNOSIS — Z823 Family history of stroke: Secondary | ICD-10-CM

## 2019-07-19 HISTORY — DX: Other specified postprocedural states: Z98.890

## 2019-07-19 HISTORY — DX: Prediabetes: R73.03

## 2019-07-19 HISTORY — DX: Failed or difficult intubation, initial encounter: T88.4XXA

## 2019-07-19 HISTORY — DX: Acute myocardial infarction, unspecified: I21.9

## 2019-07-19 HISTORY — DX: Occlusion and stenosis of unspecified carotid artery: I65.29

## 2019-07-19 HISTORY — DX: Presence of dental prosthetic device (complete) (partial): Z97.2

## 2019-07-19 HISTORY — PX: IM NAILING TIBIA: SUR734

## 2019-07-19 HISTORY — DX: Fatty (change of) liver, not elsewhere classified: K76.0

## 2019-07-19 HISTORY — DX: Cerebral infarction, unspecified: I63.9

## 2019-07-19 HISTORY — DX: Other injury of unspecified body region, initial encounter: T14.8XXA

## 2019-07-19 HISTORY — DX: Nausea with vomiting, unspecified: R11.2

## 2019-07-19 HISTORY — DX: Other reaction to spinal and lumbar puncture: G97.1

## 2019-07-19 HISTORY — DX: Bipolar disorder, unspecified: F31.9

## 2019-07-19 HISTORY — DX: Polyneuropathy, unspecified: G62.9

## 2019-07-19 HISTORY — DX: Unspecified asthma, uncomplicated: J45.909

## 2019-07-19 HISTORY — PX: OPEN REDUCTION INTERNAL FIXATION (ORIF) TIBIA/FIBULA FRACTURE: SHX5992

## 2019-07-19 HISTORY — DX: Presence of spectacles and contact lenses: Z97.3

## 2019-07-19 HISTORY — DX: Fibromyalgia: M79.7

## 2019-07-19 LAB — COMPREHENSIVE METABOLIC PANEL
ALT: 29 U/L (ref 0–44)
AST: 26 U/L (ref 15–41)
Albumin: 3.4 g/dL — ABNORMAL LOW (ref 3.5–5.0)
Alkaline Phosphatase: 92 U/L (ref 38–126)
Anion gap: 13 (ref 5–15)
BUN: 13 mg/dL (ref 6–20)
CO2: 28 mmol/L (ref 22–32)
Calcium: 9.8 mg/dL (ref 8.9–10.3)
Chloride: 99 mmol/L (ref 98–111)
Creatinine, Ser: 0.71 mg/dL (ref 0.44–1.00)
GFR calc Af Amer: >60 mL/min (ref >60)
GFR calc non Af Amer: >60 mL/min (ref >60)
Glucose, Bld: 101 mg/dL — ABNORMAL HIGH (ref 70–99)
Potassium: 4 mmol/L (ref 3.5–5.1)
Sodium: 140 mmol/L (ref 135–145)
Total Bilirubin: 0.2 mg/dL — ABNORMAL LOW (ref 0.3–1.2)
Total Protein: 7.4 g/dL (ref 6.5–8.1)

## 2019-07-19 LAB — CBC
HCT: 30.2 % — ABNORMAL LOW (ref 36.0–46.0)
Hemoglobin: 9 g/dL — ABNORMAL LOW (ref 12.0–15.0)
MCH: 26.8 pg (ref 26.0–34.0)
MCHC: 29.8 g/dL — ABNORMAL LOW (ref 30.0–36.0)
MCV: 89.9 fL (ref 80.0–100.0)
Platelets: 566 10*3/uL — ABNORMAL HIGH (ref 150–400)
RBC: 3.36 MIL/uL — ABNORMAL LOW (ref 3.87–5.11)
RDW: 19.1 % — ABNORMAL HIGH (ref 11.5–15.5)
WBC: 12.7 10*3/uL — ABNORMAL HIGH (ref 4.0–10.5)
nRBC: 0 % (ref 0.0–0.2)

## 2019-07-19 LAB — SURGICAL PCR SCREEN
MRSA, PCR: NEGATIVE
Staphylococcus aureus: NEGATIVE

## 2019-07-19 LAB — CBC WITH DIFFERENTIAL/PLATELET
Abs Immature Granulocytes: 0.07 10*3/uL (ref 0.00–0.07)
Basophils Absolute: 0.1 10*3/uL (ref 0.0–0.1)
Basophils Relative: 1 %
Eosinophils Absolute: 0.3 10*3/uL (ref 0.0–0.5)
Eosinophils Relative: 3 %
HCT: 36.9 % (ref 36.0–46.0)
Hemoglobin: 10.7 g/dL — ABNORMAL LOW (ref 12.0–15.0)
Immature Granulocytes: 1 %
Lymphocytes Relative: 38 %
Lymphs Abs: 3.9 10*3/uL (ref 0.7–4.0)
MCH: 26.1 pg (ref 26.0–34.0)
MCHC: 29 g/dL — ABNORMAL LOW (ref 30.0–36.0)
MCV: 90 fL (ref 80.0–100.0)
Monocytes Absolute: 0.6 10*3/uL (ref 0.1–1.0)
Monocytes Relative: 6 %
Neutro Abs: 5.2 10*3/uL (ref 1.7–7.7)
Neutrophils Relative %: 51 %
Platelets: 655 10*3/uL — ABNORMAL HIGH (ref 150–400)
RBC: 4.1 MIL/uL (ref 3.87–5.11)
RDW: 19.5 % — ABNORMAL HIGH (ref 11.5–15.5)
WBC: 10.2 10*3/uL (ref 4.0–10.5)
nRBC: 0 % (ref 0.0–0.2)

## 2019-07-19 LAB — PROTIME-INR
INR: 0.9 (ref 0.8–1.2)
Prothrombin Time: 12.4 seconds (ref 11.4–15.2)

## 2019-07-19 LAB — CREATININE, SERUM
Creatinine, Ser: 0.69 mg/dL (ref 0.44–1.00)
GFR calc Af Amer: >60 mL/min (ref >60)
GFR calc non Af Amer: >60 mL/min (ref >60)

## 2019-07-19 LAB — RESPIRATORY PANEL BY RT PCR (FLU A&B, COVID)
Influenza A by PCR: NEGATIVE
Influenza B by PCR: NEGATIVE
SARS Coronavirus 2 by RT PCR: NEGATIVE

## 2019-07-19 LAB — APTT: aPTT: 30 seconds (ref 24–36)

## 2019-07-19 LAB — VITAMIN D 25 HYDROXY (VIT D DEFICIENCY, FRACTURES): Vit D, 25-Hydroxy: 16.93 ng/mL — ABNORMAL LOW (ref 30–100)

## 2019-07-19 SURGERY — OPEN REDUCTION INTERNAL FIXATION (ORIF) TIBIA/FIBULA FRACTURE
Anesthesia: Monitor Anesthesia Care | Laterality: Left

## 2019-07-19 MED ORDER — DOCUSATE SODIUM 100 MG PO CAPS
100.0000 mg | ORAL_CAPSULE | Freq: Two times a day (BID) | ORAL | Status: DC
  Administered 2019-07-19 – 2019-07-20 (×3): 100 mg via ORAL
  Filled 2019-07-19 (×3): qty 1

## 2019-07-19 MED ORDER — MIDAZOLAM HCL 2 MG/2ML IJ SOLN
INTRAMUSCULAR | Status: AC
  Administered 2019-07-19: 08:00:00 1 mg via INTRAVENOUS
  Filled 2019-07-19: qty 2

## 2019-07-19 MED ORDER — PROPOFOL 500 MG/50ML IV EMUL
INTRAVENOUS | Status: DC | PRN
  Administered 2019-07-19: 25 ug/kg/min via INTRAVENOUS

## 2019-07-19 MED ORDER — METHOCARBAMOL 1000 MG/10ML IJ SOLN
500.0000 mg | Freq: Four times a day (QID) | INTRAVENOUS | Status: DC | PRN
  Filled 2019-07-19: qty 5

## 2019-07-19 MED ORDER — PANTOPRAZOLE SODIUM 40 MG PO TBEC
40.0000 mg | DELAYED_RELEASE_TABLET | Freq: Every day | ORAL | Status: DC
  Administered 2019-07-20: 10:00:00 40 mg via ORAL
  Filled 2019-07-19: qty 1

## 2019-07-19 MED ORDER — OXYCODONE-ACETAMINOPHEN 5-325 MG PO TABS
1.0000 | ORAL_TABLET | Freq: Four times a day (QID) | ORAL | Status: DC | PRN
  Administered 2019-07-20 (×2): 2 via ORAL
  Filled 2019-07-19 (×3): qty 2

## 2019-07-19 MED ORDER — ENOXAPARIN SODIUM 40 MG/0.4ML ~~LOC~~ SOLN
40.0000 mg | SUBCUTANEOUS | Status: DC
  Administered 2019-07-20: 10:00:00 40 mg via SUBCUTANEOUS
  Filled 2019-07-19: qty 0.4

## 2019-07-19 MED ORDER — LACTATED RINGERS IV SOLN: INTRAVENOUS | Status: DC

## 2019-07-19 MED ORDER — ONDANSETRON HCL 4 MG/2ML IJ SOLN: 4.0000 mg | Freq: Four times a day (QID) | INTRAMUSCULAR | Status: DC | PRN

## 2019-07-19 MED ORDER — NITROGLYCERIN 0.4 MG SL SUBL: 0.4000 mg | SUBLINGUAL_TABLET | SUBLINGUAL | Status: DC | PRN

## 2019-07-19 MED ORDER — FENTANYL CITRATE (PF) 250 MCG/5ML IJ SOLN
INTRAMUSCULAR | Status: AC
  Filled 2019-07-19: qty 5

## 2019-07-19 MED ORDER — ALBUTEROL SULFATE (2.5 MG/3ML) 0.083% IN NEBU: 3.0000 mL | INHALATION_SOLUTION | Freq: Four times a day (QID) | RESPIRATORY_TRACT | Status: DC | PRN

## 2019-07-19 MED ORDER — FENTANYL CITRATE (PF) 100 MCG/2ML IJ SOLN
INTRAMUSCULAR | Status: AC
  Administered 2019-07-19: 08:00:00 50 ug via INTRAVENOUS
  Filled 2019-07-19: qty 2

## 2019-07-19 MED ORDER — TRAZODONE HCL 50 MG PO TABS
100.0000 mg | ORAL_TABLET | Freq: Every day | ORAL | Status: DC
  Administered 2019-07-19: 100 mg via ORAL
  Filled 2019-07-19: qty 2

## 2019-07-19 MED ORDER — CHLORHEXIDINE GLUCONATE 4 % EX LIQD: 60.0000 mL | Freq: Once | CUTANEOUS | Status: DC

## 2019-07-19 MED ORDER — CEFAZOLIN SODIUM-DEXTROSE 2-4 GM/100ML-% IV SOLN
2.0000 g | INTRAVENOUS | Status: AC
  Administered 2019-07-19: 2 g via INTRAVENOUS
  Filled 2019-07-19: qty 100

## 2019-07-19 MED ORDER — CLOPIDOGREL BISULFATE 75 MG PO TABS
75.0000 mg | ORAL_TABLET | Freq: Every day | ORAL | Status: DC
  Administered 2019-07-19 – 2019-07-20 (×2): 75 mg via ORAL
  Filled 2019-07-19 (×2): qty 1

## 2019-07-19 MED ORDER — CEFAZOLIN SODIUM-DEXTROSE 1-4 GM/50ML-% IV SOLN
1.0000 g | Freq: Four times a day (QID) | INTRAVENOUS | Status: AC
  Administered 2019-07-19 – 2019-07-20 (×3): 1 g via INTRAVENOUS
  Filled 2019-07-19 (×3): qty 50

## 2019-07-19 MED ORDER — ONDANSETRON HCL 4 MG PO TABS: 4.0000 mg | ORAL_TABLET | Freq: Four times a day (QID) | ORAL | Status: DC | PRN

## 2019-07-19 MED ORDER — FENTANYL CITRATE (PF) 100 MCG/2ML IJ SOLN: 25.0000 ug | INTRAMUSCULAR | Status: DC | PRN

## 2019-07-19 MED ORDER — GABAPENTIN 600 MG PO TABS
600.0000 mg | ORAL_TABLET | Freq: Three times a day (TID) | ORAL | Status: DC
  Administered 2019-07-19 – 2019-07-20 (×3): 600 mg via ORAL
  Filled 2019-07-19 (×5): qty 1

## 2019-07-19 MED ORDER — PROPOFOL 10 MG/ML IV BOLUS
INTRAVENOUS | Status: AC
  Filled 2019-07-19: qty 20

## 2019-07-19 MED ORDER — OXYCODONE HCL 5 MG/5ML PO SOLN: 5.0000 mg | Freq: Once | ORAL | Status: DC | PRN

## 2019-07-19 MED ORDER — ACETAMINOPHEN 500 MG PO TABS
500.0000 mg | ORAL_TABLET | Freq: Two times a day (BID) | ORAL | Status: DC
  Administered 2019-07-19 – 2019-07-20 (×3): 500 mg via ORAL
  Filled 2019-07-19 (×3): qty 1

## 2019-07-19 MED ORDER — SUCCINYLCHOLINE CHLORIDE 200 MG/10ML IV SOSY
PREFILLED_SYRINGE | INTRAVENOUS | Status: AC
  Filled 2019-07-19: qty 10

## 2019-07-19 MED ORDER — POVIDONE-IODINE 10 % EX SWAB
2.0000 "application " | Freq: Once | CUTANEOUS | Status: AC
  Administered 2019-07-19: 2 via TOPICAL

## 2019-07-19 MED ORDER — FENTANYL CITRATE (PF) 100 MCG/2ML IJ SOLN
INTRAMUSCULAR | Status: DC | PRN
  Administered 2019-07-19: 100 ug via INTRAVENOUS

## 2019-07-19 MED ORDER — LIDOCAINE 2% (20 MG/ML) 5 ML SYRINGE
INTRAMUSCULAR | Status: AC
  Filled 2019-07-19: qty 5

## 2019-07-19 MED ORDER — PRAZOSIN HCL 2 MG PO CAPS
2.0000 mg | ORAL_CAPSULE | Freq: Every day | ORAL | Status: DC
  Administered 2019-07-19: 21:00:00 2 mg via ORAL
  Filled 2019-07-19 (×2): qty 1

## 2019-07-19 MED ORDER — METOCLOPRAMIDE HCL 5 MG PO TABS: 5.0000 mg | ORAL_TABLET | Freq: Three times a day (TID) | ORAL | Status: DC | PRN

## 2019-07-19 MED ORDER — POTASSIUM CHLORIDE IN NACL 20-0.9 MEQ/L-% IV SOLN
INTRAVENOUS | Status: DC
  Filled 2019-07-19: qty 1000

## 2019-07-19 MED ORDER — DULOXETINE HCL 60 MG PO CPEP
60.0000 mg | ORAL_CAPSULE | Freq: Every day | ORAL | Status: DC
  Administered 2019-07-20: 10:00:00 60 mg via ORAL
  Filled 2019-07-19: qty 1

## 2019-07-19 MED ORDER — MIDAZOLAM HCL 2 MG/2ML IJ SOLN
INTRAMUSCULAR | Status: AC
  Filled 2019-07-19: qty 2

## 2019-07-19 MED ORDER — MORPHINE SULFATE (PF) 2 MG/ML IV SOLN
0.5000 mg | INTRAVENOUS | Status: DC | PRN
  Filled 2019-07-19: qty 1

## 2019-07-19 MED ORDER — ROPIVACAINE HCL 5 MG/ML IJ SOLN
INTRAMUSCULAR | Status: DC | PRN
  Administered 2019-07-19: 15 mL via PERINEURAL
  Administered 2019-07-19: 25 mL via PERINEURAL

## 2019-07-19 MED ORDER — MIDAZOLAM HCL 2 MG/2ML IJ SOLN: 1.0000 mg | Freq: Once | INTRAMUSCULAR | Status: AC

## 2019-07-19 MED ORDER — METOCLOPRAMIDE HCL 5 MG/ML IJ SOLN: 5.0000 mg | Freq: Three times a day (TID) | INTRAMUSCULAR | Status: DC | PRN

## 2019-07-19 MED ORDER — 0.9 % SODIUM CHLORIDE (POUR BTL) OPTIME
TOPICAL | Status: DC | PRN
  Administered 2019-07-19: 09:00:00 1000 mL

## 2019-07-19 MED ORDER — MIDAZOLAM HCL 5 MG/5ML IJ SOLN
INTRAMUSCULAR | Status: DC | PRN
  Administered 2019-07-19: 2 mg via INTRAVENOUS

## 2019-07-19 MED ORDER — FENTANYL CITRATE (PF) 100 MCG/2ML IJ SOLN: 50.0000 ug | Freq: Once | INTRAMUSCULAR | Status: AC

## 2019-07-19 MED ORDER — GLUCERNA SHAKE PO LIQD
237.0000 mL | Freq: Two times a day (BID) | ORAL | Status: DC
  Administered 2019-07-19 – 2019-07-20 (×2): 237 mL via ORAL

## 2019-07-19 MED ORDER — LEVETIRACETAM 500 MG PO TABS
500.0000 mg | ORAL_TABLET | Freq: Two times a day (BID) | ORAL | Status: DC
  Administered 2019-07-19 – 2019-07-20 (×2): 500 mg via ORAL
  Filled 2019-07-19 (×2): qty 1

## 2019-07-19 MED ORDER — PHENYLEPHRINE HCL-NACL 10-0.9 MG/250ML-% IV SOLN
INTRAVENOUS | Status: DC | PRN
  Administered 2019-07-19: 50 ug/min via INTRAVENOUS

## 2019-07-19 MED ORDER — OXYCODONE HCL 5 MG PO TABS: 5.0000 mg | ORAL_TABLET | Freq: Once | ORAL | Status: DC | PRN

## 2019-07-19 MED ORDER — BACLOFEN 10 MG PO TABS
20.0000 mg | ORAL_TABLET | Freq: Every day | ORAL | Status: DC
  Administered 2019-07-19 – 2019-07-20 (×2): 20 mg via ORAL
  Filled 2019-07-19 (×2): qty 2

## 2019-07-19 MED ORDER — METHOCARBAMOL 500 MG PO TABS
500.0000 mg | ORAL_TABLET | Freq: Four times a day (QID) | ORAL | Status: DC | PRN
  Administered 2019-07-20: 02:00:00 500 mg via ORAL
  Filled 2019-07-19 (×2): qty 1

## 2019-07-19 SURGICAL SUPPLY — 75 items
BANDAGE ESMARK 6X9 LF (GAUZE/BANDAGES/DRESSINGS) ×1 IMPLANT
BIT DRILL 2.4X140 LONG SOLID (BIT) ×2 IMPLANT
BIT DRILL 2.5X2.75 QC CALB (BIT) ×2 IMPLANT
BIT DRILL 3.5X5.5 QC CALB (BIT) ×2 IMPLANT
BIT DRILL CALIBRATED 2.7 (BIT) ×1 IMPLANT
BIT DRILL CALIBRATED 2.7MM (BIT) ×1
BNDG COHESIVE 6X5 TAN STRL LF (GAUZE/BANDAGES/DRESSINGS) IMPLANT
BNDG ELASTIC 4X5.8 VLCR STR LF (GAUZE/BANDAGES/DRESSINGS) ×3 IMPLANT
BNDG ELASTIC 6X5.8 VLCR STR LF (GAUZE/BANDAGES/DRESSINGS) ×3 IMPLANT
BNDG ESMARK 6X9 LF (GAUZE/BANDAGES/DRESSINGS) ×3
BNDG GAUZE ELAST 4 BULKY (GAUZE/BANDAGES/DRESSINGS) ×4 IMPLANT
BRUSH SCRUB EZ PLAIN DRY (MISCELLANEOUS) ×6 IMPLANT
CLOSURE WOUND 1/2 X4 (GAUZE/BANDAGES/DRESSINGS)
COVER SURGICAL LIGHT HANDLE (MISCELLANEOUS) ×6 IMPLANT
COVER WAND RF STERILE (DRAPES) ×3 IMPLANT
CUFF TOURN SGL QUICK 18X4 (TOURNIQUET CUFF) IMPLANT
DRAPE C-ARM 42X72 X-RAY (DRAPES) IMPLANT
DRAPE C-ARMOR (DRAPES) ×3 IMPLANT
DRAPE U-SHAPE 47X51 STRL (DRAPES) ×3 IMPLANT
DRSG ADAPTIC 3X8 NADH LF (GAUZE/BANDAGES/DRESSINGS) ×3 IMPLANT
DRSG MEPITEL 4X7.2 (GAUZE/BANDAGES/DRESSINGS) IMPLANT
ELECT REM PT RETURN 9FT ADLT (ELECTROSURGICAL) ×3
ELECTRODE REM PT RTRN 9FT ADLT (ELECTROSURGICAL) ×1 IMPLANT
EVACUATOR 1/8 PVC DRAIN (DRAIN) IMPLANT
GAUZE SPONGE 4X4 12PLY STRL (GAUZE/BANDAGES/DRESSINGS) ×5 IMPLANT
GLOVE BIO SURGEON STRL SZ7.5 (GLOVE) ×3 IMPLANT
GLOVE BIO SURGEON STRL SZ8 (GLOVE) ×3 IMPLANT
GLOVE BIOGEL PI IND STRL 7.5 (GLOVE) ×1 IMPLANT
GLOVE BIOGEL PI IND STRL 8 (GLOVE) ×1 IMPLANT
GLOVE BIOGEL PI INDICATOR 7.5 (GLOVE) ×2
GLOVE BIOGEL PI INDICATOR 8 (GLOVE) ×2
GOWN STRL REUS W/ TWL LRG LVL3 (GOWN DISPOSABLE) ×2 IMPLANT
GOWN STRL REUS W/ TWL XL LVL3 (GOWN DISPOSABLE) ×1 IMPLANT
GOWN STRL REUS W/TWL LRG LVL3 (GOWN DISPOSABLE) ×4
GOWN STRL REUS W/TWL XL LVL3 (GOWN DISPOSABLE) ×2
K-WIRE ACE 1.6X6 (WIRE) ×3
KIT BASIN OR (CUSTOM PROCEDURE TRAY) ×3 IMPLANT
KIT TURNOVER KIT B (KITS) ×3 IMPLANT
KWIRE ACE 1.6X6 (WIRE) IMPLANT
MANIFOLD NEPTUNE II (INSTRUMENTS) ×3 IMPLANT
NEEDLE 22X1 1/2 (OR ONLY) (NEEDLE) IMPLANT
NS IRRIG 1000ML POUR BTL (IV SOLUTION) ×3 IMPLANT
PACK ORTHO EXTREMITY (CUSTOM PROCEDURE TRAY) ×3 IMPLANT
PAD ABD 8X10 STRL (GAUZE/BANDAGES/DRESSINGS) ×4 IMPLANT
PAD ARMBOARD 7.5X6 YLW CONV (MISCELLANEOUS) ×6 IMPLANT
PAD CAST 4YDX4 CTTN HI CHSV (CAST SUPPLIES) IMPLANT
PADDING CAST COTTON 4X4 STRL (CAST SUPPLIES) ×2
PADDING CAST COTTON 6X4 STRL (CAST SUPPLIES) ×4 IMPLANT
PLATE 6H 142 LT MED DIST TIB (Plate) ×2 IMPLANT
PLATE FIBULAR CL 11H LT (Plate) ×2 IMPLANT
SCREW 3.5X16 NONLOCKING (Screw) ×2 IMPLANT
SCREW CORT FT 32X3.5XNONLOCK (Screw) IMPLANT
SCREW CORTICAL 3.5MM  32MM (Screw) ×2 IMPLANT
SCREW CORTICAL 3.5MM 22MM (Screw) ×2 IMPLANT
SCREW CORTICAL 3.5MM 24MM (Screw) ×4 IMPLANT
SCREW LOCK CORT STAR 3.5X36 (Screw) ×4 IMPLANT
SCREW LOCK CORT STAR 3.5X40 (Screw) ×6 IMPLANT
SCREW LOCK PLATE R3 3.5X10 (Screw) ×2 IMPLANT
SCREW LOCK PLATE R3 3.5X12 (Screw) ×4 IMPLANT
SCREW LOCK PLATE R3 3.5X14 (Screw) ×2 IMPLANT
SCREW LOCK PLATE R3 3.5X16 (Screw) ×6 IMPLANT
SCREW LOW PROFILE 3.5MMX42 (Screw) ×2 IMPLANT
SCREW LOW PROFILE 3.5X48MM (Screw) ×2 IMPLANT
SCREW NON LOCKING 3.5X12 (Screw) ×2 IMPLANT
SCREW NON LOCKING 3.5X14 (Screw) ×2 IMPLANT
SPONGE LAP 18X18 RF (DISPOSABLE) ×3 IMPLANT
STAPLER VISISTAT 35W (STAPLE) IMPLANT
STOCKINETTE IMPERVIOUS LG (DRAPES) IMPLANT
STRIP CLOSURE SKIN 1/2X4 (GAUZE/BANDAGES/DRESSINGS) IMPLANT
SUT ETHILON 3 0 PS 1 (SUTURE) ×6 IMPLANT
SUT VIC AB 2-0 CT1 27 (SUTURE) ×4
SUT VIC AB 2-0 CT1 TAPERPNT 27 (SUTURE) IMPLANT
TOWEL GREEN STERILE (TOWEL DISPOSABLE) ×6 IMPLANT
TOWEL GREEN STERILE FF (TOWEL DISPOSABLE) ×6 IMPLANT
UNDERPAD 30X30 (UNDERPADS AND DIAPERS) ×3 IMPLANT

## 2019-07-19 NOTE — Progress Notes (Signed)
Patient had $23 in her possession ($20x1; $1x3). Money counted (in presence of patient) and verified by 2 nurses: Jovita Kussmaul, RN and Feliz Beam, RN. Money sealed in safety envelope and sent to security.

## 2019-07-19 NOTE — Progress Notes (Signed)
Patient's medications counted and verified by two nurses Jovita Kussmaul, RN and Feliz Beam, medication walked down to pharmacy. Medication forms sent to pharmacy, copy in chart.

## 2019-07-19 NOTE — Anesthesia Procedure Notes (Signed)
Anesthesia Regional Block: Popliteal block   Pre-Anesthetic Checklist: ,, timeout performed, Correct Patient, Correct Site, Correct Laterality, Correct Procedure, Correct Position, site marked, Risks and benefits discussed,  Surgical consent,  Pre-op evaluation,  At surgeon's request and post-op pain management  Laterality: Left  Prep: chloraprep       Needles:  Injection technique: Single-shot  Needle Type: Echogenic Stimulator Needle     Needle Length: 9cm  Needle Gauge: 21     Additional Needles:   Procedures:,,,, ultrasound used (permanent image in chart),,,,  Narrative:  Start time: 07/19/2019 7:32 AM End time: 07/19/2019 7:35 AM Injection made incrementally with aspirations every 5 mL.  Performed by: Personally  Anesthesiologist: Lidia Collum, MD  Additional Notes: Monitors applied. Injection made in 5cc increments. No resistance to injection. Good needle visualization. Patient tolerated procedure well.

## 2019-07-19 NOTE — Anesthesia Procedure Notes (Signed)
Anesthesia Regional Block: Adductor canal block   Pre-Anesthetic Checklist: ,, timeout performed, Correct Patient, Correct Site, Correct Laterality, Correct Procedure, Correct Position, site marked, Risks and benefits discussed,  Surgical consent,  Pre-op evaluation,  At surgeon's request and post-op pain management  Laterality: Left  Prep: chloraprep       Needles:  Injection technique: Single-shot  Needle Type: Echogenic Stimulator Needle     Needle Length: 9cm  Needle Gauge: 21     Additional Needles:   Procedures:,,,, ultrasound used (permanent image in chart),,,,  Narrative:  Start time: 07/19/2019 7:28 AM End time: 07/19/2019 7:32 AM Injection made incrementally with aspirations every 5 mL.  Performed by: Personally  Anesthesiologist: Lidia Collum, MD  Additional Notes: Monitors applied. Injection made in 5cc increments. No resistance to injection. Good needle visualization. Patient tolerated procedure well.

## 2019-07-19 NOTE — Transfer of Care (Signed)
Immediate Anesthesia Transfer of Care Note  Patient: Ginna A Deike  Procedure(s) Performed: OPEN REDUCTION INTERAL FIXATION (ORIF) TIBIA/FIBULA FRACTURE (Left )  Patient Location: PACU  Anesthesia Type:MAC combined with regional for post-op pain  Level of Consciousness: drowsy and patient cooperative  Airway & Oxygen Therapy: Patient Spontanous Breathing  Post-op Assessment: Report given to RN and Post -op Vital signs reviewed and stable  Post vital signs: Reviewed and stable  Last Vitals:  Vitals Value Taken Time  BP 130/68 07/19/19 1121  Temp    Pulse 120 07/19/19 1120  Resp 25 07/19/19 1121  SpO2 73 % 07/19/19 1120  Vitals shown include unvalidated device data.  Last Pain:  Vitals:   07/19/19 0620  TempSrc:   PainSc: 8       Patients Stated Pain Goal: 4 (01/75/10 2585)  Complications: No apparent anesthesia complications

## 2019-07-19 NOTE — Anesthesia Postprocedure Evaluation (Signed)
Anesthesia Post Note  Patient: Milliani A Douthitt  Procedure(s) Performed: OPEN REDUCTION INTERAL FIXATION (ORIF) TIBIA/FIBULA FRACTURE (Left )     Patient location during evaluation: PACU Anesthesia Type: Regional and MAC Level of consciousness: awake and alert Pain management: pain level controlled Vital Signs Assessment: post-procedure vital signs reviewed and stable Respiratory status: spontaneous breathing, nonlabored ventilation and respiratory function stable Cardiovascular status: blood pressure returned to baseline and stable Postop Assessment: no apparent nausea or vomiting Anesthetic complications: no    Last Vitals:  Vitals:   07/19/19 1121 07/19/19 1136  BP: 130/68 119/70  Pulse: (!) 120 83  Resp: 15 17  Temp: (!) 36.3 C   SpO2: (!) 88% 95%    Last Pain:  Vitals:   07/19/19 1136  TempSrc:   PainSc: 0-No pain                 Lidia Collum

## 2019-07-19 NOTE — Evaluation (Addendum)
Physical Therapy Evaluation Patient Details Name: Lauren Cross MRN: [id number] DOB: 01/05/1976 Today's Date: 07/19/2019   History of Present Illness  Pt is a 44 y/o female s/p ORIF L tib-fib fx secondary to ground-level fall with complex left distal tibia and fibula fracture. Of note, pt initially admitted on 07/04/19 to a hospital in [place] for the fall which was found to be related to her having a stroke. PMH including but not limited to HLD, leukemia, ?MS (work-up in progress), seizures.     Clinical Impression  Pt presented supine in bed with HOB elevated, awake and willing to participate in therapy session. Prior to admission, pt reported that she was independent with all functional mobility and ADLs. Pt lives with her mother and 78 y/o daughter in a single level home with a few steps to enter. At the time of evaluation, pt moving well overall. She performed bed mobility, transfers and short distance ambulation with RW and min guard for safety. Pt able to maintaining Lane L LE throughout independently. Pt will need stair training at next session prior to d/c home. Pt would continue to benefit from skilled physical therapy services at this time while admitted and after d/c to address the below listed limitations in order to improve overall safety and independence with functional mobility.     Follow Up Recommendations Home health PT;Supervision for mobility/OOB    Equipment Recommendations  3in1 (PT)    Recommendations for Other Services       Precautions / Restrictions Precautions Precautions: Fall Restrictions Weight Bearing Restrictions: Yes LLE Weight Bearing: Non weight bearing      Mobility  Bed Mobility Overal bed mobility: Needs Assistance Bed Mobility: Supine to Sit     Supine to sit: Min guard     General bed mobility comments: increased time and effort, no physical assistance needed  Transfers Overall transfer level: Needs assistance Equipment used: Rolling  walker (2 wheeled) Transfers: Sit to/from Stand Sit to Stand: Min guard         General transfer comment: cueing and demonstration for technique, min guard for safety  Ambulation/Gait Ambulation/Gait assistance: Min guard Gait Distance (Feet): 30 Feet Assistive device: Rolling walker (2 wheeled) Gait Pattern/deviations: (hop-to on R LE) Gait velocity: decreased   General Gait Details: pt with mild instability but no overt LOB or need for physical assistance, min guard and cueing for safety and technique with RW  Stairs            Wheelchair Mobility    Modified Rankin (Stroke Patients Only)       Balance Overall balance assessment: Needs assistance Sitting-balance support: Feet supported Sitting balance-Leahy Scale: Fair     Standing balance support: Bilateral upper extremity supported;Single extremity supported Standing balance-Leahy Scale: Poor                               Pertinent Vitals/Pain Pain Assessment: No/denies pain    Home Living Family/patient expects to be discharged to:: Private residence Living Arrangements: Parent;Children Available Help at Discharge: Family Type of Home: House Home Access: Stairs to enter Entrance Stairs-Rails: Psychiatric nurse of Steps: 3 Home Layout: One level Home Equipment: Clinical cytogeneticist - 2 wheels      Prior Function Level of Independence: Independent               Hand Dominance   Dominant Hand: Right    Extremity/Trunk Assessment   Upper  Extremity Assessment Upper Extremity Assessment: Overall WFL for tasks assessed    Lower Extremity Assessment Lower Extremity Assessment: LLE deficits/detail LLE Deficits / Details: pt with decreased strength and ROM limitations secondary to post-op pain and weakness. Pt with no sensation to toes LLE: Unable to fully assess due to pain;Unable to fully assess due to immobilization    Cervical / Trunk Assessment Cervical /  Trunk Assessment: Normal  Communication   Communication: No difficulties  Cognition Arousal/Alertness: Lethargic Behavior During Therapy: Flat affect Overall Cognitive Status: Impaired/Different from baseline Area of Impairment: Problem solving                             Problem Solving: Slow processing;Decreased initiation;Difficulty sequencing;Requires verbal cues        General Comments      Exercises General Exercises - Lower Extremity Long Arc Quad: AROM;Strengthening;Left;5 reps;Seated   Assessment/Plan    PT Assessment Patient needs continued PT services  PT Problem List Decreased strength;Decreased balance;Decreased mobility;Decreased coordination;Decreased cognition;Decreased knowledge of use of DME;Decreased safety awareness;Decreased knowledge of precautions       PT Treatment Interventions DME instruction;Gait training;Stair training;Functional mobility training;Therapeutic activities;Therapeutic exercise;Balance training;Cognitive remediation;Neuromuscular re-education;Patient/family education    PT Goals (Current goals can be found in the Care Plan section)  Acute Rehab PT Goals Patient Stated Goal: to go home PT Goal Formulation: With patient Time For Goal Achievement: 08/02/19 Potential to Achieve Goals: Good    Frequency Min 3X/week   Barriers to discharge        Co-evaluation               AM-PAC PT "6 Clicks" Mobility  Outcome Measure Help needed turning from your back to your side while in a flat bed without using bedrails?: None Help needed moving from lying on your back to sitting on the side of a flat bed without using bedrails?: None Help needed moving to and from a bed to a chair (including a wheelchair)?: A Little Help needed standing up from a chair using your arms (e.g., wheelchair or bedside chair)?: A Little Help needed to walk in hospital room?: A Little Help needed climbing 3-5 steps with a railing? : A Lot 6 Click  Score: 19    End of Session Equipment Utilized During Treatment: Gait belt Activity Tolerance: Patient tolerated treatment well Patient left: in chair;with call bell/phone within reach;with chair alarm set Nurse Communication: Mobility status PT Visit Diagnosis: Other abnormalities of gait and mobility (R26.89)    Time: 1447-1510 PT Time Calculation (min) (ACUTE ONLY): 23 min   Charges:   PT Evaluation $PT Eval Moderate Complexity: 1 Mod PT Treatments $Gait Training: 8-22 mins        Anastasio Champion, DPT  Acute Rehabilitation Services Pager [phone] Office [place] 07/19/2019, 3:52 PM

## 2019-07-20 ENCOUNTER — Encounter (HOSPITAL_COMMUNITY): Payer: Self-pay | Admitting: Orthopedic Surgery

## 2019-07-20 DIAGNOSIS — M797 Fibromyalgia: Secondary | ICD-10-CM | POA: Diagnosis present

## 2019-07-20 DIAGNOSIS — F419 Anxiety disorder, unspecified: Secondary | ICD-10-CM | POA: Diagnosis present

## 2019-07-20 DIAGNOSIS — I6529 Occlusion and stenosis of unspecified carotid artery: Secondary | ICD-10-CM | POA: Diagnosis present

## 2019-07-20 DIAGNOSIS — F319 Bipolar disorder, unspecified: Secondary | ICD-10-CM | POA: Diagnosis present

## 2019-07-20 DIAGNOSIS — J45909 Unspecified asthma, uncomplicated: Secondary | ICD-10-CM | POA: Diagnosis present

## 2019-07-20 DIAGNOSIS — E559 Vitamin D deficiency, unspecified: Secondary | ICD-10-CM

## 2019-07-20 DIAGNOSIS — F329 Major depressive disorder, single episode, unspecified: Secondary | ICD-10-CM | POA: Diagnosis present

## 2019-07-20 DIAGNOSIS — E785 Hyperlipidemia, unspecified: Secondary | ICD-10-CM | POA: Diagnosis present

## 2019-07-20 DIAGNOSIS — R569 Unspecified convulsions: Secondary | ICD-10-CM

## 2019-07-20 DIAGNOSIS — G629 Polyneuropathy, unspecified: Secondary | ICD-10-CM

## 2019-07-20 HISTORY — DX: Vitamin D deficiency, unspecified: E55.9

## 2019-07-20 LAB — COMPREHENSIVE METABOLIC PANEL
ALT: 19 U/L (ref 0–44)
AST: 18 U/L (ref 15–41)
Albumin: 2.6 g/dL — ABNORMAL LOW (ref 3.5–5.0)
Alkaline Phosphatase: 79 U/L (ref 38–126)
Anion gap: 12 (ref 5–15)
BUN: 6 mg/dL (ref 6–20)
CO2: 28 mmol/L (ref 22–32)
Calcium: 8.6 mg/dL — ABNORMAL LOW (ref 8.9–10.3)
Chloride: 99 mmol/L (ref 98–111)
Creatinine, Ser: 0.75 mg/dL (ref 0.44–1.00)
GFR calc Af Amer: >60 mL/min (ref >60)
GFR calc non Af Amer: >60 mL/min (ref >60)
Glucose, Bld: 105 mg/dL — ABNORMAL HIGH (ref 70–99)
Potassium: 3.8 mmol/L (ref 3.5–5.1)
Sodium: 139 mmol/L (ref 135–145)
Total Bilirubin: 0.4 mg/dL (ref 0.3–1.2)
Total Protein: 6 g/dL — ABNORMAL LOW (ref 6.5–8.1)

## 2019-07-20 LAB — URINALYSIS, ROUTINE W REFLEX MICROSCOPIC
Bilirubin Urine: NEGATIVE
Glucose, UA: NEGATIVE mg/dL
Hgb urine dipstick: NEGATIVE
Ketones, ur: NEGATIVE mg/dL
Leukocytes,Ua: NEGATIVE
Nitrite: NEGATIVE
Protein, ur: NEGATIVE mg/dL
Specific Gravity, Urine: 1.009 (ref 1.005–1.030)
pH: 6 (ref 5.0–8.0)

## 2019-07-20 LAB — CBC
HCT: 29.2 % — ABNORMAL LOW (ref 36.0–46.0)
Hemoglobin: 8.7 g/dL — ABNORMAL LOW (ref 12.0–15.0)
MCH: 26.9 pg (ref 26.0–34.0)
MCHC: 29.8 g/dL — ABNORMAL LOW (ref 30.0–36.0)
MCV: 90.1 fL (ref 80.0–100.0)
Platelets: 562 10*3/uL — ABNORMAL HIGH (ref 150–400)
RBC: 3.24 MIL/uL — ABNORMAL LOW (ref 3.87–5.11)
RDW: 18.7 % — ABNORMAL HIGH (ref 11.5–15.5)
WBC: 10.6 10*3/uL — ABNORMAL HIGH (ref 4.0–10.5)
nRBC: 0 % (ref 0.0–0.2)

## 2019-07-20 MED ORDER — GLUCERNA SHAKE PO LIQD: 237.0000 mL | Freq: Two times a day (BID) | ORAL | 0 refills | Status: AC

## 2019-07-20 MED ORDER — DOCUSATE SODIUM 100 MG PO CAPS: 100.0000 mg | ORAL_CAPSULE | Freq: Two times a day (BID) | ORAL | 0 refills | Status: DC

## 2019-07-20 MED ORDER — ENOXAPARIN (LOVENOX) PATIENT EDUCATION KIT: 1.0000 | PACK | Freq: Once | 0 refills | Status: AC

## 2019-07-20 MED ORDER — ENOXAPARIN SODIUM 40 MG/0.4ML ~~LOC~~ SOLN: 40.0000 mg | SUBCUTANEOUS | 0 refills | Status: DC

## 2019-07-20 MED ORDER — OXYCODONE-ACETAMINOPHEN 5-325 MG PO TABS: 1.0000 | ORAL_TABLET | Freq: Four times a day (QID) | ORAL | 0 refills | Status: DC | PRN

## 2019-07-20 NOTE — Progress Notes (Signed)
Discharge instructions given. Reviewed, verbalized understanding. Script given. Saline Lock removed. Reinforced site. Patient out via W/C to security and Pharmacy to obtain belongings.

## 2019-07-20 NOTE — Plan of Care (Signed)
  Problem: Education: Goal: Knowledge of General Education information will improve Description Including pain rating scale, medication(s)/side effects and non-pharmacologic comfort measures Outcome: Progressing   

## 2019-07-20 NOTE — Discharge Summary (Signed)
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 Commonly known as: NITROSTAT Place 0.4 mg under the tongue every 5 (five) minutes as needed for chest pain.   oxyCODONE-acetaminophen 5-325 MG tablet Commonly known as: PERCOCET/ROXICET Take 1-2 tablets by mouth every 6 (six) hours as needed for moderate pain or severe pain.   pantoprazole 40 MG tablet Commonly known as: PROTONIX TAKE (1) TABLET BY MOUTH TWICE A DAY BEFORE MEALS. (BREAKFAST AND SUPPER) What changed: See the new instructions.   Potassium Chloride ER 20 MEQ Tbcr Take 20 mEq by mouth 2 (two) times daily for 5 days.   promethazine 25 MG tablet Commonly known as: PHENERGAN Take 12.5 mg by mouth every 6 (six) hours as needed for nausea or vomiting.   rizatriptan 10  MG tablet Commonly known as: MAXALT Take 10 mg by mouth as needed for migraine. May repeat in 2 hours if  needed   rosuvastatin 40 MG tablet Commonly known as: CRESTOR Take 40 mg by mouth at bedtime.   traZODone 100 MG tablet Commonly known as: DESYREL Take 100 mg by mouth at bedtime as needed for sleep.            Discharge Care Instructions  (From admission, onward)         Start     Ordered   07/20/19 0000  Non weight bearing    Question Answer Comment  Laterality left   Extremity Lower      07/20/19 1143         Follow-up Information    Altamese Warroad, MD. Schedule an appointment as soon as possible for a visit in 2 week(s).   Specialty: Orthopedic Surgery Why: follow up for left leg Contact information: [place] [address] [phone]           Discharge Instructions and Plan:  Patient has sustained a significant injury to her left distal tibia and fibula.  Fortunately we are able to proceed with definitive fixation elevation.  She will be nonweightbearing.  She will be splinted for 2 weeks and then converted into a cam boot for ankle range of motion.  She is encouraged to continue to use crutches to facilitate mobilization.  She has numerous medical comorbid conditions and is on numerous medications which impair bone healing and hardware associated with poor bone quality.  She was found to be vitamin D deficient during this admission and will be aggressive with supplementation.  Patient will follow-up in 2 weeks for removal of her splint and conversion to cam boot.  We will also perform follow-up x-rays and removal of her sutures at that time.  We did reiterate the importance of smoking cessation for bone and wound healing  Patient again discharged stable condition on 07/20/2019.  Questions were encouraged and answered.  She will contact the office with any questions or concerns that arise after discharge  Signed:  Jari Pigg, PA-C [phone] (C) 07/20/2019, 11:47 AM  [facility] [place]  [id number] [phone] Domingo Sep (F)

## 2019-07-20 NOTE — Progress Notes (Signed)
                               [facility] Progress Note  Patient ID: Lauren Cross MRN: [id number] DOB/AGE: 46/08/1975 46 y.o.  Subjective:  Doing better this morning Feels better than she did with the cast on.  Happy that her left leg is now fixed. She is quite pleasant this morning Block wore off around midnight  She is eager to discharge today   Review of Systems  Constitutional: Negative for chills and fever.  Eyes: Negative for blurred vision and double vision.  Respiratory: Negative for shortness of breath and wheezing.   Cardiovascular: Negative for chest pain and palpitations.  Gastrointestinal: Negative for abdominal pain, nausea and vomiting.  Genitourinary: Negative for dysuria.    Objective:   VITALS:   Vitals:   07/19/19 2000 07/19/19 2300 07/20/19 0400 07/20/19 0755  BP: 92/67 95/73 96/62 121/79  Pulse: 78 98 80 92  Resp: 18 18 18 18  Temp: 99.3 F (37.4 C) 98.8 F (37.1 C) 98 F (36.7 C) 98.6 F (37 C)  TempSrc: Oral Oral Oral Oral  SpO2: 100% 93% 98% 98%  Weight:      Height:        Estimated body mass index is 25.79 kg/m as calculated from the following:   Height as of this encounter: 5' 5" (1.651 m).   Weight as of this encounter: 70.3 kg.   Intake/Output      04/01 0701 - 04/02 0700 04/02 0701 - 04/03 0700   I.V. (mL/kg) 2188.6 (31.1)    IV Piggyback 150    Total Intake(mL/kg) 2338.6 (33.3)    Urine (mL/kg/hr) 1600 (0.9)    Blood 100    Total Output 1700    Net +638.6         Urine Occurrence 2 x 1 x     LABS  Results for orders placed or performed during the hospital encounter of 07/19/19 (from the past 24 hour(s))  CBC     Status: Abnormal   Collection Time: 07/19/19  4:30 PM  Result Value Ref Range   WBC 12.7 (H) 4.0 - 10.5 K/uL   RBC 3.36 (L) 3.87 - 5.11 MIL/uL   Hemoglobin 9.0 (L) 12.0 - 15.0 g/dL   HCT 30.2 (L) 36.0 - 46.0 %   MCV 89.9 80.0 - 100.0 fL   MCH 26.8  26.0 - 34.0 pg   MCHC 29.8 (L) 30.0 - 36.0 g/dL   RDW 19.1 (H) 11.5 - 15.5 %   Platelets 566 (H) 150 - 400 K/uL   nRBC 0.0 0.0 - 0.2 %  Creatinine, serum     Status: None   Collection Time: 07/19/19  4:30 PM  Result Value Ref Range   Creatinine, Ser 0.69 0.44 - 1.00 mg/dL   GFR calc non Af Amer >60 >60 mL/min   GFR calc Af Amer >60 >60 mL/min  Urinalysis, Routine w reflex microscopic     Status: Abnormal   Collection Time: 07/20/19  2:17 AM  Result Value Ref Range   Color, Urine STRAW (A) YELLOW   APPearance CLEAR CLEAR   Specific Gravity, Urine 1.009 1.005 - 1.030   pH 6.0 5.0 - 8.0   Glucose, UA NEGATIVE NEGATIVE mg/dL   Hgb urine dipstick NEGATIVE NEGATIVE   Bilirubin Urine NEGATIVE NEGATIVE   Ketones, ur NEGATIVE NEGATIVE mg/dL   Protein, ur NEGATIVE   NEGATIVE mg/dL   Nitrite NEGATIVE NEGATIVE   Leukocytes,Ua NEGATIVE NEGATIVE  CBC     Status: Abnormal   Collection Time: 07/20/19  5:41 AM  Result Value Ref Range   WBC 10.6 (H) 4.0 - 10.5 K/uL   RBC 3.24 (L) 3.87 - 5.11 MIL/uL   Hemoglobin 8.7 (L) 12.0 - 15.0 g/dL   HCT 29.2 (L) 36.0 - 46.0 %   MCV 90.1 80.0 - 100.0 fL   MCH 26.9 26.0 - 34.0 pg   MCHC 29.8 (L) 30.0 - 36.0 g/dL   RDW 18.7 (H) 11.5 - 15.5 %   Platelets 562 (H) 150 - 400 K/uL   nRBC 0.0 0.0 - 0.2 %  Comprehensive metabolic panel     Status: Abnormal   Collection Time: 07/20/19  5:41 AM  Result Value Ref Range   Sodium 139 135 - 145 mmol/L   Potassium 3.8 3.5 - 5.1 mmol/L   Chloride 99 98 - 111 mmol/L   CO2 28 22 - 32 mmol/L   Glucose, Bld 105 (H) 70 - 99 mg/dL   BUN 6 6 - 20 mg/dL   Creatinine, Ser 0.75 0.44 - 1.00 mg/dL   Calcium 8.6 (L) 8.9 - 10.3 mg/dL   Total Protein 6.0 (L) 6.5 - 8.1 g/dL   Albumin 2.6 (L) 3.5 - 5.0 g/dL   AST 18 15 - 41 U/L   ALT 19 0 - 44 U/L   Alkaline Phosphatase 79 38 - 126 U/L   Total Bilirubin 0.4 0.3 - 1.2 mg/dL   GFR calc non Af Amer >60 >60 mL/min   GFR calc Af Amer >60 >60 mL/min   Anion gap 12 5 - 15      PHYSICAL EXAM:   Gen: Sitting up in bedside chair, awake and alert, no acute distress, very pleasant.  She is also quite appreciative of the care that she has received Lungs: Breathing is unlabored.  No increased work of breathing, symmetric rise and fall of her chest Cardiac: Regular IPJ:ASNK Ext:       Left lower extremity  Short leg splint is clean, dry and intact and is fitting well  Swelling is stable  DPN, SPN, TN sensory functions intact  EHL, FHL, lesser toe motor functions intact  No pain out of proportion with passive stretching  Extremity is warm  + DP pulse  Good perfusion distally  Assessment/Plan: 1 Day Post-Op   Principal Problem:   Closed fracture of left distal tibia Active Problems:   GERD (gastroesophageal reflux disease)   HTN (hypertension)   Prediabetes   Neuropathy   Fibromyalgia   Depression   Carotid stenosis   Bipolar disorder (HCC)   Anxiety   Asthma   HLD (hyperlipidemia)   Seizures (HCC)   Anti-infectives (From admission, onward)   Start     Dose/Rate Route Frequency Ordered Stop   07/19/19 1400  ceFAZolin (ANCEF) IVPB 1 g/50 mL premix     1 g 100 mL/hr over 30 Minutes Intravenous Every 6 hours 07/19/19 1256 07/20/19 0228   07/19/19 0600  ceFAZolin (ANCEF) IVPB 2g/100 mL premix     2 g 200 mL/hr over 30 Minutes Intravenous On call to O.R. 07/19/19 5397 07/19/19 0825    .  POD/HD#: 45  44 year old female with numerous medical comorbid conditions with closed low energy left distal tibia and fibula fracture approximately 2 weeks ago  -Fall  -Closed left distal tibia and fibula fracture s/p ORIF  Nonweightbearing x 8 weeks  Splint x2 weeks and will begin gentle range of motion of her ankle  PT and OT prior to discharge   Unable to arrange home health PT due to location of residence as well as payer status per case management.  We will refer to outpatient therapy at her first follow-up.  We will remove her splint in 2 weeks at  the office and then place her into a cam boot  Keep splint clean and dry until that time  Ice and elevate for swelling and pain control  Toe motion as tolerated, knee motion as tolerated   - Pain management:  Patient is on various medications prior to admission including benzos, trazodone and some other psychotropic medications.  We will send her home with a prescription for Percocet.  No additional medications will be provided for by our office.  In fact I do think adding any additional medications could be further deleterious  - ABL anemia/Hemodynamics  Stable - Medical issues   Home meds - DVT/PE prophylaxis:  Lovenox x21 days, 40 mg subcu injection daily - ID:   Perioperative antibiotics completed  - Metabolic Bone Disease:             Vitamin D deficiency detected   Supplement             Clinically pretty poor bone quality             Chronic meds and comorbid conditions place her at increased risk for poor bone quality and healing compromise             Given low-energy mechanism would recommend DEXA scan in 4 to 8 weeks   - FEN/GI prophylaxis/Foley/Lines:            Regular diet  Discontinue IV and IV fluids  - Impediments to fracture healing:             Many!!                         Low energy fracture                         History of nicotine use                                      Nicotine test is pending                         Myriad of medications which increase her risk of falls as well as direct inhibition of osteoblast function   - Dispo:             Patient is stable for discharge today  Follow-up with orthopedics in 2 weeks    Jari Pigg, PA-C [phone] (C) 07/20/2019, 10:31 AM  [facility] [place] [id number] [phone] Domingo Sep (F)

## 2019-07-20 NOTE — Progress Notes (Signed)
Physical Therapy Treatment Patient Details Name: Lauren Cross MRN: [id number] DOB: Sep 13, 1975 Today's Date: 07/20/2019    History of Present Illness Pt is a 44 y/o female s/p ORIF L tib-fib fx secondary to ground-level fall with complex left distal tibia and fibula fracture. Of note, pt initially admitted on 07/04/19 to a hospital in [place] for the fall which was found to be related to her having a stroke. PMH including but not limited to HLD, leukemia, ?MS (work-up in progress), seizures.     PT Comments    Pt is making progress with functional mobility. Pt able to transfer sit<>stand with RW and min guard for safety, requiring VC for appropriate safety and sequencing with RW. Pt able to ambulate ~25' via R LE 'hopping' with RW and min guard. Today's session limited by pain and nausea- RN notified and provided meds. Plan for second session today to address stairs and progress mobility with controlled pain and nausea. Pt would continue to benefit from skilled physical therapy services at this time while admitted and after d/c to address the below listed limitations in order to improve overall safety and independence with functional mobility.   Follow Up Recommendations  Home health PT;Supervision for mobility/OOB     Equipment Recommendations  3in1 (PT)    Recommendations for Other Services       Precautions / Restrictions Precautions Precautions: Fall Restrictions Weight Bearing Restrictions: Yes LLE Weight Bearing: Non weight bearing    Mobility  Bed Mobility Overal bed mobility: Modified Independent Bed Mobility: Sit to Supine     Supine to sit: Min guard Sit to supine: Modified independent (Device/Increase time)   General bed mobility comments: Pt in recliner upon arrival  Transfers Overall transfer level: Needs assistance Equipment used: Rolling walker (2 wheeled) Transfers: Sit to/from Stand Sit to Stand: Supervision         General transfer comment:  Supervision for safety, no physical assist. VC for safety and sequecning w/RW  Ambulation/Gait Ambulation/Gait assistance: Supervision Gait Distance (Feet): 15 Feet Assistive device: Rolling walker (2 wheeled) Gait Pattern/deviations: (hop-to R LE) Gait velocity: dec   General Gait Details: Supervision for safety. VC for RW safety   Stairs Stairs: Yes Stairs assistance: Min assist;Min guard Stair Management: Two rails;Forwards(hop on R LE) Number of Stairs: 2 General stair comments: min A for initial hop onto stair, then min guard for safety. Demonstration prior and VC for proper sequencing. PT able to maintain NWB status on L LE. Demonstrated alternative stair navigation backward w/RW and on buttocks.    Wheelchair Mobility    Modified Rankin (Stroke Patients Only)       Balance Overall balance assessment: Needs assistance Sitting-balance support: Feet supported Sitting balance-Leahy Scale: Good     Standing balance support: Bilateral upper extremity supported;During functional activity Standing balance-Leahy Scale: Poor Standing balance comment: Reliant on UE support                            Cognition Arousal/Alertness: Awake/alert Behavior During Therapy: WFL for tasks assessed/performed Overall Cognitive Status: Within Functional Limits for tasks assessed                                        Exercises General Exercises - Lower Extremity Ankle Circles/Pumps: AROM;Strengthening;Left;5 reps;Seated    General Comments General comments (skin integrity, edema, etc.): Pt in  pain and nauseous- RN notified      Pertinent Vitals/Pain Pain Assessment: 0-10 Pain Score: 8  Faces Pain Scale: Hurts whole lot Pain Location: L ankle Pain Descriptors / Indicators: Aching;Discomfort Pain Intervention(s): RN gave pain meds during session;Monitored during session    Home Living Family/patient expects to be discharged to:: Private  residence Living Arrangements: Parent;Children Available Help at Discharge: Family Type of Home: House Home Access: Stairs to enter Entrance Stairs-Rails: Right;Left Home Layout: One level Home Equipment: Clinical cytogeneticist - 2 wheels Additional Comments: Pt reporting  she has a 3N1 from her grandfather.    Prior Function Level of Independence: Independent          PT Goals (current goals can now be found in the care plan section) Acute Rehab PT Goals Patient Stated Goal: to go home PT Goal Formulation: With patient Time For Goal Achievement: 08/02/19 Potential to Achieve Goals: Good Progress towards PT goals: Progressing toward goals    Frequency    Min 3X/week      PT Plan Current plan remains appropriate    Co-evaluation              AM-PAC PT "6 Clicks" Mobility   Outcome Measure  Help needed turning from your back to your side while in a flat bed without using bedrails?: None Help needed moving from lying on your back to sitting on the side of a flat bed without using bedrails?: None Help needed moving to and from a bed to a chair (including a wheelchair)?: None Help needed standing up from a chair using your arms (e.g., wheelchair or bedside chair)?: A Little Help needed to walk in hospital room?: None Help needed climbing 3-5 steps with a railing? : A Lot 6 Click Score: 21    End of Session Equipment Utilized During Treatment: Gait belt Activity Tolerance: Patient tolerated treatment well Patient left: in bed;with call bell/phone within reach Nurse Communication: Mobility status PT Visit Diagnosis: Other abnormalities of gait and mobility (R26.89)     Time: [id number] PT Time Calculation (min) (ACUTE ONLY): 22 min  Charges:  $Gait Training: 8-22 mins                     Lauren Cross, SPT Acute Rehab  [id number]    Jim Philemon 07/20/2019, 12:42 PM

## 2019-07-20 NOTE — Progress Notes (Signed)
PT Progress Note for Charges    07/20/19 1059  PT Visit Information  Last PT Received On 07/20/19  PT General Charges  $$ ACUTE PT VISIT 1 Visit  PT Treatments  $Gait Training 8-22 mins  Anastasio Champion, DPT  Acute Rehabilitation Services Pager [phone] Office [phone]

## 2019-07-20 NOTE — Discharge Instructions (Signed)
Orthopaedic Trauma Service Discharge Instructions   General Discharge Instructions  Orthopaedic Injuries:  Left tibia and fibula fracture treated with open reduction and internal fixation using plate and screws  WEIGHT BEARING STATUS: Nonweightbearing left leg.  Use walker to help maintain nonweightbearing on left leg.  RANGE OF MOTION/ACTIVITY: No ankle range of motion at this time as you are in a splint.  It is okay to move your toes and knee.  Activity as tolerated while maintaining weightbearing restrictions  Bone health: Labs show that you have vitamin D deficiency.  Continue to take vitamin D and vitamin C.  We can recheck your labs in 4 to 8 weeks.  We also have several medical conditions that are associated with poor bone quality.  We would strongly recommend a bone density scan in 4 to 8 weeks as well to further evaluate your bone health  Wound Care: Keep splint clean and dry.  Do not remove.  Do not get wet.  We will remove it at your first office visit.  Please bring the black cam boot with you to your first postoperative visit  DVT/PE prophylaxis: Lovenox 40 mg subcutaneous injection daily x 21 days  Diet: as you were eating previously.  Can use over the counter stool softeners and bowel preparations, such as Miralax, to help with bowel movements.  Narcotics can be constipating.  Be sure to drink plenty of fluids  PAIN MEDICATION USE AND EXPECTATIONS  You have likely been given narcotic medications to help control your pain.  After a traumatic event that results in an fracture (broken bone) with or without surgery, it is ok to use narcotic pain medications to help control one's pain.  We understand that everyone responds to pain differently and each individual patient will be evaluated on a regular basis for the continued need for narcotic medications. Ideally, narcotic medication use should last no more than 6-8 weeks (coinciding with fracture healing).   As a patient it is  your responsibility as well to monitor narcotic medication use and report the amount and frequency you use these medications when you come to your office visit.   We would also advise that if you are using narcotic medications, you should take a dose prior to therapy to maximize you participation.  IF YOU ARE ON NARCOTIC MEDICATIONS IT IS NOT PERMISSIBLE TO OPERATE A MOTOR VEHICLE (MOTORCYCLE/CAR/TRUCK/MOPED) OR HEAVY MACHINERY DO NOT MIX NARCOTICS WITH OTHER CNS (CENTRAL NERVOUS SYSTEM) DEPRESSANTS SUCH AS ALCOHOL   STOP SMOKING OR USING NICOTINE PRODUCTS!!!!  As discussed nicotine severely impairs your body's ability to heal surgical and traumatic wounds but also impairs bone healing.  Wounds and bone heal by forming microscopic blood vessels (angiogenesis) and nicotine is a vasoconstrictor (essentially, shrinks blood vessels).  Therefore, if vasoconstriction occurs to these microscopic blood vessels they essentially disappear and are unable to deliver necessary nutrients to the healing tissue.  This is one modifiable factor that you can do to dramatically increase your chances of healing your injury.    (This means no smoking, no nicotine gum, patches, etc)  DO NOT USE NONSTEROIDAL ANTI-INFLAMMATORY DRUGS (NSAID'S)  Using products such as Advil (ibuprofen), Aleve (naproxen), Motrin (ibuprofen) for additional pain control during fracture healing can delay and/or prevent the healing response.  If you would like to take over the counter (OTC) medication, Tylenol (acetaminophen) is ok.  However, some narcotic medications that are given for pain control contain acetaminophen as well. Therefore, you should not exceed more than  4000 mg of tylenol in a day if you do not have liver disease.  Also note that there are may OTC medicines, such as cold medicines and allergy medicines that my contain tylenol as well.  If you have any questions about medications and/or interactions please ask your doctor/PA or your  pharmacist.      ICE AND ELEVATE INJURED/OPERATIVE EXTREMITY  Using ice and elevating the injured extremity above your heart can help with swelling and pain control.  Icing in a pulsatile fashion, such as 20 minutes on and 20 minutes off, can be followed.    Do not place ice directly on skin. Make sure there is a barrier between to skin and the ice pack.    Using frozen items such as frozen peas works well as the conform nicely to the are that needs to be iced.  USE AN ACE WRAP OR TED HOSE FOR SWELLING CONTROL  In addition to icing and elevation, Ace wraps or TED hose are used to help limit and resolve swelling.  It is recommended to use Ace wraps or TED hose until you are informed to stop.    When using Ace Wraps start the wrapping distally (farthest away from the body) and wrap proximally (closer to the body)   Example: If you had surgery on your leg or thing and you do not have a splint on, start the ace wrap at the toes and work your way up to the thigh        If you had surgery on your upper extremity and do not have a splint on, start the ace wrap at your fingers and work your way up to the upper arm  IF YOU ARE IN A SPLINT OR CAST DO NOT [place]   If your splint gets wet for any reason please contact the office immediately. You may shower in your splint or cast as long as you keep it dry.  This can be done by wrapping in a cast cover or garbage back (or similar)  Do Not stick any thing down your splint or cast such as pencils, money, or hangers to try and scratch yourself with.  If you feel itchy take benadryl as prescribed on the bottle for itching  IF YOU ARE IN A CAM BOOT (BLACK BOOT)  You may remove boot periodically. Perform daily dressing changes as noted below.  Wash the liner of the boot regularly and wear a sock when wearing the boot. It is recommended that you sleep in the boot until told otherwise    Call office for the following:  Temperature greater than  101F  Persistent nausea and vomiting  Severe uncontrolled pain  Redness, tenderness, or signs of infection (pain, swelling, redness, odor or green/yellow discharge around the site)  Difficulty breathing, headache or visual disturbances  Hives  Persistent dizziness or light-headedness  Extreme fatigue  Any other questions or concerns you may have after discharge  In an emergency, call 911 or go to an Emergency Department at a nearby hospital    [place]: [phone]   VISIT OUR WEBSITE FOR ADDITIONAL INFORMATION: [email or web address]      Cast or Splint Care, Adult Casts and splints are supports that are worn to protect broken bones and other injuries. A cast or splint may hold a bone still and in the correct position while it heals. Casts and splints may also help to ease pain, swelling, and muscle  spasms. How to care for your cast   Do not stick anything inside the cast to scratch your skin.  Check the skin around the cast every day. Tell your doctor about any concerns.  You may put lotion on dry skin around the edges of the cast. Do not put lotion on the skin under the cast.  Keep the cast clean.  If the cast is not waterproof: ? Do not let it get wet. ? Cover it with a watertight covering when you take a bath or a shower. How to care for your splint   Wear it as told by your doctor. Take it off only as told by your doctor.  Loosen the splint if your fingers or toes tingle, get numb, or turn cold and blue.  Keep the splint clean.  If the splint is not waterproof: ? Do not let it get wet. ? Cover it with a watertight covering when you take a bath or a shower. Follow these instructions at home: Bathing  Do not take baths or swim until your doctor says it is okay. Ask your doctor if you can take showers. You may only be allowed to take sponge baths for bathing.  If your cast or splint is not waterproof, cover it with a  watertight covering when you take a bath or shower. Managing pain, stiffness, and swelling  Move your fingers or toes often to avoid stiffness and to lessen swelling.  Raise (elevate) the injured area above the level of your heart while sitting or lying down. Safety  Do not use the injured limb to support your body weight until your doctor says that it is okay.  Use crutches or other assistive devices as told by your doctor. General instructions  Do not put pressure on any part of the cast or splint until it is fully hardened. This may take many hours.  Return to your normal activities as told by your doctor. Ask your doctor what activities are safe for you.  Keep all follow-up visits as told by your doctor. This is important. Contact a doctor if:  Your cast or splint gets damaged.  The skin around the cast gets red or raw.  The skin under the cast is very itchy or painful.  Your cast or splint feels very uncomfortable.  Your cast or splint is too tight or too loose.  Your cast becomes wet or it starts to have a soft spot or area.  You get an object stuck under your cast. Get help right away if:  Your pain gets worse.  The injured area tingles, gets numb, or turns blue and cold.  The part of your body above or below the cast is swollen and it turns a different color (is discolored).  You cannot feel or move your fingers or toes.  There is fluid leaking through the cast.  You have very bad pain or pressure under the cast.  You have trouble breathing.  You have shortness of breath.  You have chest pain. This information is not intended to replace advice given to you by your health care provider. Make sure you discuss any questions you have with your health care provider. Document Revised: 07/26/2018 Document Reviewed: 03/26/2016 Elsevier Patient Education  [place].

## 2019-07-20 NOTE — Plan of Care (Signed)
  Problem: Education: Goal: Knowledge of General Education information will improve Description: Including pain rating scale, medication(s)/side effects and non-pharmacologic comfort measures Outcome: Progressing   Problem: Clinical Measurements: Goal: Ability to maintain clinical measurements within normal limits will improve Outcome: Progressing Goal: Diagnostic test results will improve Outcome: Progressing   Problem: Activity: Goal: Risk for activity intolerance will decrease Outcome: Progressing   Problem: Pain Managment: Goal: General experience of comfort will improve Outcome: Progressing

## 2019-07-20 NOTE — Evaluation (Signed)
Occupational Therapy Evaluation Patient Details Name: Lauren Cross MRN: [id number] DOB: 12/30/1975 Today's Date: 07/20/2019    History of Present Illness Pt is a 44 y/o female s/p ORIF L tib-fib fx secondary to ground-level fall with complex left distal tibia and fibula fracture. Of note, pt initially admitted on 07/04/19 to a hospital in [place] for the fall which was found to be related to her having a stroke. PMH including but not limited to HLD, leukemia, ?MS (work-up in progress), seizures.    Clinical Impression   PTA, pt was living with her mother and 10 yo daughter and was independent. Pt currently performing ADLs and functional mobility with Supervision level. Educating pt on compensatory techniques for LB ADLs, toileting, and shower transfer with 3N1 facing forward. Pt verbalized and demonstrated understanding. Answered all pt questions. Recommend dc home once medically stable per physician. All acute OT needs met and will sign off. Thank you.    Follow Up Recommendations  No OT follow up    Equipment Recommendations  Other (comment)(Pt reporting she has access to 3N1)    Recommendations for Other Services PT consult     Precautions / Restrictions Precautions Precautions: Fall Restrictions Weight Bearing Restrictions: Yes LLE Weight Bearing: Non weight bearing      Mobility Bed Mobility Overal bed mobility: Needs Assistance Bed Mobility: Supine to Sit     Supine to sit: Min guard     General bed mobility comments: Pt in recliner upon arrival  Transfers Overall transfer level: Needs assistance Equipment used: Rolling walker (2 wheeled) Transfers: Sit to/from Stand Sit to Stand: Supervision         General transfer comment: Cues for hand placement. Close supervision for safety    Balance Overall balance assessment: Needs assistance Sitting-balance support: Feet supported Sitting balance-Leahy Scale: Fair     Standing balance support: Bilateral upper  extremity supported;During functional activity Standing balance-Leahy Scale: Poor Standing balance comment: Reliant on UE support                           ADL either performed or assessed with clinical judgement   ADL Overall ADL's : Needs assistance/impaired                                       General ADL Comments: Pt performing ADLs and functional mobility at Supervision level. Providing pt with education on compensatory techniques for LB ADLs, toileting with 3N1 over toilet, and use of 3N1 for shower seat facing outward     Vision Baseline Vision/History: Wears glasses Patient Visual Report: No change from baseline       Perception     Praxis      Pertinent Vitals/Pain Pain Assessment: Faces Faces Pain Scale: Hurts whole lot Pain Location: L ankle Pain Descriptors / Indicators: Aching;Discomfort;Other (Comment)("feels like its being sawed off") Pain Intervention(s): Limited activity within patient's tolerance;Monitored during session;Repositioned     Hand Dominance Right   Extremity/Trunk Assessment Upper Extremity Assessment Upper Extremity Assessment: Overall WFL for tasks assessed   Lower Extremity Assessment Lower Extremity Assessment: LLE deficits/detail;Defer to PT evaluation LLE Deficits / Details: pt with decreased strength and ROM limitations secondary to post-op pain and weakness. Pt with no sensation to toes LLE: Unable to fully assess due to pain;Unable to fully assess due to immobilization   Cervical / Trunk Assessment Cervical /   Trunk Assessment: Normal   Communication Communication Communication: No difficulties   Cognition Arousal/Alertness: Awake/alert Behavior During Therapy: WFL for tasks assessed/performed Overall Cognitive Status: Within Functional Limits for tasks assessed                                     General Comments  Pt in pain and nauseous- RN notified    Exercises Exercises:  General Lower Extremity General Exercises - Lower Extremity Ankle Circles/Pumps: AROM;Strengthening;Left;5 reps;Seated   Shoulder Instructions      Home Living Family/patient expects to be discharged to:: Private residence Living Arrangements: Parent;Children Available Help at Discharge: Family Type of Home: House Home Access: Stairs to enter [employer] of Steps: 3 Entrance Stairs-Rails: Right;Left Home Layout: One level     Bathroom Shower/Tub: Tub/shower unit;Walk-in shower   Bathroom Toilet: Standard     Home Equipment: Clinical cytogeneticist - 2 wheels   Additional Comments: Pt reporting  she has a 3N1 from her grandfather.      Prior Functioning/Environment Level of Independence: Independent                 OT Problem List: Decreased activity tolerance;Impaired balance (sitting and/or standing);Decreased knowledge of use of DME or AE;Decreased knowledge of precautions;Pain      OT Treatment/Interventions:      OT Goals(Current goals can be found in the care plan section) Acute Rehab OT Goals Patient Stated Goal: to go home OT Goal Formulation: All assessment and education complete, DC therapy  OT Frequency:     Barriers to D/C:            Co-evaluation              AM-PAC OT "6 Clicks" Daily Activity     Outcome Measure Help from another person eating meals?: None Help from another person taking care of personal grooming?: None Help from another person toileting, which includes using toliet, bedpan, or urinal?: None Help from another person bathing (including washing, rinsing, drying)?: None Help from another person to put on and taking off regular upper body clothing?: None Help from another person to put on and taking off regular lower body clothing?: None 6 Click Score: 24   End of Session Equipment Utilized During Treatment: Rolling walker Nurse Communication: Mobility status  Activity Tolerance: Patient tolerated treatment  well Patient left: in chair;with call bell/phone within reach  OT Visit Diagnosis: Unsteadiness on feet (R26.81);Other abnormalities of gait and mobility (R26.89);Muscle weakness (generalized) (M62.81);Pain Pain - Right/Left: Left Pain - part of body: Leg                Time: 2703-5009 OT Time Calculation (min): 15 min Charges:  OT General Charges $OT Visit: 1 Visit OT Evaluation $OT Eval Low Complexity: [place], OTR/L Acute Rehab Pager: [phone] Office: [place] 07/20/2019, 12:04 PM

## 2019-07-20 NOTE — Progress Notes (Signed)
PT Progress Note for Charges    07/20/19 1300  PT Visit Information  Last PT Received On 07/20/19  PT General Charges  $$ ACUTE PT VISIT 1 Visit  PT Treatments  $Gait Training 8-22 mins  Anastasio Champion, DPT  Acute Rehabilitation Services Pager [phone] Office [phone]

## 2019-07-20 NOTE — Progress Notes (Signed)
Physical Therapy Treatment Patient Details Name: Lauren Cross MRN: [id number] DOB: July 12, 1975 Today's Date: 07/20/2019    History of Present Illness Pt is a 44 y/o female s/p ORIF L tib-fib fx secondary to ground-level fall with complex left distal tibia and fibula fracture. Of note, pt initially admitted on 07/04/19 to a hospital in [place] for the fall which was found to be related to her having a stroke. PMH including but not limited to HLD, leukemia, ?MS (work-up in progress), seizures.     PT Comments    Patient is making good progress with functional mobility and tolerated session well. She requires supervision for transfers and ambulation for safety, with occasional cues for RW sequencing. She was able to navigate 2 steps with heavy reliance on UE and Bil rails. Initially minA on stairs for confidence and initial hop with R LE onto stair, then min guard for safety. PT demonstrated and educated pt on alternate stair navigation techniques; backward with RW and family assistance, and scooting on buttocks with assistance. Pt is ready for d/c from PT perspective, recommend d/c home with family support and HH PT.  Pt would continue to benefit from skilled physical therapy services at this time while admitted and after d/c to address the below listed limitations in order to improve overall safety and independence with functional mobility.   Follow Up Recommendations  Home health PT;Supervision for mobility/OOB     Equipment Recommendations  3in1 (PT)    Recommendations for Other Services       Precautions / Restrictions Precautions Precautions: Fall Restrictions Weight Bearing Restrictions: Yes LLE Weight Bearing: Non weight bearing    Mobility  Bed Mobility Overal bed mobility: Modified Independent Bed Mobility: Sit to Supine      Sit to supine: Modified independent (Device/Increase time)    Transfers Overall transfer level: Needs assistance Equipment used: Rolling walker  (2 wheeled) Transfers: Sit to/from Stand Sit to Stand: Supervision         General transfer comment: Supervision for safety, no physical assist. VC for safety and sequecning w/RW  Ambulation/Gait Ambulation/Gait assistance: Supervision Gait Distance (Feet): 15 Feet Assistive device: Rolling walker (2 wheeled) Gait Pattern/deviations: (hop-to R LE) Gait velocity: dec   General Gait Details: Supervision for safety. VC for RW safety   Stairs Stairs: Yes Stairs assistance: Min assist;Min guard Stair Management: Two rails;Forwards(hop on R LE) Number of Stairs: 2 General stair comments: min A for initial hop onto stair, then min guard for safety. Demonstration prior and VC for proper sequencing. PT able to maintain NWB status on L LE. Demonstrated alternative stair navigation backward w/RW and on buttocks.    Wheelchair Mobility    Modified Rankin (Stroke Patients Only)       Balance Overall balance assessment: Needs assistance Sitting-balance support: Feet supported Sitting balance-Leahy Scale: Good     Standing balance support: Bilateral upper extremity supported;During functional activity Standing balance-Leahy Scale: Poor Standing balance comment: Reliant on UE support                            Cognition Arousal/Alertness: Awake/alert Behavior During Therapy: WFL for tasks assessed/performed Overall Cognitive Status: Within Functional Limits for tasks assessed                                           General Comments  General comments (skin integrity, edema, etc.): Pt in pain and nauseous- RN notified      Pertinent Vitals/Pain Pain Assessment: 0-10 Pain Score: 8  Faces Pain Scale: Hurts whole lot Pain Location: L ankle Pain Descriptors / Indicators: Aching;Discomfort Pain Intervention(s): RN gave pain meds during session;Monitored during session    Home Living Family/patient expects to be discharged to:: Private  residence Living Arrangements: Parent;Children Available Help at Discharge: Family Type of Home: House Home Access: Stairs to enter Entrance Stairs-Rails: Right;Left Home Layout: One level Home Equipment: Clinical cytogeneticist - 2 wheels Additional Comments: Pt reporting  she has a 3N1 from her grandfather.    Prior Function Level of Independence: Independent          PT Goals (current goals can now be found in the care plan section) Acute Rehab PT Goals Patient Stated Goal: to go home PT Goal Formulation: With patient Time For Goal Achievement: 08/02/19 Potential to Achieve Goals: Good Progress towards PT goals: Progressing toward goals    Frequency    Min 3X/week      PT Plan Current plan remains appropriate    Co-evaluation              AM-PAC PT "6 Clicks" Mobility   Outcome Measure  Help needed turning from your back to your side while in a flat bed without using bedrails?: None Help needed moving from lying on your back to sitting on the side of a flat bed without using bedrails?: None Help needed moving to and from a bed to a chair (including a wheelchair)?: None Help needed standing up from a chair using your arms (e.g., wheelchair or bedside chair)?: A Little Help needed to walk in hospital room?: None Help needed climbing 3-5 steps with a railing? : A Lot 6 Click Score: 21    End of Session Equipment Utilized During Treatment: Gait belt Activity Tolerance: Patient tolerated treatment well Patient left: in bed;with call bell/phone within reach Nurse Communication: Mobility status PT Visit Diagnosis: Other abnormalities of gait and mobility (R26.89)     Time: [id number] PT Time Calculation (min) (ACUTE ONLY): 22 min  Charges:  $Gait Training: 8-22 mins                    Donasia Wimes, SPT Acute Rehab  [id number]   Merril Isakson 07/20/2019, 12:43 PM

## 2019-08-01 LAB — NICOTINE/COTININE METABOLITES
Cotinine: 92.1 ng/mL
Nicotine: <1 ng/mL

## 2019-08-05 NOTE — Op Note (Signed)
07/19/2019  10:26 PM  PATIENT:  Lauren Cross  44 y.o. female  PRE-OPERATIVE DIAGNOSIS:  LEFT ANKLE PILON, TIBIA AND FIBULA  POST-OPERATIVE DIAGNOSIS:  LEFT ANKLE PILON, TIBIA AND FIBULA  PROCEDURE:  Procedure(s): 1. OPEN REDUCTION INTERAL FIXATION (ORIF) LEFT ANKLE PILON, TIBIA AND FIBULA 2. STRESS FLUOROSCOPY OF THE LEFT ANKLE  SURGEON:  Surgeon(s) and Role:    Altamese Riverton, MD - Primary  PHYSICIAN ASSISTANT: Ainsley Spinner, PA-C  ANESTHESIA:   general  I/O:  No intake/output data recorded.  SPECIMEN:  No Specimen  TOURNIQUET:  * Missing tourniquet times found for documented tourniquets in log: [id number] *  COMPLICATIONS: NONE  DICTATION: .Note written in EPIC  DISPOSITION: TO PACU  CONDITION: STABLE  DELAY START OF DVT PROPHYLAXIS BECAUSE OF BLEEDING RISK: NO  BRIEF SUMMARY AND INDICATION FOR PROCEDURE: Patient is a 44 y.o. who sustained a displaced and shortened pilon fracture in fall after a stroke.  The patient was initially seen and evaluated by Dr. Rodell Perna, who recognized the complexity of the injury and asserted this was outside his scope of practice and that this injury would be best managed by a fellowship trained orthopedic traumatologist. Consequently, we were consulted to evaluate the patient and provide appropriate treatment.  I discussed with the patient the risks and benefits of surgery, including the possibility of infection, nerve injury, vessel injury, wound breakdown, arthritis, symptomatic hardware, DVT/ PE, loss of motion, malunion, nonunion, and need for further surgery among others.  We also specifically discussed the potential need to stage surgery because of the elevated risk of soft tissue breakdown that could lead to amputation.  Furthermore, we addressed the syndesmosis ligament, which would be assessed following fracture repair. If unstable, this would require additional fixation and that some of this fixation may require later removal. These risks  were acknowledged and consent provided to proceed.   SUMMARY OF PROCEDURE:  The patient was taken to the operating room after administration of preoperative antibiotics.  General anesthesia was induced.  The left lower extremity was cleaned with chlorhexidine soap and scrub and then followed by Betadine scrub and paint.  I began with the fibula where a posterior incision was made, the dissection carried carefully down to the fibula.  The periosteum was left intact, but the fracture site was cleaned with curette and lavaged.  A Lobster claw in addition to traction and derotation by my assistant was used to restore length while the Lobster claw directly on the fracture site resulted in reduction.  This was checked on fluoro and then, a posterolateral buttress plate applied, securing proximal fixation first and then distal fixation using combination of both locked and Standard screws. Final images showed appropriate reduction, hardware placement on AP, mortise, and lateral views and Ainsley Spinner, PAC, did assist throughout.  We then performed a wound closure and re-evaluated the anterior soft tissues.   Because we still had wrinkling in the anticipated area of incision, we felt that it was most prudent to proceed with direct fixation using a limited dissection.  A medial incision was made giving [place] a large skin bridge.  A reduction maneuver with the large tenaculum was then performed of the lateral aspect of the plafond.  This was compressed with a medial to lateral lag screw placed through the plate to secure it. In the Biomet plate, I used standard conical screws followed by additional Locked screws.  C-arm was brought in.  My assistant, Ainsley Spinner, PAC, retracted the saphenous vessel  to protect it throughout and also helped to place provisional fixation and produced compression while it was tightening definitive fixation.  Once the C-arm was in place, AP, lateral, and mortise views were  obtained, and then, a stress view was performed in which we did not identify any lateral subluxation of the talus, syndesmotic widening, or increase in the medial clear space.  The wounds were irrigated thoroughly and closed in standard layered fashion using 2-0 Vicryl and 3-0 nylon.  Sterile gently compressive dressing was applied and then, a posterior and stirrup splint.  The patient was awakened from anesthesia and transported to PACU in stable condition.  Again, Ainsley Spinner, PAC, did assist me throughout.   PROGNOSIS: Patient is at increased risk for infection, nonunion, delayed union, and soft tissue complications because of her low bone quality for age. PT will assist with nonweightbearing for the next 6-8 weeks with protected graduated weightbearing thereafter and patient will resume her formal pharmacologic anticoagulant.   Altamese Thayer, MD [facility], [facility] [phone]

## 2019-09-25 ENCOUNTER — Other Ambulatory Visit: Payer: Self-pay | Admitting: Orthopedic Surgery

## 2019-09-25 DIAGNOSIS — E559 Vitamin D deficiency, unspecified: Secondary | ICD-10-CM

## 2019-10-19 ENCOUNTER — Other Ambulatory Visit: Payer: Medicaid Other

## 2019-12-27 DIAGNOSIS — M7550 Bursitis of unspecified shoulder: Secondary | ICD-10-CM | POA: Insufficient documentation

## 2019-12-27 DIAGNOSIS — I6529 Occlusion and stenosis of unspecified carotid artery: Secondary | ICD-10-CM | POA: Diagnosis present

## 2019-12-27 DIAGNOSIS — J45909 Unspecified asthma, uncomplicated: Secondary | ICD-10-CM | POA: Diagnosis present

## 2019-12-27 DIAGNOSIS — M25569 Pain in unspecified knee: Secondary | ICD-10-CM | POA: Insufficient documentation

## 2019-12-27 DIAGNOSIS — M25519 Pain in unspecified shoulder: Secondary | ICD-10-CM | POA: Insufficient documentation

## 2019-12-27 DIAGNOSIS — F4312 Post-traumatic stress disorder, chronic: Secondary | ICD-10-CM | POA: Insufficient documentation

## 2019-12-27 DIAGNOSIS — M797 Fibromyalgia: Secondary | ICD-10-CM | POA: Insufficient documentation

## 2020-02-15 ENCOUNTER — Ambulatory Visit: Payer: Medicaid Other | Admitting: Orthopaedic Surgery

## 2020-03-04 ENCOUNTER — Other Ambulatory Visit: Payer: Self-pay

## 2020-03-04 DIAGNOSIS — I6523 Occlusion and stenosis of bilateral carotid arteries: Secondary | ICD-10-CM

## 2020-03-31 ENCOUNTER — Ambulatory Visit (HOSPITAL_COMMUNITY): Payer: Medicaid Other

## 2020-04-07 ENCOUNTER — Encounter: Payer: Medicaid Other | Admitting: Vascular Surgery

## 2020-04-14 ENCOUNTER — Other Ambulatory Visit: Payer: Self-pay

## 2020-04-14 ENCOUNTER — Emergency Department (HOSPITAL_COMMUNITY): Payer: Medicaid Other

## 2020-04-14 ENCOUNTER — Emergency Department (EMERGENCY_DEPARTMENT_HOSPITAL)
Admission: EM | Admit: 2020-04-14 | Discharge: 2020-04-15 | Disposition: A | Discharge status: Other Acute Inpatient Hospital | Payer: Medicaid Other | Source: Home / Self Care | Attending: Emergency Medicine | Admitting: Emergency Medicine

## 2020-04-14 ENCOUNTER — Encounter (HOSPITAL_COMMUNITY): Payer: Self-pay | Admitting: Emergency Medicine

## 2020-04-14 DIAGNOSIS — T50902A Poisoning by unspecified drugs, medicaments and biological substances, intentional self-harm, initial encounter: Secondary | ICD-10-CM

## 2020-04-14 DIAGNOSIS — Z20822 Contact with and (suspected) exposure to covid-19: Secondary | ICD-10-CM | POA: Insufficient documentation

## 2020-04-14 DIAGNOSIS — Z7901 Long term (current) use of anticoagulants: Secondary | ICD-10-CM | POA: Insufficient documentation

## 2020-04-14 DIAGNOSIS — F332 Major depressive disorder, recurrent severe without psychotic features: Secondary | ICD-10-CM | POA: Diagnosis not present

## 2020-04-14 DIAGNOSIS — S60229A Contusion of unspecified hand, initial encounter: Secondary | ICD-10-CM | POA: Insufficient documentation

## 2020-04-14 DIAGNOSIS — J45909 Unspecified asthma, uncomplicated: Secondary | ICD-10-CM | POA: Insufficient documentation

## 2020-04-14 DIAGNOSIS — Z87891 Personal history of nicotine dependence: Secondary | ICD-10-CM | POA: Insufficient documentation

## 2020-04-14 DIAGNOSIS — X58XXXA Exposure to other specified factors, initial encounter: Secondary | ICD-10-CM | POA: Insufficient documentation

## 2020-04-14 DIAGNOSIS — R7303 Prediabetes: Secondary | ICD-10-CM | POA: Insufficient documentation

## 2020-04-14 DIAGNOSIS — M545 Low back pain, unspecified: Secondary | ICD-10-CM

## 2020-04-14 DIAGNOSIS — Z7984 Long term (current) use of oral hypoglycemic drugs: Secondary | ICD-10-CM | POA: Insufficient documentation

## 2020-04-14 DIAGNOSIS — N3000 Acute cystitis without hematuria: Secondary | ICD-10-CM

## 2020-04-14 DIAGNOSIS — Z7982 Long term (current) use of aspirin: Secondary | ICD-10-CM | POA: Insufficient documentation

## 2020-04-14 DIAGNOSIS — I10 Essential (primary) hypertension: Secondary | ICD-10-CM | POA: Insufficient documentation

## 2020-04-14 LAB — CBC WITH DIFFERENTIAL/PLATELET
Abs Immature Granulocytes: 0.1 10*3/uL — ABNORMAL HIGH (ref 0.00–0.07)
Basophils Absolute: 0.1 10*3/uL (ref 0.0–0.1)
Basophils Relative: 1 %
Eosinophils Absolute: 0.5 10*3/uL (ref 0.0–0.5)
Eosinophils Relative: 3 %
HCT: 38.2 % (ref 36.0–46.0)
Hemoglobin: 12.2 g/dL (ref 12.0–15.0)
Immature Granulocytes: 1 %
Lymphocytes Relative: 24 %
Lymphs Abs: 3.4 10*3/uL (ref 0.7–4.0)
MCH: 28.9 pg (ref 26.0–34.0)
MCHC: 31.9 g/dL (ref 30.0–36.0)
MCV: 90.5 fL (ref 80.0–100.0)
Monocytes Absolute: 0.8 10*3/uL (ref 0.1–1.0)
Monocytes Relative: 6 %
Neutro Abs: 9.1 10*3/uL — ABNORMAL HIGH (ref 1.7–7.7)
Neutrophils Relative %: 65 %
Platelets: 445 10*3/uL — ABNORMAL HIGH (ref 150–400)
RBC: 4.22 MIL/uL (ref 3.87–5.11)
RDW: 16.8 % — ABNORMAL HIGH (ref 11.5–15.5)
WBC: 13.9 10*3/uL — ABNORMAL HIGH (ref 4.0–10.5)
nRBC: 0 % (ref 0.0–0.2)

## 2020-04-14 LAB — COMPREHENSIVE METABOLIC PANEL
ALT: 16 U/L (ref 0–44)
AST: 17 U/L (ref 15–41)
Albumin: 3.7 g/dL (ref 3.5–5.0)
Alkaline Phosphatase: 73 U/L (ref 38–126)
Anion gap: 11 (ref 5–15)
BUN: 6 mg/dL (ref 6–20)
CO2: 24 mmol/L (ref 22–32)
Calcium: 8.8 mg/dL — ABNORMAL LOW (ref 8.9–10.3)
Chloride: 103 mmol/L (ref 98–111)
Creatinine, Ser: 0.71 mg/dL (ref 0.44–1.00)
GFR, Estimated: >60 mL/min (ref >60)
Glucose, Bld: 80 mg/dL (ref 70–99)
Potassium: 3.1 mmol/L — ABNORMAL LOW (ref 3.5–5.1)
Sodium: 138 mmol/L (ref 135–145)
Total Bilirubin: 0.6 mg/dL (ref 0.3–1.2)
Total Protein: 7.2 g/dL (ref 6.5–8.1)

## 2020-04-14 LAB — SALICYLATE LEVEL: Salicylate Lvl: <7 mg/dL — ABNORMAL LOW (ref 7.0–30.0)

## 2020-04-14 LAB — RAPID URINE DRUG SCREEN, HOSP PERFORMED
Amphetamines: NOT DETECTED
Barbiturates: NOT DETECTED
Benzodiazepines: POSITIVE — AB
Cocaine: NOT DETECTED
Opiates: NOT DETECTED
Tetrahydrocannabinol: NOT DETECTED

## 2020-04-14 LAB — ETHANOL: Alcohol, Ethyl (B): <10 mg/dL (ref <10)

## 2020-04-14 LAB — RESP PANEL BY RT-PCR (FLU A&B, COVID) ARPGX2
Influenza A by PCR: NEGATIVE
Influenza B by PCR: NEGATIVE
SARS Coronavirus 2 by RT PCR: NEGATIVE

## 2020-04-14 LAB — POC URINE PREG, ED: Preg Test, Ur: NEGATIVE

## 2020-04-14 LAB — ACETAMINOPHEN LEVEL: Acetaminophen (Tylenol), Serum: <10 ug/mL — ABNORMAL LOW (ref 10–30)

## 2020-04-14 MED ORDER — ALPRAZOLAM 0.5 MG PO TABS
2.0000 mg | ORAL_TABLET | Freq: Four times a day (QID) | ORAL | Status: DC | PRN
  Administered 2020-04-15 (×2): 2 mg via ORAL
  Filled 2020-04-14 (×2): qty 4

## 2020-04-14 MED ORDER — AZELASTINE HCL 0.1 % NA SOLN
2.0000 | Freq: Two times a day (BID) | NASAL | Status: DC
  Filled 2020-04-14 (×2): qty 30

## 2020-04-14 MED ORDER — PANTOPRAZOLE SODIUM 40 MG PO TBEC
40.0000 mg | DELAYED_RELEASE_TABLET | Freq: Two times a day (BID) | ORAL | Status: DC
  Administered 2020-04-15 (×2): 40 mg via ORAL
  Filled 2020-04-14 (×2): qty 1

## 2020-04-14 MED ORDER — CLOPIDOGREL BISULFATE 75 MG PO TABS
75.0000 mg | ORAL_TABLET | Freq: Every day | ORAL | Status: DC
  Administered 2020-04-15: 11:00:00 75 mg via ORAL
  Filled 2020-04-14: qty 1

## 2020-04-14 MED ORDER — POLYVINYL ALCOHOL 1.4 % OP SOLN
1.0000 [drp] | OPHTHALMIC | Status: DC | PRN
  Filled 2020-04-14 (×2): qty 15

## 2020-04-14 MED ORDER — OXCARBAZEPINE 300 MG PO TABS
ORAL_TABLET | ORAL | Status: AC
  Filled 2020-04-14: qty 2

## 2020-04-14 MED ORDER — GABAPENTIN 600 MG PO TABS
600.0000 mg | ORAL_TABLET | Freq: Three times a day (TID) | ORAL | Status: DC
  Filled 2020-04-14 (×5): qty 1

## 2020-04-14 MED ORDER — DEXTRAN 70-HYPROMELLOSE 0.1-0.3 % OP SOLN: 1.0000 [drp] | OPHTHALMIC | Status: DC | PRN

## 2020-04-14 MED ORDER — ACETAMINOPHEN 325 MG PO TABS
650.0000 mg | ORAL_TABLET | ORAL | Status: DC | PRN
  Filled 2020-04-14: qty 2

## 2020-04-14 MED ORDER — FERROUS SULFATE 325 (65 FE) MG PO TABS: 325.0000 mg | ORAL_TABLET | Freq: Two times a day (BID) | ORAL | Status: DC

## 2020-04-14 MED ORDER — VITAMIN D 25 MCG (1000 UNIT) PO TABS
5000.0000 [IU] | ORAL_TABLET | Freq: Every day | ORAL | Status: DC
  Administered 2020-04-15: 11:00:00 5000 [IU] via ORAL
  Filled 2020-04-14 (×3): qty 5

## 2020-04-14 MED ORDER — GABAPENTIN 300 MG PO CAPS
ORAL_CAPSULE | ORAL | Status: AC
  Filled 2020-04-14: qty 2

## 2020-04-14 MED ORDER — ALBUTEROL SULFATE HFA 108 (90 BASE) MCG/ACT IN AERS: 1.0000 | INHALATION_SPRAY | Freq: Four times a day (QID) | RESPIRATORY_TRACT | Status: DC | PRN

## 2020-04-14 MED ORDER — FENOFIBRATE 54 MG PO TABS
54.0000 mg | ORAL_TABLET | Freq: Every morning | ORAL | Status: DC
  Administered 2020-04-15: 11:00:00 54 mg via ORAL
  Filled 2020-04-14 (×3): qty 1

## 2020-04-14 MED ORDER — AZELASTINE HCL 0.1 % NA SOLN
NASAL | Status: AC
  Filled 2020-04-14: qty 30

## 2020-04-14 MED ORDER — DULOXETINE HCL 30 MG PO CPEP
30.0000 mg | ORAL_CAPSULE | Freq: Every day | ORAL | Status: DC
  Administered 2020-04-15: 11:00:00 30 mg via ORAL
  Filled 2020-04-14: qty 1

## 2020-04-14 MED ORDER — POTASSIUM CHLORIDE CRYS ER 20 MEQ PO TBCR
20.0000 meq | EXTENDED_RELEASE_TABLET | Freq: Once | ORAL | Status: AC
  Administered 2020-04-14: 18:00:00 20 meq via ORAL
  Filled 2020-04-14: qty 1

## 2020-04-14 MED ORDER — BACLOFEN 10 MG PO TABS
20.0000 mg | ORAL_TABLET | Freq: Four times a day (QID) | ORAL | Status: DC
Start: 2020-04-14 — End: 2020-04-15
  Administered 2020-04-15 (×2): 20 mg via ORAL
  Filled 2020-04-14 (×3): qty 2

## 2020-04-14 MED ORDER — FERROUS SULFATE 325 (65 FE) MG PO TABS
325.0000 mg | ORAL_TABLET | Freq: Two times a day (BID) | ORAL | Status: DC
  Administered 2020-04-15 (×2): 325 mg via ORAL
  Filled 2020-04-14 (×2): qty 1

## 2020-04-14 MED ORDER — LEVETIRACETAM 500 MG PO TABS
500.0000 mg | ORAL_TABLET | Freq: Two times a day (BID) | ORAL | Status: DC
  Administered 2020-04-14 – 2020-04-15 (×2): 500 mg via ORAL
  Filled 2020-04-14 (×2): qty 1

## 2020-04-14 MED ORDER — OXCARBAZEPINE 300 MG PO TABS
600.0000 mg | ORAL_TABLET | Freq: Two times a day (BID) | ORAL | Status: DC
  Administered 2020-04-14 – 2020-04-15 (×2): 600 mg via ORAL
  Filled 2020-04-14 (×5): qty 2

## 2020-04-14 MED ORDER — ASPIRIN EC 81 MG PO TBEC
81.0000 mg | DELAYED_RELEASE_TABLET | Freq: Every day | ORAL | Status: DC
  Administered 2020-04-15: 11:00:00 81 mg via ORAL
  Filled 2020-04-14: qty 1
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
## 2020-04-14 NOTE — ED Notes (Signed)
Pt to x ray with sitter
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
## 2020-04-14 NOTE — ED Notes (Signed)
Back to ED H8

## 2020-04-15 ENCOUNTER — Encounter (HOSPITAL_COMMUNITY): Payer: Self-pay | Admitting: Psychiatry

## 2020-04-15 ENCOUNTER — Inpatient Hospital Stay (HOSPITAL_COMMUNITY)
Admission: AD | Admit: 2020-04-15 | Discharge: 2020-04-17 | DRG: 885 | Disposition: A | Discharge status: Home / Self Care | Payer: Medicaid Other | Source: Intra-hospital | Attending: Psychiatry | Admitting: Psychiatry

## 2020-04-15 ENCOUNTER — Other Ambulatory Visit: Payer: Self-pay | Admitting: Psychiatric/Mental Health

## 2020-04-15 ENCOUNTER — Encounter (HOSPITAL_COMMUNITY): Payer: Self-pay | Admitting: Psychiatric/Mental Health

## 2020-04-15 DIAGNOSIS — Z818 Family history of other mental and behavioral disorders: Secondary | ICD-10-CM

## 2020-04-15 DIAGNOSIS — F4310 Post-traumatic stress disorder, unspecified: Secondary | ICD-10-CM | POA: Diagnosis present

## 2020-04-15 DIAGNOSIS — G43909 Migraine, unspecified, not intractable, without status migrainosus: Secondary | ICD-10-CM | POA: Diagnosis present

## 2020-04-15 DIAGNOSIS — T424X2A Poisoning by benzodiazepines, intentional self-harm, initial encounter: Secondary | ICD-10-CM | POA: Diagnosis present

## 2020-04-15 DIAGNOSIS — F322 Major depressive disorder, single episode, severe without psychotic features: Principal | ICD-10-CM | POA: Diagnosis present

## 2020-04-15 DIAGNOSIS — Z8673 Personal history of transient ischemic attack (TIA), and cerebral infarction without residual deficits: Secondary | ICD-10-CM | POA: Diagnosis not present

## 2020-04-15 DIAGNOSIS — F332 Major depressive disorder, recurrent severe without psychotic features: Secondary | ICD-10-CM | POA: Diagnosis present

## 2020-04-15 DIAGNOSIS — Z87891 Personal history of nicotine dependence: Secondary | ICD-10-CM | POA: Diagnosis not present

## 2020-04-15 DIAGNOSIS — Z20822 Contact with and (suspected) exposure to covid-19: Secondary | ICD-10-CM | POA: Diagnosis present

## 2020-04-15 DIAGNOSIS — K219 Gastro-esophageal reflux disease without esophagitis: Secondary | ICD-10-CM | POA: Diagnosis present

## 2020-04-15 DIAGNOSIS — J449 Chronic obstructive pulmonary disease, unspecified: Secondary | ICD-10-CM | POA: Diagnosis present

## 2020-04-15 DIAGNOSIS — D509 Iron deficiency anemia, unspecified: Secondary | ICD-10-CM | POA: Diagnosis present

## 2020-04-15 DIAGNOSIS — T50902A Poisoning by unspecified drugs, medicaments and biological substances, intentional self-harm, initial encounter: Secondary | ICD-10-CM | POA: Diagnosis not present

## 2020-04-15 LAB — URINALYSIS, ROUTINE W REFLEX MICROSCOPIC
Bilirubin Urine: NEGATIVE
Glucose, UA: NEGATIVE mg/dL
Hgb urine dipstick: NEGATIVE
Ketones, ur: NEGATIVE mg/dL
Nitrite: NEGATIVE
Protein, ur: NEGATIVE mg/dL
Specific Gravity, Urine: 1.006 (ref 1.005–1.030)
pH: 6 (ref 5.0–8.0)

## 2020-04-15 MED ORDER — DULOXETINE HCL 20 MG PO CPEP
40.0000 mg | ORAL_CAPSULE | Freq: Every day | ORAL | Status: DC
  Administered 2020-04-16: 08:00:00 40 mg via ORAL
  Filled 2020-04-15 (×2): qty 2

## 2020-04-15 MED ORDER — ALBUTEROL SULFATE (2.5 MG/3ML) 0.083% IN NEBU: 2.5000 mg | INHALATION_SOLUTION | Freq: Four times a day (QID) | RESPIRATORY_TRACT | Status: DC | PRN

## 2020-04-15 MED ORDER — BACLOFEN 20 MG PO TABS
20.0000 mg | ORAL_TABLET | Freq: Three times a day (TID) | ORAL | Status: DC
  Administered 2020-04-16 – 2020-04-17 (×4): 20 mg via ORAL
  Filled 2020-04-15 (×10): qty 1

## 2020-04-15 MED ORDER — LEVETIRACETAM 500 MG PO TABS
500.0000 mg | ORAL_TABLET | Freq: Two times a day (BID) | ORAL | Status: DC
  Administered 2020-04-16 – 2020-04-17 (×3): 500 mg via ORAL
  Filled 2020-04-15 (×7): qty 1

## 2020-04-15 MED ORDER — ASPIRIN EC 81 MG PO TBEC
81.0000 mg | DELAYED_RELEASE_TABLET | Freq: Every day | ORAL | Status: DC
  Administered 2020-04-16 – 2020-04-17 (×2): 81 mg via ORAL
  Filled 2020-04-15 (×4): qty 1

## 2020-04-15 MED ORDER — GABAPENTIN 300 MG PO CAPS
600.0000 mg | ORAL_CAPSULE | Freq: Three times a day (TID) | ORAL | Status: DC
  Administered 2020-04-16 – 2020-04-17 (×4): 600 mg via ORAL
  Filled 2020-04-15 (×10): qty 2

## 2020-04-15 MED ORDER — CEPHALEXIN 500 MG PO CAPS
500.0000 mg | ORAL_CAPSULE | Freq: Three times a day (TID) | ORAL | Status: DC
  Administered 2020-04-15 (×2): 500 mg via ORAL
  Filled 2020-04-15 (×2): qty 1

## 2020-04-15 MED ORDER — GABAPENTIN 300 MG PO CAPS
600.0000 mg | ORAL_CAPSULE | Freq: Three times a day (TID) | ORAL | Status: DC
  Administered 2020-04-15 (×2): 600 mg via ORAL
  Filled 2020-04-15 (×2): qty 2

## 2020-04-15 MED ORDER — VITAMIN D 25 MCG (1000 UNIT) PO TABS: 5000.0000 [IU] | ORAL_TABLET | Freq: Every day | ORAL | Status: DC

## 2020-04-15 MED ORDER — AZELASTINE HCL 0.1 % NA SOLN
2.0000 | Freq: Two times a day (BID) | NASAL | Status: DC
  Administered 2020-04-16: 08:00:00 2 via NASAL
  Filled 2020-04-15: qty 30

## 2020-04-15 MED ORDER — TRAZODONE HCL 50 MG PO TABS: 50.0000 mg | ORAL_TABLET | Freq: Every evening | ORAL | Status: DC | PRN

## 2020-04-15 MED ORDER — ALUM & MAG HYDROXIDE-SIMETH 200-200-20 MG/5ML PO SUSP: 30.0000 mL | ORAL | Status: DC | PRN

## 2020-04-15 MED ORDER — BACLOFEN 10 MG PO TABS
20.0000 mg | ORAL_TABLET | Freq: Three times a day (TID) | ORAL | Status: DC
  Administered 2020-04-15: 20:00:00 20 mg via ORAL
  Filled 2020-04-15: qty 2

## 2020-04-15 MED ORDER — CEPHALEXIN 500 MG PO CAPS: 500.0000 mg | ORAL_CAPSULE | Freq: Three times a day (TID) | ORAL | 0 refills | Status: DC

## 2020-04-15 MED ORDER — CEPHALEXIN 500 MG PO CAPS
500.0000 mg | ORAL_CAPSULE | Freq: Three times a day (TID) | ORAL | Status: DC
  Administered 2020-04-16 – 2020-04-17 (×4): 500 mg via ORAL
  Filled 2020-04-15: qty 2
  Filled 2020-04-15 (×10): qty 1

## 2020-04-15 MED ORDER — CLOPIDOGREL BISULFATE 75 MG PO TABS: 75.0000 mg | ORAL_TABLET | Freq: Every day | ORAL | Status: DC

## 2020-04-15 MED ORDER — POLYVINYL ALCOHOL 1.4 % OP SOLN: 1.0000 [drp] | OPHTHALMIC | Status: DC | PRN

## 2020-04-15 MED ORDER — TRAZODONE HCL 50 MG PO TABS
50.0000 mg | ORAL_TABLET | Freq: Every evening | ORAL | Status: DC | PRN
  Administered 2020-04-15 – 2020-04-16 (×2): 50 mg via ORAL
  Filled 2020-04-15 (×2): qty 1

## 2020-04-15 MED ORDER — HYDROXYZINE HCL 25 MG PO TABS
25.0000 mg | ORAL_TABLET | Freq: Three times a day (TID) | ORAL | Status: DC | PRN
  Administered 2020-04-15 – 2020-04-16 (×2): 25 mg via ORAL
  Filled 2020-04-15 (×2): qty 1

## 2020-04-15 MED ORDER — OXCARBAZEPINE 300 MG PO TABS
600.0000 mg | ORAL_TABLET | Freq: Two times a day (BID) | ORAL | Status: DC
  Filled 2020-04-15 (×2): qty 2

## 2020-04-15 MED ORDER — GABAPENTIN 300 MG PO CAPS
600.0000 mg | ORAL_CAPSULE | Freq: Three times a day (TID) | ORAL | Status: DC
  Administered 2020-04-15: 20:00:00 600 mg via ORAL
  Filled 2020-04-15: qty 2

## 2020-04-15 MED ORDER — VITAMIN D3 25 MCG PO TABS
5000.0000 [IU] | ORAL_TABLET | Freq: Every day | ORAL | Status: DC
  Administered 2020-04-16 – 2020-04-17 (×2): 5000 [IU] via ORAL
  Filled 2020-04-15 (×4): qty 5

## 2020-04-15 MED ORDER — PANTOPRAZOLE SODIUM 40 MG PO TBEC: 40.0000 mg | DELAYED_RELEASE_TABLET | Freq: Two times a day (BID) | ORAL | Status: DC

## 2020-04-15 MED ORDER — MAGNESIUM HYDROXIDE 400 MG/5ML PO SUSP: 30.0000 mL | Freq: Every day | ORAL | Status: DC | PRN

## 2020-04-15 MED ORDER — CEPHALEXIN 500 MG PO CAPS: 500.0000 mg | ORAL_CAPSULE | Freq: Three times a day (TID) | ORAL | Status: DC

## 2020-04-15 MED ORDER — AZELASTINE HCL 0.1 % NA SOLN: 2.0000 | Freq: Two times a day (BID) | NASAL | Status: DC

## 2020-04-15 MED ORDER — OXCARBAZEPINE 300 MG PO TABS
600.0000 mg | ORAL_TABLET | Freq: Two times a day (BID) | ORAL | Status: DC
  Administered 2020-04-15 – 2020-04-17 (×4): 600 mg via ORAL
  Filled 2020-04-15 (×9): qty 2

## 2020-04-15 MED ORDER — CLOPIDOGREL BISULFATE 75 MG PO TABS
75.0000 mg | ORAL_TABLET | Freq: Every day | ORAL | Status: DC
  Administered 2020-04-16 – 2020-04-17 (×2): 75 mg via ORAL
  Filled 2020-04-15 (×4): qty 1

## 2020-04-15 MED ORDER — ACETAMINOPHEN 325 MG PO TABS: 650.0000 mg | ORAL_TABLET | Freq: Four times a day (QID) | ORAL | Status: DC | PRN

## 2020-04-15 MED ORDER — NICOTINE 21 MG/24HR TD PT24
21.0000 mg | MEDICATED_PATCH | Freq: Once | TRANSDERMAL | Status: DC
  Administered 2020-04-15: 12:00:00 21 mg via TRANSDERMAL
  Filled 2020-04-15: qty 1

## 2020-04-15 MED ORDER — ACETAMINOPHEN 325 MG PO TABS
650.0000 mg | ORAL_TABLET | Freq: Four times a day (QID) | ORAL | Status: DC | PRN
  Administered 2020-04-16: 650 mg via ORAL
  Filled 2020-04-15: qty 2

## 2020-04-15 MED ORDER — ALPRAZOLAM 0.5 MG PO TABS: 2.0000 mg | ORAL_TABLET | Freq: Four times a day (QID) | ORAL | Status: DC | PRN

## 2020-04-15 MED ORDER — FENOFIBRATE 160 MG PO TABS
80.0000 mg | ORAL_TABLET | Freq: Every day | ORAL | Status: DC
  Administered 2020-04-16 – 2020-04-17 (×2): 80 mg via ORAL
  Filled 2020-04-15 (×4): qty 0.5

## 2020-04-15 MED ORDER — FENOFIBRATE 160 MG PO TABS
80.0000 mg | ORAL_TABLET | Freq: Every day | ORAL | Status: DC
  Filled 2020-04-15: qty 0.5

## 2020-04-15 MED ORDER — FERROUS SULFATE 325 (65 FE) MG PO TABS
325.0000 mg | ORAL_TABLET | Freq: Two times a day (BID) | ORAL | Status: DC
  Administered 2020-04-16 – 2020-04-17 (×3): 325 mg via ORAL
  Filled 2020-04-15 (×7): qty 1

## 2020-04-15 MED ORDER — NICOTINE 21 MG/24HR TD PT24: 21.0000 mg | MEDICATED_PATCH | Freq: Every day | TRANSDERMAL | Status: DC

## 2020-04-15 MED ORDER — IBUPROFEN 800 MG PO TABS
800.0000 mg | ORAL_TABLET | Freq: Once | ORAL | Status: AC
  Administered 2020-04-15: 23:00:00 800 mg via ORAL
  Filled 2020-04-15 (×2): qty 1

## 2020-04-15 MED ORDER — FERROUS SULFATE 325 (65 FE) MG PO TABS: 325.0000 mg | ORAL_TABLET | Freq: Two times a day (BID) | ORAL | Status: DC

## 2020-04-15 MED ORDER — ALBUTEROL SULFATE HFA 108 (90 BASE) MCG/ACT IN AERS: 1.0000 | INHALATION_SPRAY | Freq: Four times a day (QID) | RESPIRATORY_TRACT | Status: DC | PRN

## 2020-04-15 MED ORDER — LEVETIRACETAM 500 MG PO TABS
500.0000 mg | ORAL_TABLET | Freq: Two times a day (BID) | ORAL | Status: DC
  Administered 2020-04-15: 20:00:00 500 mg via ORAL
  Filled 2020-04-15: qty 1

## 2020-04-15 MED ORDER — IBUPROFEN 600 MG PO TABS: 600.0000 mg | ORAL_TABLET | Freq: Four times a day (QID) | ORAL | Status: DC | PRN

## 2020-04-15 MED ORDER — DULOXETINE HCL 20 MG PO CPEP: 40.0000 mg | ORAL_CAPSULE | Freq: Every day | ORAL | Status: DC

## 2020-04-15 MED ORDER — PANTOPRAZOLE SODIUM 40 MG PO TBEC
40.0000 mg | DELAYED_RELEASE_TABLET | Freq: Two times a day (BID) | ORAL | Status: DC
  Administered 2020-04-16 – 2020-04-17 (×3): 40 mg via ORAL
  Filled 2020-04-15 (×8): qty 1

## 2020-04-15 MED ORDER — ASPIRIN EC 81 MG PO TBEC: 81.0000 mg | DELAYED_RELEASE_TABLET | Freq: Every day | ORAL | Status: DC

## 2020-04-15 MED ORDER — HYDROXYZINE HCL 25 MG PO TABS: 25.0000 mg | ORAL_TABLET | Freq: Three times a day (TID) | ORAL | Status: DC | PRN
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
## 2020-04-15 NOTE — ED Notes (Signed)
Pt refused lunch demanding an Ensure.  Pt given water and Keflex as per ordered.

## 2020-04-15 NOTE — ED Notes (Signed)
Pt given night meds per [facility].

## 2020-04-15 NOTE — ED Notes (Signed)
Pt called this nurse a "rude bitch" due to explaining to the patient that I needed to add adhesive tape to her medical records as an allergy. I explained to this patient that she was at a different facility from her last facilty and we needed to ensure we have it in her records.  Pt continues to be challenging demanding drinks, food, and ensure.

## 2020-04-15 NOTE — ED Notes (Signed)
Pt left with safetransport.

## 2020-04-15 NOTE — ED Provider Notes (Signed)
Emergency Medicine Observation Re-evaluation Note  Lauren Cross is a 44 y.o. female, seen on rounds today.  Pt initially presented to the ED for complaints of Drug Overdose Currently, the patient is Resting comfortably. Now complaining of 'thrush' in her mouth because it sometimes hurts to swallow and a possible UTI.   Physical Exam  BP 90/79 (BP Location: Right Arm)    Pulse 84    Temp 98.4 F (36.9 C) (Oral)    Resp 16    Ht 5\' 5"  (1.651 m)    Wt 63.5 kg    LMP 07/07/2019 (Approximate)    SpO2 97%    BMI 23.30 kg/m  Physical Exam General: non toxic, no distress HEENT: No thrush on oral exam Psych: Calm and cooperative  ED Course / MDM  EKG:EKG Interpretation  Date/Time:  Monday April 14 2020 17:03:26 EST Ventricular Rate:  69 PR Interval:  182 QRS Duration: 76 QT Interval:  404 QTC Calculation: 432 R Axis:   34 Text Interpretation: Normal sinus rhythm Low voltage QRS Septal infarct , age undetermined Abnormal ECG No significant change since prior 4/21 Confirmed by 5/21 [phone]) on 04/14/2020 5:13:53 PM  Clinical Course as of 04/15/20 1210  Mon Apr 14, 2020  1628 Patient now feels she needs x-rays of her back and tailbone because of her heart she fell. [MB]    Clinical Course User Index [MB] 1629, MD   I have reviewed the labs performed to date as well as medications administered while in observation.  Recent changes in the last 24 hours include none.  Plan  Current plan is for psych reassessment for dispo today. Will add UA to labs done yesterday. . Patient is not under full IVC at this time.  12:21 PM Patient re-evaluated by Psych who are recommended inpatient admission. Patient is currently voluntary but they would recommend IVC if she tries to leave.    Terrilee Files, MD 04/15/20 [phone]

## 2020-04-15 NOTE — ED Notes (Signed)
Lequita Halt niece phone # [phone] Son [phone] Father in law [phone]

## 2020-04-15 NOTE — Progress Notes (Signed)
Pt accepted to [facility], bed 303-1  Marciano Sequin, NP is the accepting provider.    Dr. Jola Babinski is the attending provider.    Call report to [phone]    [facility] notified via secure chat    Pt is scheduled to arrive at [facility] at 8pm.    Wells Guiles, MSW, LCSW, LCAS Clinical Social Worker II Disposition CSW [phone]

## 2020-04-15 NOTE — ED Notes (Signed)
Spoke with Marylu Lund regarding pt contact information.  Numbers given: Arman Bogus [phone]

## 2020-04-15 NOTE — ED Notes (Signed)
Pt requesting to see provider.  She believes she has a UTI and thrush along with a vaginal infection.  provider at bedside.

## 2020-04-16 ENCOUNTER — Other Ambulatory Visit: Payer: Self-pay

## 2020-04-16 DIAGNOSIS — F332 Major depressive disorder, recurrent severe without psychotic features: Secondary | ICD-10-CM

## 2020-04-16 LAB — URINALYSIS, COMPLETE (UACMP) WITH MICROSCOPIC
Bilirubin Urine: NEGATIVE
Glucose, UA: NEGATIVE mg/dL
Hgb urine dipstick: NEGATIVE
Ketones, ur: NEGATIVE mg/dL
Nitrite: NEGATIVE
Protein, ur: NEGATIVE mg/dL
Specific Gravity, Urine: 1.02 (ref 1.005–1.030)
pH: 5 (ref 5.0–8.0)

## 2020-04-16 LAB — LIPID PANEL
Cholesterol: 309 mg/dL — ABNORMAL HIGH (ref 0–200)
HDL: 42 mg/dL (ref >40)
LDL Cholesterol: 200 mg/dL — ABNORMAL HIGH (ref 0–99)
Total CHOL/HDL Ratio: 7.4 RATIO
Triglycerides: 336 mg/dL — ABNORMAL HIGH (ref <150)
VLDL: 67 mg/dL — ABNORMAL HIGH (ref 0–40)

## 2020-04-16 LAB — HEMOGLOBIN A1C
Hgb A1c MFr Bld: 5.1 % (ref 4.8–5.6)
Mean Plasma Glucose: 99.67 mg/dL

## 2020-04-16 LAB — TSH: TSH: 1.874 u[IU]/mL (ref 0.350–4.500)

## 2020-04-16 MED ORDER — DULOXETINE HCL 30 MG PO CPEP
30.0000 mg | ORAL_CAPSULE | Freq: Every day | ORAL | Status: DC
Start: 2020-04-16 — End: 2020-04-17
  Filled 2020-04-16 (×3): qty 1

## 2020-04-16 MED ORDER — SUMATRIPTAN SUCCINATE 50 MG PO TABS
50.0000 mg | ORAL_TABLET | Freq: Every day | ORAL | Status: DC | PRN
  Administered 2020-04-16 – 2020-04-17 (×2): 50 mg via ORAL
  Filled 2020-04-16 (×2): qty 1

## 2020-04-16 MED ORDER — PROCHLORPERAZINE MALEATE 10 MG PO TABS: 10.0000 mg | ORAL_TABLET | Freq: Three times a day (TID) | ORAL | Status: DC | PRN

## 2020-04-16 MED ORDER — ALPRAZOLAM 0.5 MG PO TABS
1.0000 mg | ORAL_TABLET | Freq: Three times a day (TID) | ORAL | Status: DC | PRN
  Administered 2020-04-16 – 2020-04-17 (×4): 1 mg via ORAL
  Filled 2020-04-16 (×4): qty 2

## 2020-04-16 MED ORDER — ONDANSETRON 4 MG PO TBDP: 4.0000 mg | ORAL_TABLET | Freq: Two times a day (BID) | ORAL | Status: DC | PRN

## 2020-04-16 MED ORDER — NICOTINE 21 MG/24HR TD PT24
21.0000 mg | MEDICATED_PATCH | Freq: Every day | TRANSDERMAL | Status: DC
  Administered 2020-04-16 – 2020-04-17 (×2): 21 mg via TRANSDERMAL
  Filled 2020-04-16 (×4): qty 1

## 2020-04-16 MED ORDER — DULOXETINE HCL 60 MG PO CPEP
60.0000 mg | ORAL_CAPSULE | Freq: Every day | ORAL | Status: DC
Start: 2020-04-16 — End: 2020-04-17
  Administered 2020-04-16 – 2020-04-17 (×2): 60 mg via ORAL
  Filled 2020-04-16 (×4): qty 1

## 2020-04-16 MED ORDER — BOOST / RESOURCE BREEZE PO LIQD CUSTOM
1.0000 | Freq: Three times a day (TID) | ORAL | Status: DC
  Administered 2020-04-16 – 2020-04-17 (×4): 1 via ORAL
  Filled 2020-04-16 (×10): qty 1

## 2020-04-16 MED ORDER — UBROGEPANT 100 MG PO TABS: 100.0000 mg | ORAL_TABLET | Freq: Every day | ORAL | Status: DC | PRN

## 2020-04-16 MED ORDER — IBUPROFEN 800 MG PO TABS
800.0000 mg | ORAL_TABLET | Freq: Four times a day (QID) | ORAL | Status: DC | PRN
  Administered 2020-04-16 – 2020-04-17 (×2): 800 mg via ORAL
  Filled 2020-04-16 (×2): qty 1

## 2020-04-16 MED ORDER — VITAMIN D3 25 MCG PO TABS
5000.0000 [IU] | ORAL_TABLET | Freq: Every day | ORAL | Status: DC
  Administered 2020-04-16: 12:00:00 5000 [IU] via ORAL
  Filled 2020-04-16: qty 5

## 2020-04-16 MED ORDER — ONDANSETRON 4 MG PO TBDP
4.0000 mg | ORAL_TABLET | Freq: Two times a day (BID) | ORAL | Status: DC | PRN
  Administered 2020-04-16 – 2020-04-17 (×2): 4 mg via ORAL
  Filled 2020-04-16: qty 1

## 2020-04-16 NOTE — Progress Notes (Signed)
1:1 Note 1230  Patient stated she ate half of her lunch.  Respirations even and unlabored.  No signs/symptoms of pain/distress noted on patient's face/body movements.  Safety maintained with 1:1 per MD orders.

## 2020-04-16 NOTE — Evaluation (Signed)
Physical Therapy Evaluation Patient Details Name: Lauren Cross MRN: [id number] DOB: [birth year] Today's Date: 04/16/2020   History of Present Illness  Patient is 44 y.o. female admitted to [facility] after presenting to [facility] following intentional Xanax overdose. She has a longstanding history of depression, PTSD, anxiety and multiple medical problems including bil carotid artery stenosis, stroke, chronic pain, seizures, migraines, hypoglycemia, fatty liver,HTN, pancreatitis, HLD, low back pain, immobility, previous cognitive impairment and speech difficulty, MI, neuropathy, a work-up currently in progress for multiple sclerosis, recent tib-fib fracutre in April 2021 s/p ORIF.   Clinical Impression  MIQUEL OVESON is 44 y.o. female admitted with above HPI and diagnosis. Patient is currently limited by functional impairments below (see PT problem list). Patient lives with her family and is independent with RW for mobility at baseline since Lt ankle fracture in April 2021. She currently is mobilizing with RW and supervision/min guard for safety with gait. Patient educated on safe management of RW and how to adjust home walker to improve fit. Patient will benefit from continued skilled PT interventions to address impairments and progress independence with mobility. Acute PT will follow and progress as able. Anticipate not PT follow up after discharge home.       04/16/20 1300  PT Visit Information  Last PT Received On 04/16/20  Assistance Needed +1  History of Present Illness Patient is 45 y.o. female admitted to [facility] after presenting to [place] following intentional Xanax overdose. She has a longstanding history of depression, PTSD, anxiety and multiple medical problems including bil carotid artery stenosis, stroke, chronic pain, seizures, migraines, hypoglycemia, fatty liver,HTN, pancreatitis, HLD, low back pain, immobility, previous cognitive impairment and speech difficulty, MI, neuropathy, a work-up currently in  progress for multiple sclerosis, recent tib-fib fracutre in April 2021 s/p ORIF.  Precautions  Precautions Fall  Restrictions  Weight Bearing Restrictions No  Other Position/Activity Restrictions pt prefers to waer CAM boot on Lt LE; s/p ORIF for tib-fib fx in April 2021. MD has not responded on pt's restrictions.  Home Living  Family/patient expects to be discharged to: Unsure  Living Arrangements Parent;Children  Available Help at Discharge Family  Type of [place] to enter  Entrance Stairs-Number of Steps 3  Entrance Stairs-Rails [place] One level  Bathroom Shower/Tub Tub/shower unit;Walk-in Landscape architect - 2 wheels;BSC  Prior Function  Level of Independence Independent  Communication  Communication No difficulties  Pain Assessment  Pain Assessment Faces  Faces Pain Scale 0  Pain Intervention(s) Monitored during session;Limited activity within patient's tolerance  Cognition  Arousal/Alertness Awake/alert  Behavior During Therapy WFL for tasks assessed/performed  Overall Cognitive Status Within Functional Limits for tasks assessed  Upper Extremity Assessment  Upper Extremity Assessment Overall WFL for tasks assessed  Lower Extremity Assessment  Lower Extremity Assessment Overall WFL for tasks assessed  Cervical / Trunk Assessment  Cervical / Trunk Assessment Normal  Bed Mobility  Overal bed mobility Modified Independent  General bed mobility comments no assist needed. pt sitting EOB at start of session.  Transfers  Overall transfer level Needs assistance  Equipment used Rolling walker (2 wheeled)  Transfers Sit to/from Stand  Sit to Stand Supervision  General transfer comment no assist required for power up from EOB, pt steady once standing with RW.  Ambulation/Gait  Ambulation/Gait assistance Min guard;Supervision  Gait Distance (Feet) 80 Feet  Assistive device Rolling walker  (2 wheeled)  Gait Pattern/deviations Step-through  pattern  General Gait Details Cues for safe position to RW. pt with tendency to take extra long steps and cues required to shorten for improved position to RW. no overt LOB noted. close guard/supervision for safety.  Gait velocity decr  PT - End of Session  Equipment Utilized During Treatment Gait belt  Activity Tolerance Patient tolerated treatment well  Patient left in chair;with nursing/sitter in room;Other (comment) (in day room with CSW leading group)  Nurse Communication Mobility status  PT Assessment  PT Recommendation/Assessment Patient needs continued PT services  PT Visit Diagnosis Unsteadiness on feet (R26.81);Difficulty in walking, not elsewhere classified (R26.2)  PT Problem List Decreased strength;Decreased activity tolerance;Decreased balance;Decreased mobility;Decreased safety awareness;Decreased knowledge of use of DME  PT Plan  PT Frequency (ACUTE ONLY) Min 2X/week  PT Treatment/Interventions (ACUTE ONLY) DME instruction;Gait training;Stair training;Functional mobility training;Therapeutic activities;Therapeutic exercise;Balance training;Patient/family education  AM-PAC PT "6 Clicks" Mobility Outcome Measure (Version 2)  Help needed turning from your back to your side while in a flat bed without using bedrails? 4  Help needed moving from lying on your back to sitting on the side of a flat bed without using bedrails? 4  Help needed moving to and from a bed to a chair (including a wheelchair)? 3  Help needed standing up from a chair using your arms (e.g., wheelchair or bedside chair)? 4  Help needed to walk in hospital room? 3  Help needed climbing 3-5 steps with a railing?  3  6 Click Score 21  Consider Recommendation of Discharge To: Home with no services  PT Recommendation  Follow Up Recommendations No PT follow up  PT equipment None recommended by PT  Individuals Consulted  Consulted and Agree with Results and  Recommendations Patient  Acute Rehab PT Goals  Patient Stated Goal to improve independence and return home  PT Goal Formulation With patient  Time For Goal Achievement 04/30/20  Potential to Achieve Goals Good  PT Time Calculation  PT Start Time (ACUTE ONLY) 1303  PT Stop Time (ACUTE ONLY) 1316  PT Time Calculation (min) (ACUTE ONLY) 13 min  PT General Charges  $$ ACUTE PT VISIT 1 Visit  PT Evaluation  $PT Eval Low Complexity 1 Low  Written Expression  Dominant Hand Right    Wynn Maudlin, DPT Acute Rehabilitation Services Office [phone] Pager [phone]

## 2020-04-16 NOTE — Progress Notes (Signed)
1:1 Note 1500   Patient in her room laying in bed.  Patient has been writing and reading while 1:1 is in her room.  Respirations even and unlabored.  No signs/symptoms of pain/distress noted on patient's face/body movements. 1:1 continues per MD order for safety.

## 2020-04-16 NOTE — Progress Notes (Signed)
Pt continues on 1:1 sitter for safety because she remains unsteady on her feet and is at a high fall risk. 1:1 sitter remains readily available at pt's bedside. Pt is currently sleeping in bed with her eyes closed. Her respirations are even and unlabored. No distress has been observed. Pt continues to need gait belt, walker, and assistance with ambulation. Q 15 min safety checks continue. Pt's safety has been maintained.

## 2020-04-16 NOTE — H&P (Signed)
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 clear.  Eyes:     Pupils: Pupils are equal, round, and reactive to light.  Cardiovascular:     Rate and Rhythm: Normal  rate and regular rhythm.     Pulses: Normal pulses.  Pulmonary:     Effort: Pulmonary effort is normal.     Breath sounds: Normal breath sounds.  Abdominal:     Palpations: Abdomen is soft.  Genitourinary:    Comments: Deferred Musculoskeletal:        General: Normal range of motion.     Cervical back: Normal range of motion.  Skin:    General: Skin is warm and dry.  Neurological:     General: No focal deficit present.     Mental Status: She is alert and oriented to person, place, and time.     Review of Systems  Constitutional: Negative for chills, diaphoresis and fever.  HENT: Negative for congestion, rhinorrhea, sneezing and sore throat.   Eyes: Negative for discharge.  Respiratory: Negative for cough, shortness of  breath and wheezing.   Cardiovascular: Negative for chest pain and palpitations.  Gastrointestinal: Negative for diarrhea, nausea and vomiting.  Endocrine: Negative for cold intolerance.  Genitourinary: Negative for difficulty urinating.  Musculoskeletal: Negative for arthralgias and myalgias.  Skin: Negative for color change.  Allergic/Immunologic: Negative for environmental allergies and food allergies.       Allergies:  Zetia   Zofran   Hydromorphone. Moviprep  Oxycodone  Tape  Codeine       Neurological: Negative for dizziness, tremors, seizures, syncope, facial asymmetry, speech difficulty, weakness, light-headedness, numbness and headaches.  Psychiatric/Behavioral: Positive for dysphoric mood, self-injury (Report indicated patient threw herself on the ground upon arrival to the [facility] hospital). and suicidal ideas ( Recent suicide attempt by overdose). Negative for confusion, decreased concentration, hallucinations and sleep disturbance. The patient is not nervous/anxious and is not hyperactive.     Blood pressure 114/90, pulse 80, temperature 97.9 F (36.6 C), temperature source Oral, resp. rate 18, height 5\' 5"  (1.651 m), weight 65.8 kg, last menstrual period 07/07/2019, SpO2 100 %.Body mass index is 24.13 kg/m.  General Appearance: Disheveled  Eye Contact:  Fair  Speech:  Normal Rate  Volume:  Increased  Mood:  Anxious and Depressed  Affect:  Congruent  Thought Process:  Coherent and Descriptions of Associations: Intact  Orientation:  Full (Time, Place, and Person)  Thought Content:  Logical  Suicidal Thoughts:  No  Homicidal Thoughts:  No  Memory:  Immediate;   Fair Recent;   Fair Remote;   Fair  Judgement:  Intact  Insight:  Fair  Psychomotor Activity:  Increased  Concentration:  Concentration: Fair and Attention Span: Fair  Recall:  [employer] of Knowledge:  Fair  Language:  Fair  Akathisia:  Negative  Handed:  Right  AIMS (if indicated):     Assets:  Desire  for Improvement Resilience  ADL's:  Impaired  Cognition:  Impaired,  Mild    Sleep: New admit.   Treatment Plan Summary: Daily contact with patient to assess and evaluate symptoms and progress in treatment and Medication management.  Treatment Plan/Recommendations:  1. Admit for crisis management and stabilization, estimated length of stay 3-5 days.    2. Medication management to reduce current symptoms to base line and improve the patient's overall level of functioning: See MAR, Md's SRA & treatment plan.   Observation Level/Precautions:  15 minute checks  Laboratory:  Per ED, current labs reviewed. UDS (+) for Benzodiazepine.  Psychotherapy: Group sessions  Medications: See MAR.  Consultations: As needed.    Discharge Concerns: Safety, mood stability.  Estimated LOS: 3-5 days  Other: Admit to  the 300-hall.    Physician Treatment Plan for Primary Diagnosis: Severe recurrent major depression without psychotic features ([place])   Long Term Goal(s): Improvement in symptoms so as ready for discharge  Short Term Goals: Ability to identify changes in lifestyle to reduce recurrence of condition will improve, Ability to verbalize feelings will improve, Ability to disclose and discuss suicidal ideas and Ability to demonstrate self-control will improve  Physician Treatment Plan for Secondary Diagnosis: Principal Problem:   Severe recurrent major depression without psychotic features ([place])  Long Term Goal(s): Improvement in symptoms so as ready for discharge  Short Term Goals: Ability to demonstrate self-control will improve, Ability to identify and develop effective coping behaviors will improve and Compliance with prescribed medications will improve  I certify that inpatient services furnished can reasonably be expected to improve the patient's condition.    Lindell Spar, NP,PMHNP, FNP-BC  12/29/202110:32 AM

## 2020-04-16 NOTE — [facility] Group Notes (Signed)
[facility] LCSW Group Therapy  04/16/2020 2:06 PM  Type of Therapy:  Calming Anxiety   Participation Level:  Active  Participation Quality:  Appropriate and Sharing  Affect:  Depressed and Tearful  Cognitive:  Disorganized  Insight:  Developing/Improving  Engagement in Therapy:  Developing/Improving  Modes of Intervention:  Discussion  Summary of Progress/Problems: Chanin came to group late but remained there until the end.  Rayanna states that she uses deep breathing and music to help her with anxiety.  Mariadelaluz states that she also focuses on her daughter to help her with anxiety and to forget about things.  Sheala states that she has a sister in [place] that is a support for her and that she has been offered a room at her sister's house.  Salome states that this is something this she is considering.   Metro Kung Kymora Sciara 04/16/2020, 2:06 PM

## 2020-04-16 NOTE — Tx Team (Signed)
Initial Treatment Plan 04/16/2020 3:16 AM Lenetta Quaker PRX:[id number]    PATIENT STRESSORS: Health problems Marital or family conflict Medication change or noncompliance   PATIENT STRENGTHS: General fund of knowledge Motivation for treatment/growth   PATIENT IDENTIFIED PROBLEMS:  risk SI  Depression  "nothing"                 DISCHARGE CRITERIA:  Improved stabilization in mood, thinking, and/or behavior Verbal commitment to aftercare and medication compliance  PRELIMINARY DISCHARGE PLAN: Attend aftercare/continuing care group Attend 12-step recovery group Outpatient therapy  PATIENT/FAMILY INVOLVEMENT: This treatment plan has been presented to and reviewed with the patient, Lenetta Quaker.  The patient and family have been given the opportunity to ask questions and make suggestions.  Delos Haring, RN 04/16/2020, 3:16 AM

## 2020-04-16 NOTE — Progress Notes (Signed)
Pt continues on 1:1 sitter for safety because she remains unsteady on her feet and is at a high fall risk. 1:1 sitter remains readily available at pt's bedside. Pt is currently sleeping in bed with her eyes closed. Pt is snoring. Her respirations are even and unlabored. No distress has been observed. Q 15 min safety checks continue. Pt's safety has been maintained.

## 2020-04-16 NOTE — Progress Notes (Signed)
1:1 Note 1000  Patient sitting in bed talking to 1:1.  MD and SW has talked to patient this morning.  Patient denied SI and HI, contracts for safety.  Denied A/V hallucinations.  Walker, shower chair and bedside toilet placed in patient's room for safety per MD order.  Respirations even and unlabored.  No signs/symptoms of pain/distress noted on patient's face/body movements.  1:1 continues for patient's safety per MD orders.

## 2020-04-16 NOTE — Progress Notes (Signed)
1:1 Note 1500 Patient was assisted to the shower using the shower chair.  Washed hair, stated she felt much better after her shower.  Patient has been writing, talking to 1:1.   Respirations even and unlabored.  No signs/symptoms of pain/distress noted on patient's face/body movements.  1:1 continues per MD order for safety.

## 2020-04-16 NOTE — Progress Notes (Signed)
1:1 Note 0800  Patient sitting on her bed, talking to 1:1.  Patient denied SI and HI, contracts for safety.  Denied A/V hallucinations.  Breakfast was given to patient, extra towels/wash cloths.  Patient stated she ate all of her breakfast.  Respirations even and unlabored.  No signs/symptoms of pain/distress noted on patient's face/body movements.  1:1 present for safety.

## 2020-04-16 NOTE — Progress Notes (Signed)
Pt sitting in the dayroom talking, laughing and putting together a puzzle. Pt sitting on her right leg. Staff asked pt do not sit on the leg if that is the leg the booth goes on to help with walking due to it may cause more swelling in the leg and she may not be able to stand and walk on the leg when needing to walk. Pt said her leg is fine. Her leg do not hurt.
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
## 2020-04-16 NOTE — [facility] Suicide Risk Assessment (Signed)
[facility] INPATIENT:  Family/Significant Other Suicide Prevention Education  Suicide Prevention Education:  Patient Refusal for Family/Significant Other Suicide Prevention Education: The patient Lauren Cross has refused to provide written consent for family/significant other to be provided Family/Significant Other Suicide Prevention Education during admission and/or prior to discharge.  Physician notified.  CSW completed SPE with the patient.  A pamphlet was placed in the patient's chart.    Metro Kung Trever Streater 04/16/2020, 9:45 AM
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
## 2020-04-16 NOTE — Progress Notes (Signed)
Pt sitting in the dayroom talking, laughing and putting together a puzzle. Pt sitting on her right leg. Staff asked pt do not sit on the leg if that is the leg the booth goes on to help with walking due to it may cause more swelling in the leg and she may not be able to stand and walk on the leg when needing to walk. Pt said her leg is fine. Her leg do not hurt. This was a second time staff remind pt. RN notify.

## 2020-04-16 NOTE — Progress Notes (Signed)
Recreation Therapy Notes  Date:  12.29.21 Time: 0930 Location: 300 Hall Dayroom  Group Topic: Stress Management  Goal Area(s) Addresses:  Patient will identify positive stress management techniques. Patient will identify benefits of using stress management post d/c.  Intervention: Stress Management  Activity: Meditation.  LRT played a meditation that focused on following and making your own goals and not living by the goals society has set.    Education:  Stress Management, Discharge Planning.   Education Outcome: Acknowledges Education  Clinical Observations/Feedback: Pt did not attend group session.    Caroll Rancher, LRT/CTRS         Caroll Rancher A 04/16/2020 10:58 AM

## 2020-04-16 NOTE — Progress Notes (Signed)
Nutrition Brief Note  Patient identified on the Malnutrition Screening Tool (MST) Report  Wt Readings from Last 15 Encounters:  04/15/20 65.8 kg  04/14/20 63.5 kg  07/19/19 70.3 kg  07/17/19 70.3 kg  11/21/18 66.6 kg  09/19/18 67.9 kg  04/04/18 64.2 kg  01/19/18 65.3 kg  01/17/18 64.4 kg  01/17/18 64.4 kg  12/25/17 65.8 kg  11/29/17 62.1 kg  08/14/17 63.5 kg  11/16/16 62.1 kg  11/04/16 62.6 kg    Body mass index is 24.13 kg/m. Patient meets criteria for normal based on current BMI.  Labs and medications reviewed.   No nutrition interventions warranted at this time. If nutrition issues arise, please consult RD.  Tilda Franco, MS, RD, LDN Inpatient Clinical Dietitian Contact information available via Amion

## 2020-04-16 NOTE — [facility] Group Notes (Signed)
Adult Psychoeducational Group Note  Date:  04/16/2020 Time:  9:09 PM  Group Topic/Focus:  Wrap-Up Group:   The focus of this group is to help patients review their daily goal of treatment and discuss progress on daily workbooks.  Participation Level:  Active  Participation Quality:  Intrusive, Monopolizing and Sharing  Affect:  Appropriate  Cognitive:  Alert and Appropriate  Insight: Appropriate and Good  Engagement in Group:  Distracting, Monopolizing and Off Topic  Modes of Intervention:  Discussion and Education  Additional Comments:  Pt attended and participated in wrapup group this evening and rated their day a 10/10, due to them having a good day, feeling good and learning a lot from their peers. While the pt is here they would like to learn better coping skills for managing their emotions.   Lauren Cross 04/16/2020, 9:09 PM

## 2020-04-16 NOTE — Progress Notes (Signed)
1:1 Note 1800  Patient has been sitting in her room most of the day, or laying in bed.  PT came to work with patient this afternoon.   Patient has lab work drawn.  Patient walker to lab area using 1:1, gait belt, walker.  Respirations even and unlabored.  No signs/symptoms of pain/distress noted on patient's face/body movements.   Patient did sit in dayroom later in the afternoon, talking to peers.  1:1 continues per MD order for safety.
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
## 2020-04-17 LAB — GLUCOSE, CAPILLARY: Glucose-Capillary: 105 mg/dL — ABNORMAL HIGH (ref 70–99)

## 2020-04-17 MED ORDER — ALPRAZOLAM 1 MG PO TABS: 1.0000 mg | ORAL_TABLET | Freq: Three times a day (TID) | ORAL | 0 refills | Status: DC | PRN

## 2020-04-17 MED ORDER — CEPHALEXIN 500 MG PO CAPS: 500.0000 mg | ORAL_CAPSULE | Freq: Three times a day (TID) | ORAL | 0 refills | Status: DC

## 2020-04-17 MED ORDER — NICOTINE 21 MG/24HR TD PT24: 21.0000 mg | MEDICATED_PATCH | Freq: Every day | TRANSDERMAL | 0 refills | Status: DC

## 2020-04-17 MED ORDER — SUMATRIPTAN SUCCINATE 50 MG PO TABS: 50.0000 mg | ORAL_TABLET | Freq: Every day | ORAL | 0 refills | Status: DC | PRN

## 2020-04-17 MED ORDER — TRAZODONE HCL 50 MG PO TABS: 50.0000 mg | ORAL_TABLET | Freq: Every evening | ORAL | 0 refills | Status: DC | PRN

## 2020-04-17 MED ORDER — GABAPENTIN 300 MG PO CAPS: 600.0000 mg | ORAL_CAPSULE | Freq: Three times a day (TID) | ORAL | 0 refills | Status: DC

## 2020-04-17 MED ORDER — OXCARBAZEPINE 600 MG PO TABS: 600.0000 mg | ORAL_TABLET | Freq: Two times a day (BID) | ORAL | 0 refills | Status: DC

## 2020-04-17 MED ORDER — HYDROXYZINE HCL 25 MG PO TABS
25.0000 mg | ORAL_TABLET | Freq: Three times a day (TID) | ORAL | 0 refills | Status: DC | PRN
Start: 2020-04-17 — End: 2021-07-22

## 2020-04-17 MED ORDER — FENOFIBRATE 40 MG PO TABS: 80.0000 mg | ORAL_TABLET | Freq: Every day | ORAL | 0 refills | Status: DC

## 2020-04-17 NOTE — Progress Notes (Signed)
  D: Patient lying in bed with her eyes closed. Respirations even and non-labored A: Appears to be sleeping. Respirations even and non-labored R: Staff will continue to monitor on 1:1 for safety due to high fall risk

## 2020-04-17 NOTE — Progress Notes (Signed)
   04/17/20 0028  Psych Admission Type (Psych Patients Only)  Admission Status Voluntary  Psychosocial Assessment  Patient Complaints Other (Comment) (medical issues)  Eye Contact Fair  Facial Expression Flat  Affect Appropriate to circumstance  Speech Loud  Interaction Assertive;Attention-seeking  Appearance/Hygiene Unremarkable  Behavior Characteristics Cooperative  Mood Pleasant  Thought Process  Coherency WDL  Content WDL  Delusions WDL  Perception WDL  Hallucination None reported or observed  Judgment WDL  Confusion WDL  Danger to Self  Current suicidal ideation? Denies  Danger to Others  Danger to Others None reported or observed  Patient focused on mostly medical issues tonight.

## 2020-04-17 NOTE — Progress Notes (Signed)
  D: Patient lying in bed with eyes closed. Respirations even and non-labored. A: Appears to be sleeping. No distress noted. R: Staff will monitor on 1:1 for safety due to high fall risk

## 2020-04-17 NOTE — Progress Notes (Signed)
  [facility] Adult Case Management Discharge Plan :  Will you be returning to the same living situation after discharge:  Yes,  personal home At discharge, do you have transportation home?: Yes,  via friend Do you have the ability to pay for your medications: Yes,  has medicaid  Release of information consent forms completed and in the chart;  Patient's signature needed at discharge.  Patient to Follow up at:  Follow-up Information    [facility] [place]. Go on 04/22/2020.   Why: You have an appointment on 04/22/20 at 11:00 am with Dr. Janeece Riggers for medication management services.  This appointment will be held in person. Contact information: [address] [place] [address] [phone]               Next level of care provider has access to [facility] Link:no  Safety Planning and Suicide Prevention discussed: No. Completed with pt.   Have you used any form of tobacco in the last 30 days? (Cigarettes, Smokeless Tobacco, Cigars, and/or Pipes): Yes  Has patient been referred to the Quitline?: Patient refused referral  Patient has been referred for addiction treatment: N/A  Felizardo Hoffmann, Theresia Majors 04/17/2020, 9:43 AM

## 2020-04-17 NOTE — [facility]HH Group Notes (Signed)
Adult Psychoeducational Group Note  Date:  04/17/2020 Time:  10:33 AM  Group Topic/Focus:  Goals Group:   The focus of this group is to help patients establish daily goals to achieve during treatment and discuss how the patient can incorporate goal setting into their daily lives to aide in recovery.  Participation Level:  Did Not Attend   Margaret Pyle 04/17/2020, 10:33 AM

## 2020-04-17 NOTE — Progress Notes (Signed)
RN met with pt and reviewed pt's discharge instructions.  Pt verbalized understanding of discharge instructions and pt did not have any questions. RN reviewed and provided pt with a copy of SRA, AVS and Transition Record.  RN returned pt's belongings to pt.  Pt denied SI/HI/AVH and voiced no concerns.  Pt was appreciative of the care pt received at [facility].  Patient discharged to the lobby without incident. 
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
## 2020-04-17 NOTE — Progress Notes (Signed)
1:1 Note:  Pt observed in her room talking with 1:1 MHT sitter. Pt is calm and cooperative. Pt Says she is being discharged today.  RN medicated pt and assessed for needs/concerns.  Pt is safe on the unit.

## 2020-04-17 NOTE — [facility] Suicide Risk Assessment (Signed)
[facility] Discharge Suicide Risk Assessment   Principal Problem: Severe recurrent major depression without psychotic features [facility]) Discharge Diagnoses: Principal Problem:   Severe recurrent major depression without psychotic features (HCC)   Total Time spent with patient: 20 minutes  Musculoskeletal: Strength & Muscle Tone: decreased Gait & Station: broad based Patient leans: N/A  Psychiatric Specialty Exam: Review of Systems  Constitutional: Positive for fatigue.  Respiratory: Positive for cough.   Musculoskeletal: Positive for arthralgias, back pain, gait problem, joint swelling and myalgias.  Neurological: Positive for seizures and weakness.  All other systems reviewed and are negative.   Blood pressure 114/90, pulse 80, temperature 97.9 F (36.6 C), temperature source Oral, resp. rate 18, height 5\' 5"  (1.651 m), weight 65.8 kg, last menstrual period 07/07/2019, SpO2 100 %.Body mass index is 24.13 kg/m.  General Appearance: Fairly Groomed  07/09/2019::  Fair  Speech:  Normal [phone]  Volume:  Normal  Mood:  Euthymic  Affect:  Congruent  Thought Process:  Coherent and Descriptions of Associations: Intact  Orientation:  Full (Time, Place, and Person)  Thought Content:  Logical  Suicidal Thoughts:  No  Homicidal Thoughts:  No  Memory:  Immediate;   Fair Recent;   Fair Remote;   Fair  Judgement:  Intact  Insight:  Fair  Psychomotor Activity:  Decreased  Concentration:  Fair  Recall:  [id number] of Knowledge:Good  Language: Fair  Akathisia:  Negative  Handed:  Right  AIMS (if indicated):     Assets:  Desire for Improvement Resilience  Sleep:     Cognition: WNL  ADL's:  Impaired   Mental Status Per Nursing Assessment::   On Admission:  NA  Demographic Factors:  Divorced or widowed, Caucasian, Low socioeconomic status and Unemployed  Loss Factors: Loss of significant relationship and Financial problems/change in socioeconomic status  Historical  Factors: Impulsivity  Risk Reduction Factors:   Positive coping skills or problem solving skills  Continued Clinical Symptoms:  Depression:   Impulsivity More than one psychiatric diagnosis Previous Psychiatric Diagnoses and Treatments Medical Diagnoses and Treatments/Surgeries  Cognitive Features That Contribute To Risk:  None    Suicide Risk:  Minimal: No identifiable suicidal ideation.  Patients presenting with no risk factors but with morbid ruminations; may be classified as minimal risk based on the severity of the depressive symptoms   Follow-up Information    Care, [phone] Behavioral. Go on 04/22/2020.   Why: You have an appointment on 04/22/20 at 11:00 am with Dr. 06/20/20 for medication management services.  This appointment will be held in person. Contact information: [address] [place] [phone]               Plan Of Care/Follow-up recommendations:  Activity:  ad lib  [phone], MD 04/17/2020, 11:10 AM

## 2020-05-06 ENCOUNTER — Other Ambulatory Visit: Payer: Self-pay | Admitting: *Deleted

## 2020-05-06 DIAGNOSIS — I6523 Occlusion and stenosis of bilateral carotid arteries: Secondary | ICD-10-CM

## 2020-05-12 ENCOUNTER — Ambulatory Visit (INDEPENDENT_AMBULATORY_CARE_PROVIDER_SITE_OTHER): Payer: Medicaid Other

## 2020-05-12 ENCOUNTER — Encounter: Payer: Medicaid Other | Admitting: Vascular Surgery

## 2020-05-12 ENCOUNTER — Ambulatory Visit (INDEPENDENT_AMBULATORY_CARE_PROVIDER_SITE_OTHER): Payer: Medicaid Other | Admitting: Vascular Surgery

## 2020-05-12 ENCOUNTER — Encounter: Payer: Self-pay | Admitting: Vascular Surgery

## 2020-05-12 ENCOUNTER — Other Ambulatory Visit: Payer: Self-pay

## 2020-05-12 VITALS — BP 110/75 | HR 90 | Temp 97.7°F | Resp 16 | Ht 65.0 in | Wt 143.0 lb

## 2020-05-12 DIAGNOSIS — I6523 Occlusion and stenosis of bilateral carotid arteries: Secondary | ICD-10-CM
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
## 2020-05-29 ENCOUNTER — Other Ambulatory Visit (HOSPITAL_COMMUNITY): Payer: Self-pay | Admitting: Psychiatry

## 2020-09-04 ENCOUNTER — Encounter: Payer: Self-pay | Admitting: Internal Medicine

## 2020-09-29 ENCOUNTER — Other Ambulatory Visit (HOSPITAL_COMMUNITY): Payer: Self-pay | Admitting: Emergency Medicine

## 2020-09-29 DIAGNOSIS — I6523 Occlusion and stenosis of bilateral carotid arteries: Secondary | ICD-10-CM
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
## 2021-01-14 ENCOUNTER — Ambulatory Visit: Payer: Self-pay | Admitting: Gastroenterology

## 2021-01-14 ENCOUNTER — Encounter: Payer: Self-pay | Admitting: Gastroenterology

## 2021-03-17 DIAGNOSIS — G8929 Other chronic pain: Secondary | ICD-10-CM | POA: Insufficient documentation

## 2021-03-17 DIAGNOSIS — M899 Disorder of bone, unspecified: Secondary | ICD-10-CM | POA: Insufficient documentation

## 2021-03-17 DIAGNOSIS — Z79899 Other long term (current) drug therapy: Secondary | ICD-10-CM | POA: Insufficient documentation

## 2021-03-17 DIAGNOSIS — G894 Chronic pain syndrome: Secondary | ICD-10-CM | POA: Insufficient documentation

## 2021-03-17 DIAGNOSIS — Z789 Other specified health status: Secondary | ICD-10-CM | POA: Insufficient documentation

## 2021-03-17 NOTE — Progress Notes (Deleted)
No-show to initial evaluation on 03/18/2021.

## 2021-03-18 ENCOUNTER — Ambulatory Visit: Payer: Self-pay | Admitting: Pain Medicine

## 2021-03-18 DIAGNOSIS — M150 Primary generalized (osteo)arthritis: Secondary | ICD-10-CM | POA: Insufficient documentation

## 2021-03-18 DIAGNOSIS — S82001G Unspecified fracture of right patella, subsequent encounter for closed fracture with delayed healing: Secondary | ICD-10-CM | POA: Insufficient documentation

## 2021-03-18 DIAGNOSIS — G8929 Other chronic pain: Secondary | ICD-10-CM

## 2021-03-18 DIAGNOSIS — K2970 Gastritis, unspecified, without bleeding: Secondary | ICD-10-CM | POA: Insufficient documentation

## 2021-03-18 DIAGNOSIS — J324 Chronic pansinusitis: Secondary | ICD-10-CM | POA: Insufficient documentation

## 2021-03-18 DIAGNOSIS — I10 Essential (primary) hypertension: Secondary | ICD-10-CM | POA: Insufficient documentation

## 2021-03-18 DIAGNOSIS — K2940 Chronic atrophic gastritis without bleeding: Secondary | ICD-10-CM | POA: Insufficient documentation

## 2021-03-18 DIAGNOSIS — S129XXD Fracture of neck, unspecified, subsequent encounter: Secondary | ICD-10-CM | POA: Insufficient documentation

## 2021-03-18 DIAGNOSIS — N390 Urinary tract infection, site not specified: Secondary | ICD-10-CM | POA: Insufficient documentation

## 2021-03-18 DIAGNOSIS — J329 Chronic sinusitis, unspecified: Secondary | ICD-10-CM | POA: Insufficient documentation

## 2021-03-18 DIAGNOSIS — M5126 Other intervertebral disc displacement, lumbar region: Secondary | ICD-10-CM | POA: Insufficient documentation

## 2021-03-18 DIAGNOSIS — I6523 Occlusion and stenosis of bilateral carotid arteries: Secondary | ICD-10-CM | POA: Insufficient documentation

## 2021-03-18 DIAGNOSIS — Z6821 Body mass index (BMI) 21.0-21.9, adult: Secondary | ICD-10-CM | POA: Insufficient documentation

## 2021-03-18 DIAGNOSIS — F319 Bipolar disorder, unspecified: Secondary | ICD-10-CM | POA: Insufficient documentation

## 2021-03-18 DIAGNOSIS — G894 Chronic pain syndrome: Secondary | ICD-10-CM

## 2021-03-18 DIAGNOSIS — R32 Unspecified urinary incontinence: Secondary | ICD-10-CM | POA: Insufficient documentation

## 2021-03-18 DIAGNOSIS — K7460 Unspecified cirrhosis of liver: Secondary | ICD-10-CM | POA: Insufficient documentation

## 2021-03-18 DIAGNOSIS — R3911 Hesitancy of micturition: Secondary | ICD-10-CM | POA: Insufficient documentation

## 2021-03-18 DIAGNOSIS — Z79899 Other long term (current) drug therapy: Secondary | ICD-10-CM

## 2021-03-18 DIAGNOSIS — K859 Acute pancreatitis without necrosis or infection, unspecified: Secondary | ICD-10-CM | POA: Insufficient documentation

## 2021-03-18 DIAGNOSIS — M899 Disorder of bone, unspecified: Secondary | ICD-10-CM

## 2021-03-18 DIAGNOSIS — N921 Excessive and frequent menstruation with irregular cycle: Secondary | ICD-10-CM | POA: Insufficient documentation

## 2021-03-18 DIAGNOSIS — Z720 Tobacco use: Secondary | ICD-10-CM | POA: Insufficient documentation

## 2021-03-18 DIAGNOSIS — M47817 Spondylosis without myelopathy or radiculopathy, lumbosacral region: Secondary | ICD-10-CM | POA: Insufficient documentation

## 2021-03-18 DIAGNOSIS — Z309 Encounter for contraceptive management, unspecified: Secondary | ICD-10-CM | POA: Insufficient documentation

## 2021-03-18 DIAGNOSIS — Z789 Other specified health status: Secondary | ICD-10-CM

## 2021-03-18 DIAGNOSIS — J0100 Acute maxillary sinusitis, unspecified: Secondary | ICD-10-CM | POA: Insufficient documentation

## 2021-03-18 DIAGNOSIS — M179 Osteoarthritis of knee, unspecified: Secondary | ICD-10-CM | POA: Insufficient documentation

## 2021-03-18 DIAGNOSIS — J342 Deviated nasal septum: Secondary | ICD-10-CM | POA: Insufficient documentation

## 2021-03-18 DIAGNOSIS — M5430 Sciatica, unspecified side: Secondary | ICD-10-CM | POA: Insufficient documentation

## 2021-03-18 DIAGNOSIS — K760 Fatty (change of) liver, not elsewhere classified: Secondary | ICD-10-CM | POA: Insufficient documentation

## 2021-03-18 DIAGNOSIS — Z8541 Personal history of malignant neoplasm of cervix uteri: Secondary | ICD-10-CM | POA: Insufficient documentation

## 2021-03-18 DIAGNOSIS — Z8719 Personal history of other diseases of the digestive system: Secondary | ICD-10-CM | POA: Insufficient documentation

## 2021-03-18 DIAGNOSIS — M5136 Other intervertebral disc degeneration, lumbar region: Secondary | ICD-10-CM | POA: Insufficient documentation

## 2021-03-18 DIAGNOSIS — F321 Major depressive disorder, single episode, moderate: Secondary | ICD-10-CM | POA: Insufficient documentation

## 2021-03-18 DIAGNOSIS — M6750 Plica syndrome, unspecified knee: Secondary | ICD-10-CM | POA: Insufficient documentation

## 2021-03-18 DIAGNOSIS — B070 Plantar wart: Secondary | ICD-10-CM | POA: Insufficient documentation

## 2021-05-15 ENCOUNTER — Encounter: Payer: Self-pay | Admitting: Radiology

## 2021-06-01 ENCOUNTER — Encounter: Payer: Self-pay | Admitting: Orthopedic Surgery

## 2021-06-01 ENCOUNTER — Ambulatory Visit: Payer: Medicaid Other | Admitting: Orthopedic Surgery

## 2021-06-05 ENCOUNTER — Ambulatory Visit: Payer: Medicaid Other | Admitting: Orthopedic Surgery

## 2021-07-01 ENCOUNTER — Emergency Department (HOSPITAL_COMMUNITY): Payer: Medicaid Other

## 2021-07-01 ENCOUNTER — Encounter (HOSPITAL_COMMUNITY): Payer: Self-pay

## 2021-07-01 ENCOUNTER — Emergency Department (HOSPITAL_COMMUNITY)
Admission: EM | Admit: 2021-07-01 | Discharge: 2021-07-01 | Disposition: A | Discharge status: Home / Self Care | Payer: Medicaid Other | Attending: Emergency Medicine | Admitting: Emergency Medicine

## 2021-07-01 ENCOUNTER — Other Ambulatory Visit: Payer: Self-pay

## 2021-07-01 DIAGNOSIS — R197 Diarrhea, unspecified: Secondary | ICD-10-CM | POA: Insufficient documentation

## 2021-07-01 DIAGNOSIS — M542 Cervicalgia: Secondary | ICD-10-CM | POA: Insufficient documentation

## 2021-07-01 DIAGNOSIS — Z7982 Long term (current) use of aspirin: Secondary | ICD-10-CM | POA: Insufficient documentation

## 2021-07-01 DIAGNOSIS — D72829 Elevated white blood cell count, unspecified: Secondary | ICD-10-CM | POA: Insufficient documentation

## 2021-07-01 DIAGNOSIS — Y9301 Activity, walking, marching and hiking: Secondary | ICD-10-CM | POA: Diagnosis not present

## 2021-07-01 DIAGNOSIS — Z79899 Other long term (current) drug therapy: Secondary | ICD-10-CM | POA: Diagnosis not present

## 2021-07-01 DIAGNOSIS — R000 Tachycardia, unspecified: Secondary | ICD-10-CM | POA: Diagnosis not present

## 2021-07-01 DIAGNOSIS — R1013 Epigastric pain: Secondary | ICD-10-CM | POA: Insufficient documentation

## 2021-07-01 DIAGNOSIS — W19XXXA Unspecified fall, initial encounter: Secondary | ICD-10-CM

## 2021-07-01 DIAGNOSIS — Z7901 Long term (current) use of anticoagulants: Secondary | ICD-10-CM | POA: Insufficient documentation

## 2021-07-01 DIAGNOSIS — W109XXA Fall (on) (from) unspecified stairs and steps, initial encounter: Secondary | ICD-10-CM | POA: Insufficient documentation

## 2021-07-01 DIAGNOSIS — Z8541 Personal history of malignant neoplasm of cervix uteri: Secondary | ICD-10-CM | POA: Insufficient documentation

## 2021-07-01 DIAGNOSIS — J029 Acute pharyngitis, unspecified: Secondary | ICD-10-CM | POA: Insufficient documentation

## 2021-07-01 DIAGNOSIS — I10 Essential (primary) hypertension: Secondary | ICD-10-CM | POA: Insufficient documentation

## 2021-07-01 DIAGNOSIS — R1011 Right upper quadrant pain: Secondary | ICD-10-CM | POA: Diagnosis present

## 2021-07-01 LAB — URINALYSIS, ROUTINE W REFLEX MICROSCOPIC
Bacteria, UA: NONE SEEN
Bilirubin Urine: NEGATIVE
Glucose, UA: NEGATIVE mg/dL
Hgb urine dipstick: NEGATIVE
Ketones, ur: NEGATIVE mg/dL
Leukocytes,Ua: NEGATIVE
Nitrite: NEGATIVE
Protein, ur: 30 mg/dL — AB
Specific Gravity, Urine: 1.014 (ref 1.005–1.030)
pH: 5 (ref 5.0–8.0)

## 2021-07-01 LAB — COMPREHENSIVE METABOLIC PANEL
ALT: 19 U/L (ref 0–44)
AST: 18 U/L (ref 15–41)
Albumin: 3.8 g/dL (ref 3.5–5.0)
Alkaline Phosphatase: 79 U/L (ref 38–126)
Anion gap: 12 (ref 5–15)
BUN: 11 mg/dL (ref 6–20)
CO2: 20 mmol/L — ABNORMAL LOW (ref 22–32)
Calcium: 9.5 mg/dL (ref 8.9–10.3)
Chloride: 106 mmol/L (ref 98–111)
Creatinine, Ser: 0.94 mg/dL (ref 0.44–1.00)
GFR, Estimated: >60 mL/min (ref >60)
Glucose, Bld: 90 mg/dL (ref 70–99)
Potassium: 4.2 mmol/L (ref 3.5–5.1)
Sodium: 138 mmol/L (ref 135–145)
Total Bilirubin: 0.4 mg/dL (ref 0.3–1.2)
Total Protein: 7 g/dL (ref 6.5–8.1)

## 2021-07-01 LAB — CBC WITH DIFFERENTIAL/PLATELET
Abs Immature Granulocytes: 0.1 10*3/uL — ABNORMAL HIGH (ref 0.00–0.07)
Basophils Absolute: 0.1 10*3/uL (ref 0.0–0.1)
Basophils Relative: 1 %
Eosinophils Absolute: 0.3 10*3/uL (ref 0.0–0.5)
Eosinophils Relative: 3 %
HCT: 41 % (ref 36.0–46.0)
Hemoglobin: 13.1 g/dL (ref 12.0–15.0)
Immature Granulocytes: 1 %
Lymphocytes Relative: 30 %
Lymphs Abs: 3.7 10*3/uL (ref 0.7–4.0)
MCH: 30 pg (ref 26.0–34.0)
MCHC: 32 g/dL (ref 30.0–36.0)
MCV: 93.8 fL (ref 80.0–100.0)
Monocytes Absolute: 0.7 10*3/uL (ref 0.1–1.0)
Monocytes Relative: 6 %
Neutro Abs: 7.3 10*3/uL (ref 1.7–7.7)
Neutrophils Relative %: 59 %
Platelets: 352 10*3/uL (ref 150–400)
RBC: 4.37 MIL/uL (ref 3.87–5.11)
RDW: 13.9 % (ref 11.5–15.5)
WBC: 12.2 10*3/uL — ABNORMAL HIGH (ref 4.0–10.5)
nRBC: 0 % (ref 0.0–0.2)

## 2021-07-01 LAB — PREGNANCY, URINE: Preg Test, Ur: NEGATIVE

## 2021-07-01 LAB — GROUP A STREP BY PCR: Group A Strep by PCR: NOT DETECTED

## 2021-07-01 LAB — LIPASE, BLOOD: Lipase: 37 U/L (ref 11–51)

## 2021-07-01 MED ORDER — IBUPROFEN 600 MG PO TABS: 600.0000 mg | ORAL_TABLET | Freq: Four times a day (QID) | ORAL | 0 refills | Status: AC | PRN

## 2021-07-01 MED ORDER — IBUPROFEN 400 MG PO TABS
600.0000 mg | ORAL_TABLET | Freq: Once | ORAL | Status: AC
  Administered 2021-07-01: 600 mg via ORAL
  Filled 2021-07-01: qty 2

## 2021-07-01 NOTE — ED Triage Notes (Signed)
Patient complaining of fall 3 days prior with pain and brusing to RUQ and takes blood thinners but is unsure what kind.  Also has sore throat.  ?

## 2021-07-01 NOTE — Discharge Instructions (Addendum)
Follow-up with your primary care within the next 2 to 3 days as discussed for continued medical management and reevaluation ? ?Return to the ED for new or worsening symptoms as discussed ?

## 2021-07-01 NOTE — ED Notes (Signed)
Patient [place] at this time.  ?

## 2021-07-01 NOTE — ED Provider Notes (Signed)
?[facility] ?Provider Note ? ? ?CSN: [id number] ?Arrival date & time: 07/01/21  1424 ? ?  ? ?History ? ?Chief Complaint  ?Patient presents with  ? Fall  ? ? ?Lauren Cross is a 46 y.o. female with a history of liver cirrhosis, seizure disorder, and hx of pancreatitis presenting today with right upper quadrant abdominal pain after sustaining a fall 3 days ago.  Patient states she was walking up the steps, missed a step, and fell forwards.  Denies hitting her head or any loss of consciousness.  Fall was witnessed by family member who confirmed this to the patient.  Currently complaining of abdominal tenderness with a palpable lump and surrounding ecchymosis.  Denies N/V, constipation.  Denies pain radiating from the middle of her stomach to the back.  Has been experiencing diarrhea for the last 4 days and has only been able to consume boost meal replacements.  Currently on plavix.   ? ?Also states family member has been symptomatic and tested positive for strep throat.  Patient states her throat is now sore and is complaining of painful swallowing.  Denies cough, runny nose, fever, or postnasal drip.  Still able to swallow food and liquid without physical difficulty.  Throat soreness is bilateral.  Denies ear pain or changes in hearing. ? ?Current tobacco use.   ? ?Complex history including HTN, GERD, bipolar disorder, anxiety, depression, Hx malignancy of cervix, Hx leukemia, migraines, constipation, PTSD, hyperlipidemia, fibromyalgia, and neuropathy. ? ?The history is provided by the patient and medical records.  ?Fall ?Associated symptoms include abdominal pain.  ? ?  ? ?Home Medications ?Prior to Admission medications   ?Medication Sig Start Date End Date Taking? Authorizing Provider  ?albuterol (PROVENTIL HFA;VENTOLIN HFA) 108 (90 BASE) MCG/ACT inhaler Inhale 1-2 puffs into the lungs every 6 (six) hours as needed for wheezing or shortness of breath. 08/23/13   Kem Parkinson, PA-C  ?ALPRAZolam  (XANAX) 1 MG tablet Take 1 tablet (1 mg total) by mouth 3 (three) times daily as needed for anxiety. 04/17/20   Lindell Spar I, NP  ?aspirin EC 81 MG tablet Take 81 mg by mouth daily.    [provider]  ?azelastine (ASTELIN) 0.1 % nasal spray Place 2 sprays into both nostrils 2 (two) times daily.     [provider]  ?baclofen (LIORESAL) 20 MG tablet Take 20 mg by mouth every 6 (six) hours.  10/25/18   [provider]  ?cephALEXin (KEFLEX) 500 MG capsule Take 1 capsule (500 mg total) by mouth every 8 (eight) hours. For infection 04/17/20   Encarnacion Slates, NP  ?Cholecalciferol (VITAMIN D) 125 MCG (5000 UT) CAPS Take 1 capsule by mouth daily. 12/17/19   [provider]  ?clopidogrel (PLAVIX) 75 MG tablet Take 75 mg by mouth daily.    [provider]  ?DULoxetine (CYMBALTA) 30 MG capsule Take 30 mg by mouth daily. 04/14/20   [provider]  ?DULoxetine (CYMBALTA) 60 MG capsule Take 60 mg by mouth daily.    [provider]  ?fenofibrate 40 MG TABS Take 2 tablets (80 mg total) by mouth daily. For high cholesterol 04/18/20   Lindell Spar I, NP  ?FEROSUL 325 (65 Fe) MG tablet Take 325 mg by mouth 2 (two) times daily. 12/17/19   [provider]  ?gabapentin (NEURONTIN) 300 MG capsule Take 2 capsules (600 mg total) by mouth 3 (three) times daily. For agitation/pain 04/17/20   Lindell Spar I, NP  ?hydrOXYzine (ATARAX/VISTARIL)  25 MG tablet Take 1 tablet (25 mg total) by mouth 3 (three) times daily as needed for anxiety. 04/17/20   Lindell Spar I, NP  ?ibuprofen (ADVIL) 600 MG tablet Take 1 tablet (600 mg total) by mouth every 6 (six) hours as needed for up to 10 days. 07/01/21 9/93/57  Prince Rome, PA-C  ?levETIRAcetam (KEPPRA) 500 MG tablet Take 500 mg by mouth 2 (two) times daily.    [provider]  ?nicotine (NICODERM CQ - DOSED IN MG/24 HOURS) 21 mg/24hr patch Place 1 patch (21 mg total) onto the skin daily. (May buy from over the  counter: For smoking cessation 04/18/20   Lindell Spar I, NP  ?oxcarbazepine (TRILEPTAL) 600 MG tablet Take 1 tablet (600 mg total) by mouth 2 (two) times daily. For mood stabilization 04/17/20   Lindell Spar I, NP  ?pantoprazole (PROTONIX) 40 MG tablet TAKE (1) TABLET BY MOUTH TWICE A DAY BEFORE MEALS. (BREAKFAST AND SUPPER) ?Patient taking differently: Take 40 mg by mouth 2 (two) times daily before a meal. 04/19/19   Mahala Menghini, PA-C  ?SUMAtriptan (IMITREX) 50 MG tablet Take 1 tablet (50 mg total) by mouth daily as needed for migraine or headache (first dose now). May repeat in 2 hours if headache persists or recurs. 04/17/20   Lindell Spar I, NP  ?traZODone (DESYREL) 50 MG tablet Take 1 tablet (50 mg total) by mouth at bedtime as needed for sleep. 04/17/20   Encarnacion Slates, NP  ?UBRELVY 100 MG TABS Take 100 mg by mouth daily as needed. 04/04/20   [provider]  ?   ? ?Allergies    ?Zetia [ezetimibe], Zofran [ondansetron hcl], Hydromorphone, Moviprep [peg-kcl-nacl-nasulf-na asc-c], Oxycodone, Tape, and Codeine   ? ?Review of Systems   ?Review of Systems  ?Gastrointestinal:  Positive for abdominal pain and diarrhea.  ?Hematological:  Bruises/bleeds easily.  ? ?Physical Exam ?Updated Vital Signs ?BP 107/78 (BP Location: Right Arm)   Pulse 88   Temp 98 ?F (36.7 ?C) (Oral)   Resp 14   Ht '5\' 5"'$  (1.651 m)   Wt 63.5 kg   LMP 07/07/2019 (Approximate)   SpO2 97%   BMI 23.30 kg/m?  ?Physical Exam ?Vitals and nursing note reviewed.  ?Constitutional:   ?   General: She is not in acute distress. ?   Appearance: Normal appearance. She is well-developed. She is not ill-appearing or diaphoretic.  ?HENT:  ?   Head: Normocephalic and atraumatic.  ?   Mouth/Throat:  ?   Mouth: Mucous membranes are moist.  ?   Tongue: Tongue does not deviate from midline.  ?   Palate: No mass and lesions.  ?   Pharynx: Oropharynx is clear. Uvula midline. No pharyngeal swelling, oropharyngeal exudate, posterior oropharyngeal  erythema or uvula swelling.  ?   Tonsils: No tonsillar exudate or tonsillar abscesses.  ?Eyes:  ?   Conjunctiva/sclera: Conjunctivae normal.  ?Cardiovascular:  ?   Rate and Rhythm: Regular rhythm. Tachycardia present.  ?   Pulses: Normal pulses.     ?     Radial pulses are 2+ on the right side and 2+ on the left side.  ?     Dorsalis pedis pulses are 2+ on the right side and 2+ on the left side.  ?   Heart sounds: Normal heart sounds. No murmur heard. ?Pulmonary:  ?   Effort: Pulmonary effort is normal. No respiratory distress.  ?   Breath sounds: Normal breath sounds.  ?Abdominal:  ?  General: Bowel sounds are normal.  ?   Palpations: Abdomen is soft.  ?   Tenderness: There is abdominal tenderness (RUQ and epigastric). There is right CVA tenderness and left CVA tenderness. There is no guarding.  ? ? ?   Comments: Outer circle represents bruising and ecchymosis ?Inner circle represents palpable nodule ?Possible shifting dullness  ?Musculoskeletal:     ?   General: No swelling.  ?   Cervical back: Neck supple. Tenderness (Bilateral, anterior) present.  ?   Right lower leg: No edema.  ?   Left lower leg: No edema.  ?Lymphadenopathy:  ?   Cervical: No cervical adenopathy.  ?Skin: ?   General: Skin is warm and dry.  ?   Capillary Refill: Capillary refill takes less than 2 seconds.  ?Neurological:  ?   Mental Status: She is alert.  ?Psychiatric:     ?   Mood and Affect: Mood normal.  ? ? ?ED Results / Procedures / Treatments   ?Labs ?(all labs ordered are listed, but only abnormal results are displayed) ?Labs Reviewed  ?CBC WITH DIFFERENTIAL/PLATELET - Abnormal; Notable for the following components:  ?    Result Value  ? WBC 12.2 (*)   ? Abs Immature Granulocytes 0.10 (*)   ? All other components within normal limits  ?COMPREHENSIVE METABOLIC PANEL - Abnormal; Notable for the following components:  ? CO2 20 (*)   ? All other components within normal limits  ?URINALYSIS, ROUTINE W REFLEX MICROSCOPIC - Abnormal; Notable  for the following components:  ? Protein, ur 30 (*)   ? All other components within normal limits  ?GROUP A STREP BY PCR  ?LIPASE, BLOOD  ?PREGNANCY, URINE  ? ? ?EKG ?None ? ?Radiology ?US Abdomen Complete ? ?Result D

## 2021-07-03 ENCOUNTER — Encounter (HOSPITAL_COMMUNITY): Payer: Self-pay | Admitting: Emergency Medicine

## 2021-07-20 ENCOUNTER — Observation Stay (HOSPITAL_COMMUNITY)
Admission: EM | Admit: 2021-07-20 | Discharge: 2021-07-22 | Disposition: A | Discharge status: Home / Self Care | Payer: Medicaid Other | Attending: Internal Medicine | Admitting: Internal Medicine

## 2021-07-20 ENCOUNTER — Other Ambulatory Visit: Payer: Self-pay

## 2021-07-20 ENCOUNTER — Emergency Department (HOSPITAL_COMMUNITY): Payer: Medicaid Other

## 2021-07-20 ENCOUNTER — Encounter (HOSPITAL_COMMUNITY): Payer: Self-pay | Admitting: Emergency Medicine

## 2021-07-20 DIAGNOSIS — Z7984 Long term (current) use of oral hypoglycemic drugs: Secondary | ICD-10-CM | POA: Insufficient documentation

## 2021-07-20 DIAGNOSIS — R4182 Altered mental status, unspecified: Secondary | ICD-10-CM

## 2021-07-20 DIAGNOSIS — Z7901 Long term (current) use of anticoagulants: Secondary | ICD-10-CM | POA: Diagnosis not present

## 2021-07-20 DIAGNOSIS — R569 Unspecified convulsions: Secondary | ICD-10-CM

## 2021-07-20 DIAGNOSIS — Z8541 Personal history of malignant neoplasm of cervix uteri: Secondary | ICD-10-CM | POA: Diagnosis not present

## 2021-07-20 DIAGNOSIS — Z87891 Personal history of nicotine dependence: Secondary | ICD-10-CM | POA: Diagnosis not present

## 2021-07-20 DIAGNOSIS — Z7982 Long term (current) use of aspirin: Secondary | ICD-10-CM | POA: Insufficient documentation

## 2021-07-20 DIAGNOSIS — Z720 Tobacco use: Secondary | ICD-10-CM | POA: Diagnosis present

## 2021-07-20 DIAGNOSIS — H0520 Unspecified exophthalmos: Secondary | ICD-10-CM | POA: Diagnosis not present

## 2021-07-20 DIAGNOSIS — G9341 Metabolic encephalopathy: Principal | ICD-10-CM | POA: Insufficient documentation

## 2021-07-20 DIAGNOSIS — R7303 Prediabetes: Secondary | ICD-10-CM | POA: Diagnosis not present

## 2021-07-20 DIAGNOSIS — J45909 Unspecified asthma, uncomplicated: Secondary | ICD-10-CM | POA: Insufficient documentation

## 2021-07-20 DIAGNOSIS — F319 Bipolar disorder, unspecified: Secondary | ICD-10-CM | POA: Diagnosis present

## 2021-07-20 DIAGNOSIS — N179 Acute kidney failure, unspecified: Secondary | ICD-10-CM | POA: Diagnosis not present

## 2021-07-20 DIAGNOSIS — J9601 Acute respiratory failure with hypoxia: Secondary | ICD-10-CM | POA: Diagnosis not present

## 2021-07-20 DIAGNOSIS — E876 Hypokalemia: Secondary | ICD-10-CM | POA: Insufficient documentation

## 2021-07-20 LAB — CBG MONITORING, ED: Glucose-Capillary: 97 mg/dL (ref 70–99)

## 2021-07-20 MED ORDER — LACTATED RINGERS IV BOLUS
1000.0000 mL | Freq: Once | INTRAVENOUS | Status: AC
  Administered 2021-07-20: 1000 mL via INTRAVENOUS

## 2021-07-20 NOTE — ED Notes (Signed)
Patient is aware of location, she was unable to provide the correct date, and how she was transported to the hospital. ?

## 2021-07-20 NOTE — ED Triage Notes (Signed)
Pt has been altered since fall 3 days ago. Tonight pt was found in the bathroom tonight, pt's mom unsure if pt had a seizure or not.  ?

## 2021-07-20 NOTE — ED Provider Notes (Signed)
?[facility] ?Provider Note ? ? ?CSN: [id number] ?Arrival date & time: 07/20/21  2311 ? ?  ? ?History ? ?Chief Complaint  ?Patient presents with  ? Altered Mental Status  ? ? ?Lauren Cross is a 46 y.o. female. ? ?46 yo F who had a fall a few days ago. She states that she had some arm weakness at that time. Reportedly not acting normal since time either. Tonight mother found her on the floor and altered more than usual. Called EMS. VS WNL en route. Seemed possibly post ictal.  ? ?Review of medications is on mulitple sedating medications related to seizures? Depression? ? ? ?Altered Mental Status ? ?  ? ?Home Medications ?Prior to Admission medications   ?Medication Sig Start Date End Date Taking? Authorizing Provider  ?albuterol (PROVENTIL HFA;VENTOLIN HFA) 108 (90 BASE) MCG/ACT inhaler Inhale 1-2 puffs into the lungs every 6 (six) hours as needed for wheezing or shortness of breath. 08/23/13   Kem Parkinson, PA-C  ?ALPRAZolam (XANAX) 1 MG tablet Take 1 tablet (1 mg total) by mouth 3 (three) times daily as needed for anxiety. 04/17/20   Lindell Spar I, NP  ?aspirin EC 81 MG tablet Take 81 mg by mouth daily.    [provider]  ?azelastine (ASTELIN) 0.1 % nasal spray Place 2 sprays into both nostrils 2 (two) times daily.     [provider]  ?baclofen (LIORESAL) 20 MG tablet Take 20 mg by mouth every 6 (six) hours.  10/25/18   [provider]  ?cephALEXin (KEFLEX) 500 MG capsule Take 1 capsule (500 mg total) by mouth every 8 (eight) hours. For infection 04/17/20   Encarnacion Slates, NP  ?Cholecalciferol (VITAMIN D) 125 MCG (5000 UT) CAPS Take 1 capsule by mouth daily. 12/17/19   [provider]  ?clopidogrel (PLAVIX) 75 MG tablet Take 75 mg by mouth daily.    [provider]  ?DULoxetine (CYMBALTA) 30 MG capsule Take 30 mg by mouth daily. 04/14/20   [provider]  ?DULoxetine (CYMBALTA) 60 MG capsule Take 60 mg by mouth daily.    [provider]  ?fenofibrate 40 MG TABS Take 2 tablets (80 mg total) by mouth daily. For high cholesterol 04/18/20   Lindell Spar I, NP  ?FEROSUL 325 (65 Fe) MG tablet Take 325 mg by mouth 2 (two) times daily. 12/17/19   [provider]  ?gabapentin (NEURONTIN) 300 MG capsule Take 2 capsules (600 mg total) by mouth 3 (three) times daily. For agitation/pain 04/17/20   Lindell Spar I, NP  ?hydrOXYzine (ATARAX/VISTARIL) 25 MG tablet Take 1 tablet (25 mg total) by mouth 3 (three) times daily as needed for anxiety. 04/17/20   Lindell Spar I, NP  ?levETIRAcetam (KEPPRA) 500 MG tablet Take 500 mg by mouth 2 (two) times daily.    [provider]  ?nicotine (NICODERM CQ - DOSED IN MG/24 HOURS) 21 mg/24hr patch Place 1 patch (21 mg total) onto the skin daily. (May buy from over the counter: For smoking cessation 04/18/20   Lindell Spar I, NP  ?oxcarbazepine (TRILEPTAL) 600 MG tablet Take 1 tablet (600 mg total) by mouth 2 (two) times daily. For mood stabilization 04/17/20   Lindell Spar I, NP  ?pantoprazole (PROTONIX) 40 MG tablet TAKE (1) TABLET BY MOUTH TWICE A DAY BEFORE MEALS. (BREAKFAST AND SUPPER) ?Patient taking differently: Take 40 mg by mouth 2 (two) times daily before a meal. 04/19/19   Mahala Menghini, PA-C  ?SUMAtriptan (IMITREX)  50 MG tablet Take 1 tablet (50 mg total) by mouth daily as needed for migraine or headache (first dose now). May repeat in 2 hours if headache persists or recurs. 04/17/20   Lindell Spar I, NP  ?traZODone (DESYREL) 50 MG tablet Take 1 tablet (50 mg total) by mouth at bedtime as needed for sleep. 04/17/20   Encarnacion Slates, NP  ?UBRELVY 100 MG TABS Take 100 mg by mouth daily as needed. 04/04/20   [provider]  ?   ? ?Allergies    ?Zetia [ezetimibe], Zofran [ondansetron hcl], Hydromorphone, Moviprep [peg-kcl-nacl-nasulf-na asc-c], Oxycodone, Tape, and Codeine   ? ?Review of Systems   ?Review of Systems ? ?Physical Exam ?Updated Vital Signs ?BP (!) 74/45   Pulse 97   Temp  98 ?F (36.7 ?C) (Oral)   Resp 20   Ht '5\' 5"'$  (1.651 m)   Wt 63.5 kg   LMP 07/07/2019 (Approximate)   SpO2 (!) 83%   BMI 23.30 kg/m?  ?Physical Exam ?Vitals and nursing note reviewed.  ?Constitutional:   ?   Appearance: She is well-developed.  ?HENT:  ?   Head: Normocephalic and atraumatic.  ?   Nose: Nose normal. No congestion.  ?   Mouth/Throat:  ?   Mouth: Mucous membranes are moist.  ?   Pharynx: Oropharynx is clear.  ?Eyes:  ?   Pupils: Pupils are equal, round, and reactive to light.  ?Cardiovascular:  ?   Rate and Rhythm: Normal rate and regular rhythm.  ?Pulmonary:  ?   Effort: No respiratory distress.  ?   Breath sounds: No stridor.  ?Abdominal:  ?   General: Abdomen is flat. There is no distension.  ?Musculoskeletal:     ?   General: No swelling or tenderness. Normal range of motion.  ?   Cervical back: Normal range of motion.  ?Skin: ?   General: Skin is warm and dry.  ?Neurological:  ?   Mental Status: She is alert.  ?   Comments: RUE weakness  ? ? ?ED Results / Procedures / Treatments   ?Labs ?(all labs ordered are listed, but only abnormal results are displayed) ?Labs Reviewed  ?LEVETIRACETAM LEVEL  ?CBC WITH DIFFERENTIAL/PLATELET  ?COMPREHENSIVE METABOLIC PANEL  ?LACTIC ACID, PLASMA  ?LACTIC ACID, PLASMA  ?RAPID URINE DRUG SCREEN, HOSP PERFORMED  ?URINALYSIS, ROUTINE W REFLEX MICROSCOPIC  ?PROTIME-INR  ?ETHANOL  ?CBG MONITORING, ED  ? ? ?EKG ?None ? ?Radiology ?No results found. ? ?Procedures ?Procedures  ? ? ?Medications Ordered in ED ?Medications  ?lactated ringers bolus 1,000 mL (has no administration in time range)  ? ? ?ED Course/ Medical Decision Making/ A&P ?  ?                        ?Medical Decision Making ?Amount and/or Complexity of Data Reviewed ?Labs: ordered. ?Radiology: ordered. ? ?Risk ?Prescription drug management. ? ? ?46 year old female here with altered mental status of unclear etiology.  Possibly postictal.  Initial blood pressures in oxygenation look very low however after  switching to the other arm is more appropriate.  Patient is awake and mental status is improving.  CT scan shows new encephalomalacia questant MRI for further evaluation of possible new infarct.  Unknown last normal.  Patient would not likely be a candidate for intervention at this time so will discuss with hospitalist here for admission. ? ?Final Clinical Impression(s) / ED Diagnoses ?Final diagnoses:  ?None  ? ? ?Rx / DC  Orders ?ED Discharge Orders   ? ? None  ? ?  ? ? ?  ?Tillie Viverette, Corene Cornea, MD ?07/21/21 0222 ? ?

## 2021-07-21 ENCOUNTER — Observation Stay (HOSPITAL_COMMUNITY): Payer: Medicaid Other

## 2021-07-21 ENCOUNTER — Observation Stay (HOSPITAL_BASED_OUTPATIENT_CLINIC_OR_DEPARTMENT_OTHER): Payer: Medicaid Other

## 2021-07-21 ENCOUNTER — Other Ambulatory Visit (HOSPITAL_COMMUNITY): Payer: Medicaid Other

## 2021-07-21 ENCOUNTER — Other Ambulatory Visit (HOSPITAL_COMMUNITY): Payer: Self-pay | Admitting: *Deleted

## 2021-07-21 DIAGNOSIS — F319 Bipolar disorder, unspecified: Secondary | ICD-10-CM

## 2021-07-21 DIAGNOSIS — H0520 Unspecified exophthalmos: Secondary | ICD-10-CM | POA: Diagnosis not present

## 2021-07-21 DIAGNOSIS — I6389 Other cerebral infarction: Secondary | ICD-10-CM

## 2021-07-21 DIAGNOSIS — N179 Acute kidney failure, unspecified: Secondary | ICD-10-CM

## 2021-07-21 DIAGNOSIS — E876 Hypokalemia: Secondary | ICD-10-CM

## 2021-07-21 DIAGNOSIS — J9601 Acute respiratory failure with hypoxia: Secondary | ICD-10-CM

## 2021-07-21 DIAGNOSIS — R569 Unspecified convulsions: Secondary | ICD-10-CM

## 2021-07-21 DIAGNOSIS — G9341 Metabolic encephalopathy: Secondary | ICD-10-CM | POA: Diagnosis not present

## 2021-07-21 DIAGNOSIS — Z720 Tobacco use: Secondary | ICD-10-CM

## 2021-07-21 LAB — CBC WITH DIFFERENTIAL/PLATELET
Abs Immature Granulocytes: 0.08 10*3/uL — ABNORMAL HIGH (ref 0.00–0.07)
Basophils Absolute: 0.1 10*3/uL (ref 0.0–0.1)
Basophils Relative: 1 %
Eosinophils Absolute: 0.2 10*3/uL (ref 0.0–0.5)
Eosinophils Relative: 1 %
HCT: 34.9 % — ABNORMAL LOW (ref 36.0–46.0)
Hemoglobin: 11 g/dL — ABNORMAL LOW (ref 12.0–15.0)
Immature Granulocytes: 1 %
Lymphocytes Relative: 23 %
Lymphs Abs: 2.6 10*3/uL (ref 0.7–4.0)
MCH: 30.2 pg (ref 26.0–34.0)
MCHC: 31.5 g/dL (ref 30.0–36.0)
MCV: 95.9 fL (ref 80.0–100.0)
Monocytes Absolute: 0.5 10*3/uL (ref 0.1–1.0)
Monocytes Relative: 4 %
Neutro Abs: 8.1 10*3/uL — ABNORMAL HIGH (ref 1.7–7.7)
Neutrophils Relative %: 70 %
Platelets: 330 10*3/uL (ref 150–400)
RBC: 3.64 MIL/uL — ABNORMAL LOW (ref 3.87–5.11)
RDW: 15.6 % — ABNORMAL HIGH (ref 11.5–15.5)
WBC: 11.5 10*3/uL — ABNORMAL HIGH (ref 4.0–10.5)
nRBC: 1.2 % — ABNORMAL HIGH (ref 0.0–0.2)

## 2021-07-21 LAB — URINALYSIS, ROUTINE W REFLEX MICROSCOPIC
Bilirubin Urine: NEGATIVE
Glucose, UA: NEGATIVE mg/dL
Ketones, ur: NEGATIVE mg/dL
Nitrite: NEGATIVE
Protein, ur: 100 mg/dL — AB
Specific Gravity, Urine: 1.011 (ref 1.005–1.030)
Squamous Epithelial / HPF: >50 — ABNORMAL HIGH (ref 0–5)
pH: 6 (ref 5.0–8.0)

## 2021-07-21 LAB — LACTIC ACID, PLASMA
Lactic Acid, Venous: 0.7 mmol/L (ref 0.5–1.9)
Lactic Acid, Venous: 0.9 mmol/L (ref 0.5–1.9)
Lactic Acid, Venous: 1.1 mmol/L (ref 0.5–1.9)

## 2021-07-21 LAB — CBC
HCT: 33.9 % — ABNORMAL LOW (ref 36.0–46.0)
Hemoglobin: 10.5 g/dL — ABNORMAL LOW (ref 12.0–15.0)
MCH: 30.1 pg (ref 26.0–34.0)
MCHC: 31 g/dL (ref 30.0–36.0)
MCV: 97.1 fL (ref 80.0–100.0)
Platelets: 319 10*3/uL (ref 150–400)
RBC: 3.49 MIL/uL — ABNORMAL LOW (ref 3.87–5.11)
RDW: 15.7 % — ABNORMAL HIGH (ref 11.5–15.5)
WBC: 10.4 10*3/uL (ref 4.0–10.5)
nRBC: 1 % — ABNORMAL HIGH (ref 0.0–0.2)

## 2021-07-21 LAB — COMPREHENSIVE METABOLIC PANEL
ALT: 35 U/L (ref 0–44)
ALT: 37 U/L (ref 0–44)
AST: 70 U/L — ABNORMAL HIGH (ref 15–41)
AST: 85 U/L — ABNORMAL HIGH (ref 15–41)
Albumin: 3.4 g/dL — ABNORMAL LOW (ref 3.5–5.0)
Albumin: 3.7 g/dL (ref 3.5–5.0)
Alkaline Phosphatase: 65 U/L (ref 38–126)
Alkaline Phosphatase: 73 U/L (ref 38–126)
Anion gap: 10 (ref 5–15)
Anion gap: 8 (ref 5–15)
BUN: 21 mg/dL — ABNORMAL HIGH (ref 6–20)
BUN: 23 mg/dL — ABNORMAL HIGH (ref 6–20)
CO2: 29 mmol/L (ref 22–32)
CO2: 29 mmol/L (ref 22–32)
Calcium: 8.4 mg/dL — ABNORMAL LOW (ref 8.9–10.3)
Calcium: 8.8 mg/dL — ABNORMAL LOW (ref 8.9–10.3)
Chloride: 101 mmol/L (ref 98–111)
Chloride: 103 mmol/L (ref 98–111)
Creatinine, Ser: 1.05 mg/dL — ABNORMAL HIGH (ref 0.44–1.00)
Creatinine, Ser: 1.13 mg/dL — ABNORMAL HIGH (ref 0.44–1.00)
GFR, Estimated: >60 mL/min (ref >60)
GFR, Estimated: >60 mL/min (ref >60)
Glucose, Bld: 86 mg/dL (ref 70–99)
Glucose, Bld: 88 mg/dL (ref 70–99)
Potassium: 3 mmol/L — ABNORMAL LOW (ref 3.5–5.1)
Potassium: 3 mmol/L — ABNORMAL LOW (ref 3.5–5.1)
Sodium: 140 mmol/L (ref 135–145)
Sodium: 140 mmol/L (ref 135–145)
Total Bilirubin: 0.6 mg/dL (ref 0.3–1.2)
Total Bilirubin: 0.7 mg/dL (ref 0.3–1.2)
Total Protein: 6.3 g/dL — ABNORMAL LOW (ref 6.5–8.1)
Total Protein: 7 g/dL (ref 6.5–8.1)

## 2021-07-21 LAB — BASIC METABOLIC PANEL
Anion gap: 7 (ref 5–15)
BUN: 18 mg/dL (ref 6–20)
CO2: 29 mmol/L (ref 22–32)
Calcium: 8.3 mg/dL — ABNORMAL LOW (ref 8.9–10.3)
Chloride: 105 mmol/L (ref 98–111)
Creatinine, Ser: 0.89 mg/dL (ref 0.44–1.00)
GFR, Estimated: >60 mL/min (ref >60)
Glucose, Bld: 82 mg/dL (ref 70–99)
Potassium: 3.3 mmol/L — ABNORMAL LOW (ref 3.5–5.1)
Sodium: 141 mmol/L (ref 135–145)

## 2021-07-21 LAB — LIPID PANEL
Cholesterol: 187 mg/dL (ref 0–200)
HDL: 20 mg/dL — ABNORMAL LOW (ref >40)
LDL Cholesterol: UNDETERMINED mg/dL (ref 0–99)
Total CHOL/HDL Ratio: 9.4 RATIO
Triglycerides: 556 mg/dL — ABNORMAL HIGH (ref <150)
VLDL: UNDETERMINED mg/dL (ref 0–40)

## 2021-07-21 LAB — ECHOCARDIOGRAM COMPLETE
AR max vel: 2.57 cm2
AV Area VTI: 3.02 cm2
AV Area mean vel: 2.64 cm2
AV Mean grad: 3 mmHg
AV Peak grad: 6.7 mmHg
Ao pk vel: 1.29 m/s
Area-P 1/2: 6.71 cm2
Calc EF: 70.6 %
Height: 65 in
MV VTI: 2.47 cm2
S' Lateral: 2.7 cm
Single Plane A2C EF: 70.5 %
Single Plane A4C EF: 70.5 %
Weight: 2239.87 oz

## 2021-07-21 LAB — RAPID URINE DRUG SCREEN, HOSP PERFORMED
Amphetamines: NOT DETECTED
Barbiturates: NOT DETECTED
Benzodiazepines: POSITIVE — AB
Cocaine: NOT DETECTED
Opiates: NOT DETECTED
Tetrahydrocannabinol: POSITIVE — AB

## 2021-07-21 LAB — PROTIME-INR
INR: 1 (ref 0.8–1.2)
Prothrombin Time: 12.8 seconds (ref 11.4–15.2)

## 2021-07-21 LAB — MAGNESIUM: Magnesium: 2 mg/dL (ref 1.7–2.4)

## 2021-07-21 LAB — ETHANOL: Alcohol, Ethyl (B): <10 mg/dL (ref <10)

## 2021-07-21 LAB — TSH: TSH: 2.517 u[IU]/mL (ref 0.350–4.500)

## 2021-07-21 LAB — LDL CHOLESTEROL, DIRECT: Direct LDL: 78.5 mg/dL (ref 0–99)

## 2021-07-21 LAB — HIV ANTIBODY (ROUTINE TESTING W REFLEX): HIV Screen 4th Generation wRfx: NONREACTIVE

## 2021-07-21 LAB — GLUCOSE, CAPILLARY: Glucose-Capillary: 83 mg/dL (ref 70–99)

## 2021-07-21 MED ORDER — SODIUM CHLORIDE 0.9 % IV SOLN: INTRAVENOUS | Status: DC

## 2021-07-21 MED ORDER — PANTOPRAZOLE SODIUM 40 MG PO TBEC
40.0000 mg | DELAYED_RELEASE_TABLET | Freq: Two times a day (BID) | ORAL | Status: DC
  Administered 2021-07-21 – 2021-07-22 (×3): 40 mg via ORAL
  Filled 2021-07-21 (×4): qty 1

## 2021-07-21 MED ORDER — ALPRAZOLAM 1 MG PO TABS
1.0000 mg | ORAL_TABLET | Freq: Three times a day (TID) | ORAL | Status: DC | PRN
  Administered 2021-07-22 (×2): 1 mg via ORAL
  Filled 2021-07-21 (×2): qty 1

## 2021-07-21 MED ORDER — ASPIRIN EC 81 MG PO TBEC
81.0000 mg | DELAYED_RELEASE_TABLET | Freq: Every day | ORAL | Status: DC
  Administered 2021-07-21 – 2021-07-22 (×2): 81 mg via ORAL
  Filled 2021-07-21 (×2): qty 1

## 2021-07-21 MED ORDER — LACTATED RINGERS IV BOLUS
1000.0000 mL | Freq: Once | INTRAVENOUS | Status: AC
  Administered 2021-07-21: 1000 mL via INTRAVENOUS

## 2021-07-21 MED ORDER — HEPARIN SODIUM (PORCINE) 5000 UNIT/ML IJ SOLN
5000.0000 [IU] | Freq: Three times a day (TID) | INTRAMUSCULAR | Status: DC
  Administered 2021-07-21 – 2021-07-22 (×3): 5000 [IU] via SUBCUTANEOUS
  Filled 2021-07-21 (×3): qty 1

## 2021-07-21 MED ORDER — FENOFIBRATE 54 MG PO TABS
54.0000 mg | ORAL_TABLET | Freq: Every day | ORAL | Status: DC
  Administered 2021-07-21: 54 mg via ORAL
  Filled 2021-07-21 (×4): qty 1

## 2021-07-21 MED ORDER — GABAPENTIN 300 MG PO CAPS
600.0000 mg | ORAL_CAPSULE | Freq: Three times a day (TID) | ORAL | Status: DC
  Administered 2021-07-21 – 2021-07-22 (×4): 600 mg via ORAL
  Filled 2021-07-21 (×5): qty 2

## 2021-07-21 MED ORDER — POTASSIUM CHLORIDE 10 MEQ/100ML IV SOLN
10.0000 meq | INTRAVENOUS | Status: AC
  Administered 2021-07-21 (×3): 10 meq via INTRAVENOUS
  Filled 2021-07-21 (×3): qty 100

## 2021-07-21 MED ORDER — ALBUTEROL SULFATE HFA 108 (90 BASE) MCG/ACT IN AERS: 1.0000 | INHALATION_SPRAY | Freq: Four times a day (QID) | RESPIRATORY_TRACT | Status: DC | PRN

## 2021-07-21 MED ORDER — SUMATRIPTAN SUCCINATE 50 MG PO TABS
50.0000 mg | ORAL_TABLET | Freq: Every day | ORAL | Status: DC | PRN
  Filled 2021-07-21: qty 1

## 2021-07-21 MED ORDER — POTASSIUM CHLORIDE CRYS ER 20 MEQ PO TBCR
40.0000 meq | EXTENDED_RELEASE_TABLET | Freq: Once | ORAL | Status: AC
  Administered 2021-07-21: 40 meq via ORAL
  Filled 2021-07-21: qty 2

## 2021-07-21 MED ORDER — ACETAMINOPHEN 160 MG/5ML PO SOLN: 650.0000 mg | ORAL | Status: DC | PRN

## 2021-07-21 MED ORDER — LEVETIRACETAM 500 MG PO TABS
500.0000 mg | ORAL_TABLET | Freq: Two times a day (BID) | ORAL | Status: DC
  Administered 2021-07-21 – 2021-07-22 (×3): 500 mg via ORAL
  Filled 2021-07-21 (×3): qty 1

## 2021-07-21 MED ORDER — OXCARBAZEPINE 300 MG PO TABS
600.0000 mg | ORAL_TABLET | Freq: Two times a day (BID) | ORAL | Status: DC
  Administered 2021-07-21 – 2021-07-22 (×3): 600 mg via ORAL
  Filled 2021-07-21 (×4): qty 2

## 2021-07-21 MED ORDER — TRAZODONE HCL 50 MG PO TABS
50.0000 mg | ORAL_TABLET | Freq: Every evening | ORAL | Status: DC | PRN
  Administered 2021-07-21: 50 mg via ORAL
  Filled 2021-07-21: qty 1

## 2021-07-21 MED ORDER — POTASSIUM CHLORIDE 20 MEQ PO PACK
20.0000 meq | PACK | Freq: Once | ORAL | Status: AC
Start: 2021-07-21 — End: 2021-07-21
  Administered 2021-07-21: 20 meq via ORAL
  Filled 2021-07-21: qty 1

## 2021-07-21 MED ORDER — CLOPIDOGREL BISULFATE 75 MG PO TABS
75.0000 mg | ORAL_TABLET | Freq: Every day | ORAL | Status: DC
  Administered 2021-07-21 – 2021-07-22 (×2): 75 mg via ORAL
  Filled 2021-07-21 (×2): qty 1

## 2021-07-21 MED ORDER — DULOXETINE HCL 60 MG PO CPEP
60.0000 mg | ORAL_CAPSULE | Freq: Every day | ORAL | Status: DC
  Administered 2021-07-21 – 2021-07-22 (×2): 60 mg via ORAL
  Filled 2021-07-21: qty 2
  Filled 2021-07-21: qty 1

## 2021-07-21 MED ORDER — ACETAMINOPHEN 325 MG PO TABS
650.0000 mg | ORAL_TABLET | ORAL | Status: DC | PRN
  Administered 2021-07-22: 650 mg via ORAL
  Filled 2021-07-21: qty 2

## 2021-07-21 MED ORDER — ACETAMINOPHEN 650 MG RE SUPP: 650.0000 mg | RECTAL | Status: DC | PRN

## 2021-07-21 MED ORDER — SENNOSIDES-DOCUSATE SODIUM 8.6-50 MG PO TABS: 1.0000 | ORAL_TABLET | Freq: Every evening | ORAL | Status: DC | PRN

## 2021-07-21 MED ORDER — STROKE: EARLY STAGES OF RECOVERY BOOK
Freq: Once | Status: DC
  Filled 2021-07-21: qty 1

## 2021-07-21 MED ORDER — HYDROXYZINE HCL 25 MG PO TABS
25.0000 mg | ORAL_TABLET | Freq: Three times a day (TID) | ORAL | Status: DC | PRN
  Administered 2021-07-21: 25 mg via ORAL
  Filled 2021-07-21: qty 1

## 2021-07-21 MED ORDER — ALBUTEROL SULFATE (2.5 MG/3ML) 0.083% IN NEBU: 2.5000 mg | INHALATION_SOLUTION | Freq: Four times a day (QID) | RESPIRATORY_TRACT | Status: DC | PRN

## 2021-07-21 MED ORDER — FERROUS SULFATE 325 (65 FE) MG PO TABS
325.0000 mg | ORAL_TABLET | Freq: Two times a day (BID) | ORAL | Status: DC
  Administered 2021-07-21 – 2021-07-22 (×3): 325 mg via ORAL
  Filled 2021-07-21 (×3): qty 1

## 2021-07-21 NOTE — Assessment & Plan Note (Addendum)
-  Was likely secondary to postictal state; history of epilepsy ?-EEG in the a.m. ?-CT head does show some areas of new encephalomalacia recommending MRI ?-MRI in the a.m. ?-Check TSH ?-UA pending, UDS pending ?-Alcohol level undetectable ?-Continue to reorient ?-Since mental status is steadily improving, deferring work-up for nutritional deficiencies ?-Continue to monitor ?

## 2021-07-21 NOTE — Consult Note (Signed)
SLP Cancellation Note ? ?Patient Details ?Name: Lauren Cross ?MRN: [id number] ?DOB: [birth year] ? ? ?Cancelled treatment:       Reason Eval/Treat Not Completed: Patient at procedure or test/unavailable ? ? ?Elvina Sidle, M.S., CCC-SLP ?07/21/2021, 1:16 PM ?

## 2021-07-21 NOTE — Progress Notes (Signed)
*  PRELIMINARY RESULTS* ?Echocardiogram ?2D Echocardiogram has been performed. ? ?Elpidio Anis ?07/21/2021, 12:27 PM ?

## 2021-07-21 NOTE — ED Notes (Signed)
Patient ambulating around nurses station with PT at this time.  ?

## 2021-07-21 NOTE — Progress Notes (Signed)
OT Cancellation Note ? ?Patient Details ?Name: Lauren Cross ?MRN: [id number] ?DOB: 1975/10/27 ? ? ?Cancelled Treatment:    Reason Eval/Treat Not Completed: Medical issues which prohibited therapy. Pt not medically appropriate at this time due to BP issues. Discussed with nursing and was informed to wait for evaluation. Will attempt later when pt more medically appropriate.  ? ?Lexton Hidalgo OT, MOT ? ? ?Larey Seat ?07/21/2021, 8:46 AM ?

## 2021-07-21 NOTE — Procedures (Signed)
Patient Name: Lauren Cross  ?MRN: [id number]  ?Epilepsy Attending: Lora Havens  ?Referring Physician/Provider: Zierle-Ghosh, [place] B, DO ?Date: 07/21/2021  ?Duration: 22.30 mins ? ?Patient history: 46yo F with h/o seizures admitted with ams. EEG to evaluate for seizure ? ?Level of alertness: Awake ? ?AEDs during EEG study: LEV, OXC, GBP ? ?Technical aspects: This EEG study was done with scalp electrodes positioned according to the 10-20 International system of electrode placement. Electrical activity was acquired at a sampling rate of '500Hz'$  and reviewed with a high frequency filter of '70Hz'$  and a low frequency filter of '1Hz'$ . EEG data were recorded continuously and digitally stored.  ? ?Description: No clear posterior dominant rhythm was seen. EEG showed continuous generalized 5 to 6 Hz theta slowing. Hyperventilation and photic stimulation were not performed.    ? ?ABNORMALITY ?- Continuous slow, generalized ? ?IMPRESSION: ?This study is suggestive of moderate diffuse encephalopathy, nonspecific etiology. No seizures or epileptiform discharges were seen throughout the recording. ? ?Lora Havens  ? ?

## 2021-07-21 NOTE — ED Notes (Addendum)
Echo at bedside

## 2021-07-21 NOTE — Assessment & Plan Note (Signed)
Counseled on the importance of cessation ?Patient declines nicotine patch at this time ?Continue to monitor ?

## 2021-07-21 NOTE — Assessment & Plan Note (Signed)
Continue Keppra, Trileptal ?Seizure precautions ?Continue to monitor ?

## 2021-07-21 NOTE — ED Notes (Signed)
Patient changed out of regular clothes and placed in hospital gown. ?

## 2021-07-21 NOTE — Progress Notes (Signed)
PT Cancellation Note ? ?Patient Details ?Name: ZALEY TALLEY ?MRN: [id number] ?DOB: October 30, 1975 ? ? ?Cancelled Treatment:    Reason Eval/Treat Not Completed: Medical issues which prohibited therapy.  Patient BP low - RN aware.  Will check back when patient medically stable. ? ? ?12:33 PM, 07/21/21 ?Lonell Grandchild, MPT ?Physical Therapist with [facility] ?[facility] ?[phone] office ?[phone] mobile phone ? ?

## 2021-07-21 NOTE — ED Notes (Signed)
Patient out of bed without calling for assistance. Patient educated on use of call light to prevent falls. Patient verbalized understanding of using call light.  ?

## 2021-07-21 NOTE — Assessment & Plan Note (Signed)
-  With thyroid tenderness ?-check TSH ?

## 2021-07-21 NOTE — ED Notes (Signed)
Patient ambulated to restroom without assistance. Patient back in bed at this time.  ?

## 2021-07-21 NOTE — ED Notes (Signed)
Patient ambulated to side of bed to use female urinal. Patient required minimal assistance. Patient back in bed and call light within reach.  ?

## 2021-07-21 NOTE — Assessment & Plan Note (Signed)
Continue duloxetine, Xanax, hydroxyzine, trazodone ?Continue to monitor ?

## 2021-07-21 NOTE — Assessment & Plan Note (Signed)
Potassium 3.0 ?-30 mEq potassium given in the ED ?-Another 20 mEq of potassium at admission ?-Trend in the a.m. ?

## 2021-07-21 NOTE — Assessment & Plan Note (Signed)
-   Creatinine baseline 0.7 ?-Creatinine today 1.13 ?-Most likely due to dehydration given patient was hypotensive at arrival as well ?-2 L bolus given in the ED ?-Continue fluids 150 mL/h ?-Trend in the a.m. ?

## 2021-07-21 NOTE — ED Notes (Signed)
Hospitalist made aware of BP. Verbal order for lactic acid given.  ?

## 2021-07-21 NOTE — Progress Notes (Signed)
EEG completed, results pending. 

## 2021-07-21 NOTE — ED Notes (Signed)
Patient transported to MRI 

## 2021-07-21 NOTE — ED Notes (Signed)
Pt BP reading on monitor in the upper 60s, pt placed in trendelenburg and BP reassessed, BP 91 systolic, primary RN aware, pt requesting to ambulate to the BR, pt encouraged to not get out of bed and use purewick in place ?
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
## 2021-07-21 NOTE — Evaluation (Signed)
Physical Therapy Evaluation ?Patient Details ?Name: Lauren Cross ?MRN: [id number] ?DOB: 08-19-1975 ?Today's Date: 07/21/2021 ? ?History of Present Illness ? Lauren Cross is a 46 y.o. female with medical history significant of with history of anxiety, bipolar disorder, cervical cancer, GERD, fibromyalgia, myocardial infarction, prediabetes, seizures, stroke, vitamin D deficiency, and more presents ED with chief complaint of altered mental status.  Patient will answer questions, but the answers are not consistent at all with the story provided to the ED.  Is reported that patient had been complaining of arm weakness.  She is not complaining of that with me.  She has no demonstrable arm weakness.  She was then found on the floor of the bathroom by her mother.  She was very somnolent.  Throughout her time in the ER she been gradually waking up, mental status improving.  Unclear when her last known normal was, possibly 3 days ago.  She had a fall at that time to, and apparently mother had reported she has not been her normal self since then.  When asked patient says she is here for sore throat.  When reminded that she fell she said she tripped over her dog and landed on her size and then proceeds to point at a " huge bruise" on her right flank, but there is no discoloration there.  No palpable tenderness.  Patient is still quite altered, and history is not reliable at this time. ?  ?Clinical Impression ? Patient functioning near baseline for functional mobility and gait demonstrating good return for ambulation in room and hallways without loss of balance.  Plan:  Patient discharged from physical therapy to care of nursing for ambulation daily as tolerated for length of stay.  ?   ?   ? ?Recommendations for follow up therapy are one component of a multi-disciplinary discharge planning process, led by the attending physician.  Recommendations may be updated based on patient status, additional functional criteria and insurance  authorization. ? ?Follow Up Recommendations No PT follow up ? ?  ?Assistance Recommended at Discharge PRN  ?Patient can return home with the following ? Help with stairs or ramp for entrance ? ?  ?Equipment Recommendations None recommended by PT  ?Recommendations for Other Services ?    ?  ?Functional Status Assessment Patient has not had a recent decline in their functional status  ? ?  ?Precautions / Restrictions Precautions ?Precautions: None ?Restrictions ?Weight Bearing Restrictions: No  ? ?  ? ?Mobility ? Bed Mobility ?Overal bed mobility: Modified Independent ?  ?  ?  ?  ?  ?  ?  ?  ? ?Transfers ?Overall transfer level: Modified independent ?  ?  ?  ?  ?  ?  ?  ?  ?  ?  ? ?Ambulation/Gait ?Ambulation/Gait assistance: Modified independent (Device/Increase time) ?Gait Distance (Feet): 100 Feet ?Assistive device: None ?Gait Pattern/deviations: WFL(Within Functional Limits) ?Gait velocity: decreased ?  ?  ?General Gait Details: grossly WFL demonstrating good return for ambulation in room and hallways without loss of balance ? ?Stairs ?  ?  ?  ?  ?  ? ?Wheelchair Mobility ?  ? ?Modified Rankin (Stroke Patients Only) ?  ? ?  ? ?Balance Overall balance assessment: No apparent balance deficits (not formally assessed) ?  ?  ?  ?  ?  ?  ?  ?  ?  ?  ?  ?  ?  ?  ?  ?  ?  ?  ?   ? ? ? ?  Pertinent Vitals/Pain Pain Assessment ?Pain Assessment: No/denies pain  ? ? ?Home Living Family/patient expects to be discharged to:: Private residence ?Living Arrangements: Alone ?Available Help at Discharge: Family;Available PRN/intermittently ?Type of Home: House ?Home Access: Stairs to enter ?Entrance Stairs-Rails: Right;Left;Can reach both ?Entrance Stairs-Number of Steps: 2 ?  ?Home Layout: One level ?Home Equipment: Conservation officer, nature (2 wheels);Cane - single point;Grab bars - tub/shower ?   ?  ?Prior Function Prior Level of Function : Independent/Modified Independent ?  ?  ?  ?  ?  ?  ?Mobility Comments: Community ambulator without AD,  drives ?ADLs Comments: Independent ?  ? ? ?Hand Dominance  ? Dominant Hand: Right ? ?  ?Extremity/Trunk Assessment  ? Upper Extremity Assessment ?Upper Extremity Assessment: Defer to OT evaluation ?  ? ?Lower Extremity Assessment ?Lower Extremity Assessment: Overall WFL for tasks assessed ?  ? ?Cervical / Trunk Assessment ?Cervical / Trunk Assessment: Normal  ?Communication  ? Communication: No difficulties  ?Cognition Arousal/Alertness: Awake/alert ?Behavior During Therapy: [facility] for tasks assessed/performed ?Overall Cognitive Status: Within Functional Limits for tasks assessed ?  ?  ?  ?  ?  ?  ?  ?  ?  ?  ?  ?  ?  ?  ?  ?  ?  ?  ?  ? ?  ?General Comments   ? ?  ?Exercises    ? ?Assessment/Plan  ?  ?PT Assessment Patient does not need any further PT services  ?PT Problem List   ? ?   ?  ?PT Treatment Interventions     ? ?PT Goals (Current goals can be found in the Care Plan section)  ?Acute Rehab PT Goals ?Patient Stated Goal: return home ?PT Goal Formulation: With patient ?Time For Goal Achievement: 07/21/21 ? ?  ?Frequency   ?  ? ? ?Co-evaluation   ?  ?  ?  ?  ? ? ?  ?AM-PAC PT "6 Clicks" Mobility  ?Outcome Measure Help needed turning from your back to your side while in a flat bed without using bedrails?: None ?Help needed moving from lying on your back to sitting on the side of a flat bed without using bedrails?: None ?Help needed moving to and from a bed to a chair (including a wheelchair)?: None ?Help needed standing up from a chair using your arms (e.g., wheelchair or bedside chair)?: None ?Help needed to walk in hospital room?: None ?Help needed climbing 3-5 steps with a railing? : A Little ?6 Click Score: 23 ? ?  ?End of Session   ?Activity Tolerance: Patient tolerated treatment well ?Patient left: in bed;with call bell/phone within reach ?Nurse Communication: Mobility status ?PT Visit Diagnosis: Unsteadiness on feet (R26.81);Other abnormalities of gait and mobility (R26.89);Muscle weakness (generalized)  (M62.81) ?  ? ?Time: 3614-4315 ?PT Time Calculation (min) (ACUTE ONLY): 10 min ? ? ?Charges:   PT Evaluation ?$PT Eval Low Complexity: 1 Low ?PT Treatments ?$Therapeutic Activity: 8-22 mins ?  ?   ? ? ?3:14 PM, 07/21/21 ?Lonell Grandchild, MPT ?Physical Therapist with [place] ?[facility] ?[phone] office ?[phone] mobile phone ? ? ?

## 2021-07-21 NOTE — Assessment & Plan Note (Signed)
-   Oxygen sats reportedly 70s at arrival ?-Secondary to recent seizure/postictal state most likely ?-Check chest x-ray ?-Patient currently requiring 3 L nasal cannula, wean off as tolerated ?-Albuterol as needed for wheezing or shortness of breath ?-Continue to monitor ?

## 2021-07-21 NOTE — Progress Notes (Signed)
Lauren Cross is a 46 y.o. female with medical history significant of with history of anxiety, bipolar disorder, cervical cancer, GERD, fibromyalgia, myocardial infarction, prediabetes, seizures, stroke, vitamin D deficiency presented to ED with complaint of altered mental status.  Reportedly she was found on the floor of the bathroom by her mother.  She was very somnolent.  She continued to improve while in the ED.  Patient could not provide straightforward history to the admitting provider.  She has a history of seizures and it was presumed that she likely had seizure and was not postictal state.  Patient seen and examined in the ED.  She cannot recall anything about what happened.  She still appears to be partly altered.  She cannot focus on the questions.  Her answers are irrelevant.  She underwent CT head followed by MRI brain which shows multiple infarcts but all chronic.  On examination, she does not have any focal deficit.  EEG is pending.  UDS positive for benzodiazepine and THC.  UA has no nitrites but positive leukoesterase but patient does not complaining of any urinary symptoms and she is improving slowly without antibiotics so at this point in time I will hold any antibiotics.  She also came in with AKI which was likely secondary to dehydration.  She received IV fluids.  AKI has resolved.  She still has hypokalemia.  I will replace again today.  TSH within normal range.  No lactic acidosis.  Normal magnesium.  PT OT on board.  This morning patient also had low blood pressure but that improved with her current IV fluids.  Keppra level pending.  Patient claims that she takes her medications daily.  Per H&P, she had some hypoxia requiring 3 L oxygen yesterday however during my evaluation, she was saturating well over 90% on room air and was comfortable and lungs were clear to auscultation as well. ?

## 2021-07-21 NOTE — Progress Notes (Signed)
Patient impulsive with her behavior, redirectable. She told the ER nurse that "you made me bleed" when removing o2 sensor, nothing there. Patient assisted to restroom, stated "theres bugs in the floor crawling around in the dark". Light on, and no bugs. Patient confabulates. Bed alarm in use. Floor mat placed. Answers some questions appropriately, but makes comments that do not make sense or are incorrect. ?

## 2021-07-22 DIAGNOSIS — G9341 Metabolic encephalopathy: Secondary | ICD-10-CM | POA: Diagnosis not present

## 2021-07-22 LAB — BASIC METABOLIC PANEL
Anion gap: 6 (ref 5–15)
BUN: 12 mg/dL (ref 6–20)
CO2: 26 mmol/L (ref 22–32)
Calcium: 7.8 mg/dL — ABNORMAL LOW (ref 8.9–10.3)
Chloride: 108 mmol/L (ref 98–111)
Creatinine, Ser: 0.85 mg/dL (ref 0.44–1.00)
GFR, Estimated: >60 mL/min (ref >60)
Glucose, Bld: 87 mg/dL (ref 70–99)
Potassium: 3.3 mmol/L — ABNORMAL LOW (ref 3.5–5.1)
Sodium: 140 mmol/L (ref 135–145)

## 2021-07-22 LAB — HEMOGLOBIN A1C
Hgb A1c MFr Bld: 5.9 % — ABNORMAL HIGH (ref 4.8–5.6)
Mean Plasma Glucose: 123 mg/dL

## 2021-07-22 MED ORDER — LEVETIRACETAM 750 MG PO TABS: 750.0000 mg | ORAL_TABLET | Freq: Two times a day (BID) | ORAL | 1 refills | Status: DC

## 2021-07-22 NOTE — Discharge Summary (Signed)
Physician Discharge Summary  ?HASET OAXACA FXT:[id number] DOB: 04-Jan-1976 DOA: 07/20/2021 ? ?PCP: [name], MD ? ?Admit date: 07/20/2021 ?Discharge date: 07/22/2021 ? ?Admitted From: Home ?Disposition: Home ? ?Recommendations for Outpatient Follow-up:  ?Follow up with PCP in 1-2 weeks ?Please obtain BMP/CBC in one week ?Please follow up with neurology as scheduled none ? ?Home Health: None ?Equipment/Devices: None ? ?Discharge Condition: Stable ?CODE STATUS: Full ?Diet recommendation: As tolerated ? ?Brief/Interim Summary: ?  ?Lauren Cross is a 46 y.o. female with medical history significant of with history of anxiety, bipolar disorder, cervical cancer, GERD, fibromyalgia, myocardial infarction, prediabetes, seizures, stroke, vitamin D deficiency, and more presents ED with chief complaint of altered mental status. Admitted for acute breakthrough seizure and post-ictal state with confusion. ? ?Patient returned to baseline today, ANO x4, indicates her primary neurology team in the outpatient setting has been trying to wean her off for [place] given she is on multiple medications and to reduce her risk of polypharmacy.  Unfortunately it appears patient's acute metabolic encephalopathy secondary to breakthrough seizure, as such we will place patient back on 750 mg twice daily dosing she was previously stable on for close outpatient following.  Patient otherwise stable and agreeable for discharge home. ? ?Discharge Diagnoses:  ?Principal Problem: ?  Acute metabolic encephalopathy ?Active Problems: ?  Bipolar disorder ([place]) ?  Seizures ([place]) ?  Tobacco user ?  Exophthalmos ?  Acute respiratory failure with hypoxia ([place]) ?  Hypokalemia ?  AKI (acute kidney injury) ([place]) ? ? ? ?Discharge Instructions ? ?Discharge Instructions   ? ? Discharge patient   Complete by: As directed ?  ? Discharge disposition: 01-Home or Self Care  ? Discharge patient date: 07/22/2021  ? ?  ? ?Allergies as of 07/22/2021   ? ?   Reactions  ? Zetia  [ezetimibe] Anaphylaxis  ? Zofran [ondansetron Hcl]   ? States her throat "closed up"  ? Hydromorphone Other (See Comments)  ? aggitation  ? Moviprep [peg-kcl-nacl-nasulf-na Asc-c]   ? States caused her to "Choke"   ? Oxycodone Other (See Comments)  ? hallucinations  ? Tape Other (See Comments)  ? unknown  ? Codeine Nausea Only  ? aggitation  ? ?  ? ?  ?Medication List  ?  ? ?STOP taking these medications   ? ?cephALEXin 500 MG capsule ?Commonly known as: KEFLEX ?  ?hydrOXYzine 25 MG tablet ?Commonly known as: ATARAX ?  ? ?  ? ?TAKE these medications   ? ?albuterol 108 (90 Base) MCG/ACT inhaler ?Commonly known as: VENTOLIN HFA ?Inhale 1-2 puffs into the lungs every 6 (six) hours as needed for wheezing or shortness of breath. ?  ?ALPRAZolam 1 MG tablet ?Commonly known as: Duanne Moron ?Take 1 tablet (1 mg total) by mouth 3 (three) times daily as needed for anxiety. ?What changed: Another medication with the same name was removed. Continue taking this medication, and follow the directions you see here. ?  ?aspirin EC 81 MG tablet ?Take 81 mg by mouth daily. ?  ?atorvastatin 80 MG tablet ?Commonly known as: LIPITOR ?Take 80 mg by mouth daily. ?  ?azelastine 0.1 % nasal spray ?Commonly known as: ASTELIN ?Place 2 sprays into both nostrils 2 (two) times daily. ?  ?clopidogrel 75 MG tablet ?Commonly known as: PLAVIX ?Take 75 mg by mouth daily. ?  ?Dextran 70-Hypromellose 0.1-0.3 % Soln ?Apply 1 drop to eye every 4 (four) hours as needed (dry eyes). ?  ?DULoxetine 60 MG capsule ?Commonly known as:  CYMBALTA ?Take 60 mg by mouth daily. ?What changed: Another medication with the same name was removed. Continue taking this medication, and follow the directions you see here. ?  ?FeroSul 325 (65 FE) MG tablet ?Generic drug: ferrous sulfate ?Take 325 mg by mouth 2 (two) times daily. ?  ?gabapentin 300 MG capsule ?Commonly known as: NEURONTIN ?Take 2 capsules (600 mg total) by mouth 3 (three) times daily. For agitation/pain ?  ?ibuprofen  800 MG tablet ?Commonly known as: ADVIL ?Take 800 mg by mouth 3 (three) times daily. ?  ?levETIRAcetam 750 MG tablet ?Commonly known as: KEPPRA ?Take 1 tablet (750 mg total) by mouth 2 (two) times daily. ?What changed: medication strength ?  ?metFORMIN 500 MG 24 hr tablet ?Commonly known as: GLUCOPHAGE-XR ?Take 1,000 mg by mouth every evening. ?  ?methocarbamol 750 MG tablet ?Commonly known as: ROBAXIN ?Take 750 mg by mouth 2 (two) times daily. ?  ?nicotine 21 mg/24hr patch ?Commonly known as: NICODERM CQ - dosed in mg/24 hours ?Place 1 patch (21 mg total) onto the skin daily. (May buy from over the counter: For smoking cessation ?  ?nitroGLYCERIN 0.4 MG SL tablet ?Commonly known as: NITROSTAT ?Place 0.4 mg under the tongue every 5 (five) minutes as needed for chest pain. ?  ?ondansetron 4 MG disintegrating tablet ?Commonly known as: ZOFRAN-ODT ?Take 4 mg by mouth every 8 (eight) hours as needed for nausea or vomiting. ?  ?oxcarbazepine 600 MG tablet ?Commonly known as: TRILEPTAL ?Take 1 tablet (600 mg total) by mouth 2 (two) times daily. For mood stabilization ?  ?pantoprazole 40 MG tablet ?Commonly known as: PROTONIX ?TAKE (1) TABLET BY MOUTH TWICE A DAY BEFORE MEALS. (BREAKFAST AND SUPPER) ?What changed: See the new instructions. ?  ?SUMAtriptan 50 MG tablet ?Commonly known as: IMITREX ?Take 1 tablet (50 mg total) by mouth daily as needed for migraine or headache (first dose now). May repeat in 2 hours if headache persists or recurs. ?  ?traZODone 100 MG tablet ?Commonly known as: DESYREL ?Take 200 mg by mouth at bedtime as needed. ?  ?Vitamin D 125 MCG (5000 UT) Caps ?Take 1 capsule by mouth daily. ?  ?Vraylar 3 MG capsule ?Generic drug: cariprazine ?Take 3 mg by mouth daily. ?  ? ?  ? ? ?Allergies  ?Allergen Reactions  ? Zetia [Ezetimibe] Anaphylaxis  ? Zofran [Ondansetron Hcl]   ?  States her throat "closed up"  ? Hydromorphone Other (See Comments)  ?  aggitation  ? Moviprep [Peg-Kcl-Nacl-Nasulf-Na Asc-C]   ?   States caused her to "Choke"   ? Oxycodone Other (See Comments)  ?  hallucinations  ? Tape Other (See Comments)  ?  unknown  ? Codeine Nausea Only  ?  aggitation  ? ? ?Consultations: ?Neurology ? ? ?Procedures/Studies: ?CT Head Wo Contrast ? ?Result Date: 07/21/2021 ?CLINICAL DATA:  Head trauma, moderate to severe. Patient has been altered since a fall 3 days ago. Possible seizure. EXAM: CT HEAD WITHOUT CONTRAST TECHNIQUE: Contiguous axial images were obtained from the base of the skull through the vertex without intravenous contrast. RADIATION DOSE REDUCTION: This exam was performed according to the departmental dose-optimization program which includes automated exposure control, adjustment of the mA and/or kV according to patient size and/or use of iterative reconstruction technique. COMPARISON:  CT head 08/05/2012.  MRI brain 09/26/2015 FINDINGS: Brain: focal area of encephalomalacia in the left medial for parietal lobe adjacent to the posterior falx. This is new since previous studies. MRI is suggested for  further evaluation. Low-attenuation changes in the cerebellum suggesting cerebellar infarcts. No significant mass effect or midline shift. Gray-white matter junctions are otherwise normal. No acute intracranial hemorrhage. Vascular: Mild intracranial arterial vascular calcifications. Skull: Normal. Negative for fracture or focal lesion. Sinuses/Orbits: No acute finding. Other: None. IMPRESSION: New area of encephalomalacia in the left posterior medial occipital lobe. Low-attenuation lesions in the cerebellum. Changes are new since previous study. MRI suggested for further evaluation. No acute intracranial hemorrhage or mass effect. Electronically Signed   By: Lucienne Capers M.D.   On: 07/21/2021 00:30  ? ?MR BRAIN WO CONTRAST ? ?Result Date: 07/21/2021 ?CLINICAL DATA:  Stroke follow-up. Altered mental status. Recent fall. EXAM: MRI HEAD WITHOUT CONTRAST TECHNIQUE: Multiplanar, multiecho pulse sequences of the  brain and surrounding structures were obtained without intravenous contrast. COMPARISON:  Head CT 07/21/2021 and MRI 09/26/2015 FINDINGS: Brain: There is no evidence of an acute infarct, intracranial hemorrha

## 2021-07-22 NOTE — Progress Notes (Signed)
OT Cancellation Note ? ?Patient Details ?Name: Lauren Cross ?MRN: [id number] ?DOB: July 11, 1975 ? ? ?Cancelled Treatment:    Reason Eval/Treat Not Completed: OT screened, no needs identified, will sign off. Discussed pt with physical therapist. Pt is ambulating and near baseline.  ? ?Makenize Messman OT, MOT ? ? ?Larey Seat ?07/22/2021, 8:01 AM ?

## 2021-07-22 NOTE — Progress Notes (Signed)
SLP Cancellation Note ? ?Patient Details ?Name: Lauren Cross ?MRN: [id number] ?DOB: [birth year] ? ? ?Cancelled treatment:       Reason Eval/Treat Not Completed: SLP screened, no needs identified, will sign off; SLP screened Pt in room. Pt denies any changes in swallowing, speech, language, or cognition. MRI negative for acute changes. SLE will be deferred at this time. Reconsult if indicated. SLP will sign off. ? ? ?Thank you, ? ?Genene Churn, [place] ?[phone] ? ? ? ?[place] ?07/22/2021, 8:36 AM ?

## 2021-07-22 NOTE — Progress Notes (Signed)
Discharge instructions provided to patient. IV and heart monitor removed. Called patient's mother, Opal Sidles to inform of discharge and update on patient's appointment tomorrow morning at 10 am with PCP. Mother states she would be here in about 30 minutes to pick up patient.  ?

## 2021-07-23 LAB — LEVETIRACETAM LEVEL: Levetiracetam Lvl: 28 ug/mL (ref 10.0–40.0)

## 2021-09-04 ENCOUNTER — Encounter (HOSPITAL_COMMUNITY): Payer: Medicaid Other | Admitting: Hematology

## 2021-09-11 ENCOUNTER — Inpatient Hospital Stay (HOSPITAL_COMMUNITY): Payer: Medicaid Other | Attending: Hematology | Admitting: Hematology

## 2021-09-11 ENCOUNTER — Other Ambulatory Visit (HOSPITAL_COMMUNITY)
Admission: RE | Admit: 2021-09-11 | Discharge: 2021-09-11 | Disposition: A | Discharge status: Home / Self Care | Payer: Medicaid Other | Source: Ambulatory Visit | Attending: Hematology | Admitting: Hematology

## 2021-09-11 ENCOUNTER — Inpatient Hospital Stay (HOSPITAL_COMMUNITY): Payer: Medicaid Other

## 2021-09-11 VITALS — BP 88/67 | HR 97 | Temp 97.8°F | Resp 18 | Ht 65.0 in | Wt 132.5 lb

## 2021-09-11 DIAGNOSIS — D649 Anemia, unspecified: Secondary | ICD-10-CM

## 2021-09-11 DIAGNOSIS — Z9481 Bone marrow transplant status: Secondary | ICD-10-CM | POA: Insufficient documentation

## 2021-09-11 DIAGNOSIS — Z803 Family history of malignant neoplasm of breast: Secondary | ICD-10-CM | POA: Diagnosis not present

## 2021-09-11 DIAGNOSIS — F17210 Nicotine dependence, cigarettes, uncomplicated: Secondary | ICD-10-CM | POA: Insufficient documentation

## 2021-09-11 DIAGNOSIS — K649 Unspecified hemorrhoids: Secondary | ICD-10-CM | POA: Diagnosis not present

## 2021-09-11 DIAGNOSIS — Z808 Family history of malignant neoplasm of other organs or systems: Secondary | ICD-10-CM | POA: Insufficient documentation

## 2021-09-11 LAB — CBC WITH DIFFERENTIAL/PLATELET
Abs Immature Granulocytes: 0.05 10*3/uL (ref 0.00–0.07)
Basophils Absolute: 0.1 10*3/uL (ref 0.0–0.1)
Basophils Relative: 1 %
Eosinophils Absolute: 0.3 10*3/uL (ref 0.0–0.5)
Eosinophils Relative: 2 %
HCT: 46.3 % — ABNORMAL HIGH (ref 36.0–46.0)
Hemoglobin: 13.9 g/dL (ref 12.0–15.0)
Immature Granulocytes: 0 %
Lymphocytes Relative: 20 %
Lymphs Abs: 2.9 10*3/uL (ref 0.7–4.0)
MCH: 28.5 pg (ref 26.0–34.0)
MCHC: 30 g/dL (ref 30.0–36.0)
MCV: 95.1 fL (ref 80.0–100.0)
Monocytes Absolute: 0.8 10*3/uL (ref 0.1–1.0)
Monocytes Relative: 5 %
Neutro Abs: 10.7 10*3/uL — ABNORMAL HIGH (ref 1.7–7.7)
Neutrophils Relative %: 72 %
Platelets: 435 10*3/uL — ABNORMAL HIGH (ref 150–400)
RBC: 4.87 MIL/uL (ref 3.87–5.11)
RDW: 14.9 % (ref 11.5–15.5)
WBC: 14.9 10*3/uL — ABNORMAL HIGH (ref 4.0–10.5)
nRBC: 0 % (ref 0.0–0.2)

## 2021-09-11 LAB — COMPREHENSIVE METABOLIC PANEL
ALT: 16 U/L (ref 0–44)
AST: 27 U/L (ref 15–41)
Albumin: 3.8 g/dL (ref 3.5–5.0)
Alkaline Phosphatase: 101 U/L (ref 38–126)
Anion gap: 4 — ABNORMAL LOW (ref 5–15)
BUN: 11 mg/dL (ref 6–20)
CO2: 25 mmol/L (ref 22–32)
Calcium: 9 mg/dL (ref 8.9–10.3)
Chloride: 109 mmol/L (ref 98–111)
Creatinine, Ser: 1.09 mg/dL — ABNORMAL HIGH (ref 0.44–1.00)
GFR, Estimated: >60 mL/min (ref >60)
Glucose, Bld: 84 mg/dL (ref 70–99)
Potassium: 3.2 mmol/L — ABNORMAL LOW (ref 3.5–5.1)
Sodium: 138 mmol/L (ref 135–145)
Total Bilirubin: 0.8 mg/dL (ref 0.3–1.2)
Total Protein: 7.4 g/dL (ref 6.5–8.1)

## 2021-09-11 LAB — LACTATE DEHYDROGENASE: LDH: 114 U/L (ref 98–192)

## 2021-09-11 LAB — RETICULOCYTES
Immature Retic Fract: 12.8 % (ref 2.3–15.9)
RBC.: 4.94 MIL/uL (ref 3.87–5.11)
Retic Count, Absolute: 66.2 10*3/uL (ref 19.0–186.0)
Retic Ct Pct: 1.4 % (ref 0.4–3.1)

## 2021-09-11 LAB — VITAMIN B12: Vitamin B-12: 344 pg/mL (ref 180–914)

## 2021-09-11 LAB — FERRITIN: Ferritin: 13 ng/mL (ref 11–307)

## 2021-09-11 LAB — IRON AND TIBC
Iron: 25 ug/dL — ABNORMAL LOW (ref 28–170)
Saturation Ratios: 6 % — ABNORMAL LOW (ref 10.4–31.8)
TIBC: 445 ug/dL (ref 250–450)
UIBC: 420 ug/dL

## 2021-09-11 LAB — FOLATE: Folate: 8.3 ng/mL (ref >5.9)

## 2021-09-11 NOTE — Patient Instructions (Addendum)
[place] at [facility] Discharge Instructions  You were seen and examined today by Dr. Delton Coombes. Dr. Delton Coombes is a hematologist, meaning that he specializes in blood abnormalities. Dr. Delton Coombes discussed your past medical history, family history of cancers/blood conditions and the events that led to you being here today.  You were referred to Dr. Delton Coombes due to anemia. Dr. Delton Coombes has recommended additional lab work today in an attempt to identify the cause of your anemia.   Your anemia could be related to kidney dysfunction. This will be checked also.  Please follow-up as scheduled.   Thank you for choosing [place] at [facility] to provide your oncology and hematology care.  To afford each patient quality time with our provider, please arrive at least 15 minutes before your scheduled appointment time.   If you have a lab appointment with the [facility] please come in thru the Main Entrance and check in at the main information desk.  You need to re-schedule your appointment should you arrive 10 or more minutes late.  We strive to give you quality time with our providers, and arriving late affects you and other patients whose appointments are after yours.  Also, if you no show three or more times for appointments you may be dismissed from the clinic at the providers discretion.     Again, thank you for choosing [facility].  Our hope is that these requests will decrease the amount of time that you wait before being seen by our physicians.       _____________________________________________________________  Should you have questions after your visit to [facility], please contact our office at [phone] and follow the prompts.  Our office hours are 8:00 a.m. and 4:30 p.m. Monday - Friday.  Please note that voicemails left after 4:00 p.m. may not be returned until the following business day.  We are  closed weekends and major holidays.  You do have access to a nurse 24-7, just call the main number to the clinic [phone] and do not press any options, hold on the line and a nurse will answer the phone.    For prescription refill requests, have your pharmacy contact our office and allow 72 hours.    Due to Covid, you will need to wear a mask upon entering the hospital. If you do not have a mask, a mask will be given to you at the Main Entrance upon arrival. For doctor visits, patients may have 1 support person age 13 or older with them. For treatment visits, patients can not have anyone with them due to social distancing guidelines and our immunocompromised population.

## 2021-09-11 NOTE — Progress Notes (Signed)
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 fasting for at least 8 hours.  Basic metabolic panel     Status: Abnormal   Collection Time: 07/22/21  5:40 AM  Result Value Ref Range   Sodium 140 135 - 145 mmol/L   Potassium 3.3 (L) 3.5 - 5.1 mmol/L   Chloride 108 98 - 111 mmol/L   CO2 26 22 - 32 mmol/L   Glucose, Bld 87 70 - 99 mg/dL    Comment: Glucose reference range applies only to samples taken after fasting for at least 8 hours.   BUN 12 6 - 20 mg/dL   Creatinine, Ser 0.85 0.44 - 1.00 mg/dL   Calcium 7.8 (L) 8.9 - 10.3 mg/dL    GFR, Estimated >60 >  60 mL/min    Comment: (NOTE) Calculated using the CKD-EPI Creatinine Equation (2021)    Anion gap 6 5 - 15    Comment: Performed at [facility], [address], [place], [place] [address]    RADIOGRAPHIC STUDIES: I have personally reviewed the radiological images as listed and agreed with the findings in the report. No results found.  ASSESSMENT:  Normocytic anemia: - Patient seen at the request of Dr. Bartolo Darter for normocytic anemia.  Labs around 08/05/2021: Hemoglobin-10.6, MCV 90, creatinine range 1.03-1.15 (GFR 59-67) - She had normal hemoglobin 14.7, MCV 89 on 11/29/2020. - She reports that she had been anemic since left ankle surgery in July 2022. - She has occasional hemorrhoidal bleeding, once a month.  She reportedly had colonoscopy 2 years ago.  LMP was 4 years ago when she had early menopause. - Denies any ice pica. - Reports 20 pound weight loss in the last 6 months due to decreased appetite.  She has hot flashes last 1 year, more intense at nighttime. 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  I could not verify it on Care Everywhere.   Social/family history: - She lives with her mother.  She is on disability.  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  She is current active smoker, 1.5 PPD for 34 years. - Father had lung cancer.   PLAN:  Mild normocytic anemia: - Differential diagnosis includes anemia from CKD.  Creatinine clearance is mildly decreased on labs from Dr. Bartolo Darter. - We will repeat creatinine today.  We will also check CBC and nutritional deficiencies, hemolysis and bone marrow infiltrative process. - RTC 2 weeks for follow-up to discuss results. - If there is continued weight loss, will consider imaging.   All questions were answered. The patient knows to call the clinic with any  problems, questions or concerns.  Derek Jack, MD 09/11/21 11:40 AM  [place] [phone]   I, Thana Ates, am acting as a scribe for Dr. Derek Jack.  I, Derek Jack MD, have reviewed the above documentation for accuracy and completeness, and I agree with the above.

## 2021-09-14 LAB — COPPER, SERUM: Copper: 150 ug/dL (ref 80–158)

## 2021-09-14 LAB — HAPTOGLOBIN: Haptoglobin: 253 mg/dL (ref 42–296)

## 2021-09-15 LAB — PROTEIN ELECTROPHORESIS, SERUM
A/G Ratio: 1 (ref 0.7–1.7)
Albumin ELP: 3.4 g/dL (ref 2.9–4.4)
Alpha-1-Globulin: 0.3 g/dL (ref 0.0–0.4)
Alpha-2-Globulin: 1.1 g/dL — ABNORMAL HIGH (ref 0.4–1.0)
Beta Globulin: 1.3 g/dL (ref 0.7–1.3)
Gamma Globulin: 0.9 g/dL (ref 0.4–1.8)
Globulin, Total: 3.5 g/dL (ref 2.2–3.9)
Total Protein ELP: 6.9 g/dL (ref 6.0–8.5)

## 2021-09-16 LAB — METHYLMALONIC ACID, SERUM: Methylmalonic Acid, Quantitative: 286 nmol/L (ref 0–378)
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
## 2021-09-28 ENCOUNTER — Inpatient Hospital Stay (HOSPITAL_COMMUNITY): Payer: Medicaid Other | Admitting: Physician Assistant
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
## 2021-10-05 ENCOUNTER — Emergency Department (HOSPITAL_COMMUNITY)
Admission: EM | Admit: 2021-10-05 | Discharge: 2021-10-05 | Disposition: A | Discharge status: Home / Self Care | Payer: Medicaid Other | Attending: Emergency Medicine | Admitting: Emergency Medicine

## 2021-10-05 ENCOUNTER — Emergency Department (HOSPITAL_COMMUNITY): Payer: Medicaid Other

## 2021-10-05 ENCOUNTER — Encounter (HOSPITAL_COMMUNITY): Payer: Self-pay | Admitting: *Deleted

## 2021-10-05 ENCOUNTER — Inpatient Hospital Stay (HOSPITAL_COMMUNITY): Payer: Medicaid Other | Attending: Hematology | Admitting: Physician Assistant

## 2021-10-05 ENCOUNTER — Other Ambulatory Visit: Payer: Self-pay

## 2021-10-05 VITALS — BP 81/68 | HR 124 | Temp 97.9°F | Resp 19 | Ht 65.0 in | Wt 135.4 lb

## 2021-10-05 DIAGNOSIS — R634 Abnormal weight loss: Secondary | ICD-10-CM | POA: Insufficient documentation

## 2021-10-05 DIAGNOSIS — R000 Tachycardia, unspecified: Secondary | ICD-10-CM | POA: Diagnosis not present

## 2021-10-05 DIAGNOSIS — F17210 Nicotine dependence, cigarettes, uncomplicated: Secondary | ICD-10-CM | POA: Diagnosis not present

## 2021-10-05 DIAGNOSIS — K219 Gastro-esophageal reflux disease without esophagitis: Secondary | ICD-10-CM | POA: Insufficient documentation

## 2021-10-05 DIAGNOSIS — Z8673 Personal history of transient ischemic attack (TIA), and cerebral infarction without residual deficits: Secondary | ICD-10-CM | POA: Insufficient documentation

## 2021-10-05 DIAGNOSIS — R5383 Other fatigue: Secondary | ICD-10-CM | POA: Diagnosis not present

## 2021-10-05 DIAGNOSIS — Z7902 Long term (current) use of antithrombotics/antiplatelets: Secondary | ICD-10-CM | POA: Diagnosis not present

## 2021-10-05 DIAGNOSIS — I10 Essential (primary) hypertension: Secondary | ICD-10-CM | POA: Insufficient documentation

## 2021-10-05 DIAGNOSIS — D75839 Thrombocytosis, unspecified: Secondary | ICD-10-CM | POA: Insufficient documentation

## 2021-10-05 DIAGNOSIS — G40909 Epilepsy, unspecified, not intractable, without status epilepticus: Secondary | ICD-10-CM | POA: Insufficient documentation

## 2021-10-05 DIAGNOSIS — D72829 Elevated white blood cell count, unspecified: Secondary | ICD-10-CM | POA: Diagnosis not present

## 2021-10-05 DIAGNOSIS — D508 Other iron deficiency anemias: Secondary | ICD-10-CM | POA: Diagnosis not present

## 2021-10-05 DIAGNOSIS — J45909 Unspecified asthma, uncomplicated: Secondary | ICD-10-CM | POA: Insufficient documentation

## 2021-10-05 DIAGNOSIS — G8929 Other chronic pain: Secondary | ICD-10-CM | POA: Insufficient documentation

## 2021-10-05 DIAGNOSIS — Z7984 Long term (current) use of oral hypoglycemic drugs: Secondary | ICD-10-CM | POA: Diagnosis not present

## 2021-10-05 DIAGNOSIS — R42 Dizziness and giddiness: Secondary | ICD-10-CM | POA: Diagnosis present

## 2021-10-05 DIAGNOSIS — D509 Iron deficiency anemia, unspecified: Secondary | ICD-10-CM | POA: Insufficient documentation

## 2021-10-05 DIAGNOSIS — Z801 Family history of malignant neoplasm of trachea, bronchus and lung: Secondary | ICD-10-CM | POA: Diagnosis not present

## 2021-10-05 DIAGNOSIS — Z79899 Other long term (current) drug therapy: Secondary | ICD-10-CM | POA: Diagnosis not present

## 2021-10-05 DIAGNOSIS — Z803 Family history of malignant neoplasm of breast: Secondary | ICD-10-CM | POA: Insufficient documentation

## 2021-10-05 DIAGNOSIS — I959 Hypotension, unspecified: Secondary | ICD-10-CM | POA: Insufficient documentation

## 2021-10-05 DIAGNOSIS — M797 Fibromyalgia: Secondary | ICD-10-CM | POA: Diagnosis not present

## 2021-10-05 DIAGNOSIS — K760 Fatty (change of) liver, not elsewhere classified: Secondary | ICD-10-CM | POA: Insufficient documentation

## 2021-10-05 DIAGNOSIS — E860 Dehydration: Secondary | ICD-10-CM | POA: Insufficient documentation

## 2021-10-05 LAB — CBC WITH DIFFERENTIAL/PLATELET
Abs Immature Granulocytes: 0.1 10*3/uL — ABNORMAL HIGH (ref 0.00–0.07)
Basophils Absolute: 0.1 10*3/uL (ref 0.0–0.1)
Basophils Relative: 1 %
Eosinophils Absolute: 0.3 10*3/uL (ref 0.0–0.5)
Eosinophils Relative: 2 %
HCT: 44.1 % (ref 36.0–46.0)
Hemoglobin: 13.8 g/dL (ref 12.0–15.0)
Immature Granulocytes: 1 %
Lymphocytes Relative: 23 %
Lymphs Abs: 3.1 10*3/uL (ref 0.7–4.0)
MCH: 28 pg (ref 26.0–34.0)
MCHC: 31.3 g/dL (ref 30.0–36.0)
MCV: 89.6 fL (ref 80.0–100.0)
Monocytes Absolute: 0.6 10*3/uL (ref 0.1–1.0)
Monocytes Relative: 4 %
Neutro Abs: 9.4 10*3/uL — ABNORMAL HIGH (ref 1.7–7.7)
Neutrophils Relative %: 69 %
Platelets: 385 10*3/uL (ref 150–400)
RBC: 4.92 MIL/uL (ref 3.87–5.11)
RDW: 14.6 % (ref 11.5–15.5)
WBC: 13.6 10*3/uL — ABNORMAL HIGH (ref 4.0–10.5)
nRBC: 0 % (ref 0.0–0.2)

## 2021-10-05 LAB — POC URINE PREG, ED: Preg Test, Ur: NEGATIVE

## 2021-10-05 LAB — URINALYSIS, ROUTINE W REFLEX MICROSCOPIC
Bilirubin Urine: NEGATIVE
Glucose, UA: NEGATIVE mg/dL
Hgb urine dipstick: NEGATIVE
Ketones, ur: NEGATIVE mg/dL
Leukocytes,Ua: NEGATIVE
Nitrite: NEGATIVE
Protein, ur: NEGATIVE mg/dL
Specific Gravity, Urine: 1.003 — ABNORMAL LOW (ref 1.005–1.030)
pH: 7 (ref 5.0–8.0)

## 2021-10-05 LAB — LACTIC ACID, PLASMA
Lactic Acid, Venous: 2.1 mmol/L (ref 0.5–1.9)
Lactic Acid, Venous: 2.1 mmol/L (ref 0.5–1.9)

## 2021-10-05 LAB — COMPREHENSIVE METABOLIC PANEL
ALT: 16 U/L (ref 0–44)
AST: 21 U/L (ref 15–41)
Albumin: 3.5 g/dL (ref 3.5–5.0)
Alkaline Phosphatase: 92 U/L (ref 38–126)
Anion gap: 7 (ref 5–15)
BUN: 10 mg/dL (ref 6–20)
CO2: 24 mmol/L (ref 22–32)
Calcium: 9.7 mg/dL (ref 8.9–10.3)
Chloride: 107 mmol/L (ref 98–111)
Creatinine, Ser: 0.78 mg/dL (ref 0.44–1.00)
GFR, Estimated: >60 mL/min (ref >60)
Glucose, Bld: 125 mg/dL — ABNORMAL HIGH (ref 70–99)
Potassium: 3.9 mmol/L (ref 3.5–5.1)
Sodium: 138 mmol/L (ref 135–145)
Total Bilirubin: 0.3 mg/dL (ref 0.3–1.2)
Total Protein: 7.4 g/dL (ref 6.5–8.1)

## 2021-10-05 LAB — MAGNESIUM: Magnesium: 2.2 mg/dL (ref 1.7–2.4)

## 2021-10-05 LAB — TROPONIN I (HIGH SENSITIVITY)
Troponin I (High Sensitivity): 14 ng/L (ref <18)
Troponin I (High Sensitivity): 15 ng/L (ref <18)

## 2021-10-05 LAB — PROTIME-INR
INR: 0.9 (ref 0.8–1.2)
Prothrombin Time: 11.6 seconds (ref 11.4–15.2)

## 2021-10-05 MED ORDER — LACTATED RINGERS IV BOLUS
1000.0000 mL | Freq: Once | INTRAVENOUS | Status: AC
  Administered 2021-10-05: 1000 mL via INTRAVENOUS

## 2021-10-05 MED ORDER — LACTATED RINGERS IV BOLUS
500.0000 mL | Freq: Once | INTRAVENOUS | Status: AC
  Administered 2021-10-05: 500 mL via INTRAVENOUS

## 2021-10-05 NOTE — ED Notes (Signed)
Went over [employer] with pt and family. All questions answered. Wheeled out to lobby.

## 2021-10-05 NOTE — ED Triage Notes (Signed)
Pt sent to ER from Cancer center for IV fluids and work up for possible infection, pt states her WBC is elevated.  Pt also states she was told she is dehydrated.

## 2021-10-05 NOTE — Discharge Instructions (Signed)
Ensure that you eat food and drink plenty of fluids to maintain hydration.  Follow-up with your primary care doctor, this week if possible.  Return to the emergency department for any new or worsening symptoms.

## 2021-10-05 NOTE — ED Provider Notes (Signed)
[facility] EMERGENCY DEPARTMENT Provider Note   CSN: [id number] Arrival date & time: 10/05/21  1513     History {Add pertinent medical, surgical, social history, OB history to HPI:1} Chief Complaint  Patient presents with   Dizziness    BINA VEENSTRA is a 46 y.o. female.   Dizziness Patient presents for dizziness.  Medical history includes chronic hyponatremia, depression, anxiety, seizure disorder, chronic back pain, GERD, HTN, insomnia, prediabetes, HLD, fibromyalgia, neuropathy, bipolar disorder, asthma, cirrhosis, CVA, arthritis, lumbar radiculopathy.  She is currently seen by Dr. Raliegh Ip for evaluation of normocytic anemia. Today she was seen at cancer center clinic for a routine appointment.  At that appointment, she was found to be hypotensive and tachycardic.  She was sent to the ED for IV fluids and further work-up.  Patient Dors is several weeks of fatigue and dizziness.  She denies any recent bleeding.  She has not had diarrhea or vomiting.  She has had a normal p.o. intake and does ensure that she drinks 36 ounces of water every day.  She does not take home blood pressure medications.  She has had ongoing left shoulder pain for the past 3 weeks following a fall.  She denies any other areas of pain or discomfort.     Home Medications Prior to Admission medications   Medication Sig Start Date End Date Taking? Authorizing Provider  albuterol (PROVENTIL HFA;VENTOLIN HFA) 108 (90 BASE) MCG/ACT inhaler Inhale 1-2 puffs into the lungs every 6 (six) hours as needed for wheezing or shortness of breath. 08/23/13   Triplett, Tammy, PA-C  alprazolam Duanne Moron) 2 MG tablet Take by mouth 2 (two) times daily. 08/03/21   [provider]  aspirin EC 81 MG tablet Take 81 mg by mouth daily.    [provider]  atorvastatin (LIPITOR) 80 MG tablet Take 80 mg by mouth daily. 07/02/21   [provider]  azelastine (ASTELIN) 0.1 % nasal spray Place 2 sprays into both nostrils 2 (two)  times daily.     [provider]  Cholecalciferol (VITAMIN D) 125 MCG (5000 UT) CAPS Take 1 capsule by mouth daily. 12/17/19   [provider]  clopidogrel (PLAVIX) 75 MG tablet Take 75 mg by mouth daily.    [provider]  Dextran 70-Hypromellose 0.1-0.3 % SOLN Apply 1 drop to eye every 4 (four) hours as needed (dry eyes).    [provider]  DULoxetine (CYMBALTA) 30 MG capsule Take 30 mg by mouth daily. 08/25/21   [provider]  DULoxetine (CYMBALTA) 60 MG capsule Take 60 mg by mouth daily.    [provider]  FEROSUL 325 (65 Fe) MG tablet Take 325 mg by mouth 2 (two) times daily. 12/17/19   [provider]  gabapentin (NEURONTIN) 300 MG capsule Take 2 capsules (600 mg total) by mouth 3 (three) times daily. For agitation/pain 04/17/20   Lindell Spar I, NP  gabapentin (NEURONTIN) 600 MG tablet Take 600 mg by mouth 3 (three) times daily. 08/25/21   [provider]  ibuprofen (ADVIL) 800 MG tablet Take 800 mg by mouth 3 (three) times daily. 05/28/21   [provider]  levETIRAcetam (KEPPRA) 750 MG tablet Take 1 tablet (750 mg total) by mouth 2 (two) times daily. 07/22/21   Little Ishikawa, MD  metFORMIN (GLUCOPHAGE-XR) 500 MG 24 hr tablet Take 1,000 mg by mouth every evening. 07/02/21   [provider]  methocarbamol (ROBAXIN) 750 MG tablet Take 750 mg by mouth 2 (  two) times daily. 06/10/21   [provider]  nicotine (NICODERM CQ - DOSED IN MG/24 HOURS) 21 mg/24hr patch Place 1 patch (21 mg total) onto the skin daily. (May buy from over the counter: For smoking cessation 04/18/20   Lindell Spar I, NP  nitroGLYCERIN (NITROSTAT) 0.4 MG SL tablet Place 0.4 mg under the tongue every 5 (five) minutes as needed for chest pain.    [provider]  ondansetron (ZOFRAN-ODT) 4 MG disintegrating tablet Take 4 mg by mouth every 8 (eight) hours as needed for nausea or vomiting.    [provider]   oxcarbazepine (TRILEPTAL) 600 MG tablet Take 1 tablet (600 mg total) by mouth 2 (two) times daily. For mood stabilization 04/17/20   Nwoko, Herbert Pun I, NP  pantoprazole (PROTONIX) 40 MG tablet TAKE (1) TABLET BY MOUTH TWICE A DAY BEFORE MEALS. (BREAKFAST AND SUPPER) Patient taking differently: Take 40 mg by mouth 2 (two) times daily before a meal. 04/19/19   Mahala Menghini, PA-C  SUMAtriptan (IMITREX) 50 MG tablet Take 1 tablet (50 mg total) by mouth daily as needed for migraine or headache (first dose now). May repeat in 2 hours if headache persists or recurs. 04/17/20   Lindell Spar I, NP  traMADol (ULTRAM) 50 MG tablet Take 50 mg by mouth every 4 (four) hours as needed. 08/28/21   [provider]  traZODone (DESYREL) 100 MG tablet Take 200 mg by mouth at bedtime as needed. 06/30/21   [provider]  VRAYLAR 3 MG capsule Take 3 mg by mouth daily. 07/02/21   [provider]      Allergies    Zetia [ezetimibe], Zofran [ondansetron hcl], Hydromorphone, Levofloxacin, Moviprep [peg-kcl-nacl-nasulf-na asc-c], Oxycodone, Tape, and Codeine    Review of Systems   Review of Systems  Constitutional:  Positive for fatigue.  Neurological:  Positive for dizziness.  All other systems reviewed and are negative.   Physical Exam Updated Vital Signs BP 100/81 (BP Location: Right Arm)   Pulse (!) 111   Temp 98.3 F (36.8 C) (Oral)   Resp 15   LMP 07/07/2019 (Approximate)   SpO2 95%  Physical Exam Vitals and nursing note reviewed.  Constitutional:      General: She is not in acute distress.    Appearance: Normal appearance. She is well-developed and normal weight. She is not ill-appearing, toxic-appearing or diaphoretic.  HENT:     Head: Normocephalic and atraumatic.     Right Ear: External ear normal.     Left Ear: External ear normal.     Nose: Nose normal.     Mouth/Throat:     Mouth: Mucous membranes are moist.     Pharynx: Oropharynx is clear.  Eyes:     General:  No scleral icterus.    Extraocular Movements: Extraocular movements intact.     Conjunctiva/sclera: Conjunctivae normal.  Cardiovascular:     Rate and Rhythm: Normal rate and regular rhythm.     Heart sounds: No murmur heard. Pulmonary:     Effort: Pulmonary effort is normal. No respiratory distress.     Breath sounds: Normal breath sounds. No wheezing or rales.  Chest:     Chest wall: No tenderness.  Abdominal:     General: There is no distension.     Palpations: Abdomen is soft.     Tenderness: There is no abdominal tenderness.  Musculoskeletal:        General: No swelling. Normal range of motion.  Cervical back: Normal range of motion and neck supple. No rigidity.     Right lower leg: No edema.     Left lower leg: No edema.  Skin:    General: Skin is warm and dry.     Capillary Refill: Capillary refill takes less than 2 seconds.     Coloration: Skin is not jaundiced or pale.  Neurological:     General: No focal deficit present.     Mental Status: She is alert and oriented to person, place, and time.     Cranial Nerves: No cranial nerve deficit.     Sensory: No sensory deficit.     Motor: No weakness.     Coordination: Coordination normal.  Psychiatric:        Mood and Affect: Mood normal.        Behavior: Behavior normal.        Thought Content: Thought content normal.        Judgment: Judgment normal.     ED Results / Procedures / Treatments   Labs (all labs ordered are listed, but only abnormal results are displayed) Labs Reviewed  CBC WITH DIFFERENTIAL/PLATELET  BASIC METABOLIC PANEL    EKG None  Radiology No results found.  Procedures Procedures  {Document cardiac monitor, telemetry assessment procedure when appropriate:1}  Medications Ordered in ED Medications - No data to display  ED Course/ Medical Decision Making/ A&P                           Medical Decision Making Amount and/or Complexity of Data Reviewed Labs: ordered. Radiology:  ordered. ECG/medicine tests: ordered.   This patient presents to the ED for concern of ***, this involves an extensive number of treatment options, and is a complaint that carries with it a high risk of complications and morbidity.  The differential diagnosis includes ***   Co morbidities that complicate the patient evaluation  ***   Additional history obtained:  Additional history obtained from *** External records from outside source obtained and reviewed including ***   Lab Tests:  I Ordered, and personally interpreted labs.  The pertinent results include:  ***   Imaging Studies ordered:  I ordered imaging studies including ***  I independently visualized and interpreted imaging which showed *** I agree with the radiologist interpretation   Cardiac Monitoring: / EKG:  The patient was maintained on a cardiac monitor.  I personally viewed and interpreted the cardiac monitored which showed an underlying rhythm of: ***   Consultations Obtained:  I requested consultation with the ***,  and discussed lab and imaging findings as well as pertinent plan - they recommend: ***   Problem List / ED Course / Critical interventions / Medication management  *** I ordered medication including ***  for ***  Reevaluation of the patient after these medicines showed that the patient {resolved/improved/worsened:23923::"improved"} I have reviewed the patients home medicines and have made adjustments as needed   Social Determinants of Health:  ***   Test / Admission - Considered:  ***   {Document critical care time when appropriate:1} {Document review of labs and clinical decision tools ie heart score, Chads2Vasc2 etc:1}  {Document your independent review of radiology images, and any outside records:1} {Document your discussion with family members, caretakers, and with consultants:1} {Document social determinants of health affecting pt's care:1} {Document your decision  making why or why not admission, treatments were needed:1} Final Clinical Impression(s) / ED Diagnoses Final  diagnoses:  None    Rx / DC Orders ED Discharge Orders     None

## 2021-10-05 NOTE — Patient Instructions (Signed)
[place] at [facility] Discharge Instructions  You were seen today by Tarri Abernethy PA-C for your iron deficiency anemia.    Due to your low iron and low blood levels, we will... Schedule you for IV iron x3 doses Recommend that you continue to take your iron tablet ONCE daily, but take it at a different time of day that you take your Protonix (antacid pill)  Due to your history of high risk colon polyp and new iron deficiency anemia as well as unintentional weight loss, we will... Check a CT scan of your chest, abdomen and pelvis to see if you have any signs of cancer. Refer you back to gastroenterology for repeat colonoscopy and endoscopy  Due to your low blood pressure and high heart rate, we recommend that you go to the emergency department today for IV fluids and work-up of any possible infection.  We will schedule you for additional labs later this week.  FOLLOW-UP APPOINTMENT: Follow-up visit in 1 month.   Thank you for choosing [place] at [facility] to provide your oncology and hematology care.  To afford each patient quality time with our provider, please arrive at least 15 minutes before your scheduled appointment time.   If you have a lab appointment with the [facility] please come in thru the Main Entrance and check in at the main information desk.  You need to re-schedule your appointment should you arrive 10 or more minutes late.  We strive to give you quality time with our providers, and arriving late affects you and other patients whose appointments are after yours.  Also, if you no show three or more times for appointments you may be dismissed from the clinic at the providers discretion.     Again, thank you for choosing [facility].  Our hope is that these requests will decrease the amount of time that you wait before being seen by our physicians.        _____________________________________________________________  Should you have questions after your visit to [facility], please contact our office at [phone] and follow the prompts.  Our office hours are 8:00 a.m. and 4:30 p.m. Monday - Friday.  Please note that voicemails left after 4:00 p.m. may not be returned until the following business day.  We are closed weekends and major holidays.  You do have access to a nurse 24-7, just call the main number to the clinic [phone] and do not press any options, hold on the line and a nurse will answer the phone.    For prescription refill requests, have your pharmacy contact our office and allow 72 hours.    Due to Covid, you will need to wear a mask upon entering the hospital. If you do not have a mask, a mask will be given to you at the Main Entrance upon arrival. For doctor visits, patients may have 1 support person age 65 or older with them. For treatment visits, patients can not have anyone with them due to social distancing guidelines and our immunocompromised population.

## 2021-10-05 NOTE — Progress Notes (Unsigned)
Report called to Benay Pillow, RN in the Emergency Room.  Patient taken down to ER per myself via wheelchair.

## 2021-10-05 NOTE — ED Notes (Signed)
Date and time results received: 10/05/21 1650 (use smartphrase ".now" to insert current time)  Test: lactic Critical Value: 2.1  Name of Provider Notified: Dr. Doren Custard  Orders Received? Or Actions Taken?: no/na

## 2021-10-06 ENCOUNTER — Encounter (HOSPITAL_COMMUNITY): Payer: Self-pay | Admitting: Hematology

## 2021-10-06 DIAGNOSIS — D509 Iron deficiency anemia, unspecified: Secondary | ICD-10-CM | POA: Insufficient documentation

## 2021-10-07 ENCOUNTER — Encounter: Payer: Self-pay | Admitting: Internal Medicine

## 2021-10-08 ENCOUNTER — Inpatient Hospital Stay (HOSPITAL_COMMUNITY): Payer: Medicaid Other

## 2021-10-10 LAB — CULTURE, BLOOD (ROUTINE X 2)
Culture: NO GROWTH
Culture: NO GROWTH
Special Requests: ADEQUATE
Special Requests: ADEQUATE

## 2021-10-15 ENCOUNTER — Inpatient Hospital Stay (HOSPITAL_COMMUNITY): Payer: Medicaid Other

## 2021-10-15 VITALS — BP 92/67 | HR 95 | Temp 97.2°F | Resp 18

## 2021-10-15 DIAGNOSIS — D649 Anemia, unspecified: Secondary | ICD-10-CM

## 2021-10-15 DIAGNOSIS — D509 Iron deficiency anemia, unspecified: Secondary | ICD-10-CM

## 2021-10-15 MED ORDER — SODIUM CHLORIDE 0.9 % IV SOLN
300.0000 mg | Freq: Once | INTRAVENOUS | Status: AC
  Administered 2021-10-15: 300 mg via INTRAVENOUS
  Filled 2021-10-15: qty 300

## 2021-10-15 MED ORDER — ACETAMINOPHEN 325 MG PO TABS
650.0000 mg | ORAL_TABLET | Freq: Once | ORAL | Status: AC
  Administered 2021-10-15: 650 mg via ORAL
  Filled 2021-10-15: qty 2

## 2021-10-15 MED ORDER — SODIUM CHLORIDE 0.9 % IV SOLN: Freq: Once | INTRAVENOUS | Status: AC

## 2021-10-15 MED ORDER — METHYLPREDNISOLONE SODIUM SUCC 125 MG IJ SOLR
125.0000 mg | Freq: Once | INTRAMUSCULAR | Status: AC
  Administered 2021-10-15: 125 mg via INTRAVENOUS
  Filled 2021-10-15: qty 2

## 2021-10-15 MED ORDER — FAMOTIDINE IN NACL 20-0.9 MG/50ML-% IV SOLN
20.0000 mg | Freq: Once | INTRAVENOUS | Status: AC
  Administered 2021-10-15: 20 mg via INTRAVENOUS
  Filled 2021-10-15: qty 50

## 2021-10-15 MED ORDER — LORATADINE 10 MG PO TABS
10.0000 mg | ORAL_TABLET | Freq: Once | ORAL | Status: AC
  Administered 2021-10-15: 10 mg via ORAL
  Filled 2021-10-15: qty 1

## 2021-10-15 NOTE — Patient Instructions (Signed)
[facility]  Discharge Instructions: Thank you for choosing [facility] to provide your oncology and hematology care.  If you have a lab appointment with the Cancer Center, please come in thru the Main Entrance and check in at the main information desk.  Wear comfortable clothing and clothing appropriate for easy access to any Portacath or PICC line.   We strive to give you quality time with your provider. You may need to reschedule your appointment if you arrive late (15 or more minutes).  Arriving late affects you and other patients whose appointments are after yours.  Also, if you miss three or more appointments without notifying the office, you may be dismissed from the clinic at the provider's discretion.      For prescription refill requests, have your pharmacy contact our office and allow 72 hours for refills to be completed.    Today you received the following chemotherapy and/or immunotherapy agents Venofer      To help prevent nausea and vomiting after your treatment, we encourage you to take your nausea medication as directed.  BELOW ARE SYMPTOMS THAT SHOULD BE REPORTED IMMEDIATELY: *FEVER GREATER THAN 100.4 F (38 C) OR HIGHER *CHILLS OR SWEATING *NAUSEA AND VOMITING THAT IS NOT CONTROLLED WITH YOUR NAUSEA MEDICATION *UNUSUAL SHORTNESS OF BREATH *UNUSUAL BRUISING OR BLEEDING *URINARY PROBLEMS (pain or burning when urinating, or frequent urination) *BOWEL PROBLEMS (unusual diarrhea, constipation, pain near the anus) TENDERNESS IN MOUTH AND THROAT WITH OR WITHOUT PRESENCE OF ULCERS (sore throat, sores in mouth, or a toothache) UNUSUAL RASH, SWELLING OR PAIN  UNUSUAL VAGINAL DISCHARGE OR ITCHING   Items with * indicate a potential emergency and should be followed up as soon as possible or go to the Emergency Department if any problems should occur.  Please show the CHEMOTHERAPY ALERT CARD or IMMUNOTHERAPY ALERT CARD at check-in to the Emergency  Department and triage nurse.  Should you have questions after your visit or need to cancel or reschedule your appointment, please contact [facility] [phone]  and follow the prompts.  Office hours are 8:00 a.m. to 4:30 p.m. Monday - Friday. Please note that voicemails left after 4:00 p.m. may not be returned until the following business day.  We are closed weekends and major holidays. You have access to a nurse at all times for urgent questions. Please call the main number to the clinic [phone] and follow the prompts.  For any non-urgent questions, you may also contact your provider using MyChart. We now offer e-Visits for anyone 18 and older to request care online for non-urgent symptoms. For details visit [email or web address].   Also download the MyChart app! Go to the app store, search "MyChart", open the app, select [facility], and log in with your MyChart username and password.  Masks are optional in the cancer centers. If you would like for your care team to wear a mask while they are taking care of you, please let them know. For doctor visits, patients may have with them one support person who is at least 48 years old. At this time, visitors are not allowed in the infusion area.  

## 2021-10-15 NOTE — Progress Notes (Signed)
Patient presents today for Venofer infusion per providers order.  Vital signs WNL.  Patient has no new complaints at this time.    Peripheral IV started and blood return noted pre and post infusion.  On completion of the Venofer her BP was noted to be 88/64 and she is complaining of a headache.  PA notified.  Per PA order patient to receive 500 cc bolus and recheck BP.  BP recheck 92/67and patient states that she feels better, PA notified.  Per PA, pkay to discharge patient.  Patient advised that if she has any chest pain, difficulty breathing, swelling, rash or any other abnormal occurrences to go to the Emergency Department.  Patient agreeable. Discharge from clinic ambulatory in stable condition.  Alert and oriented X 3.  Follow up with [facility] as scheduled.

## 2021-10-23 ENCOUNTER — Inpatient Hospital Stay (HOSPITAL_COMMUNITY): Payer: Medicaid Other

## 2021-10-30 ENCOUNTER — Inpatient Hospital Stay (HOSPITAL_COMMUNITY): Payer: Medicaid Other

## 2021-11-04 ENCOUNTER — Ambulatory Visit (HOSPITAL_COMMUNITY): Payer: Medicaid Other | Admitting: Physician Assistant

## 2021-11-06 ENCOUNTER — Ambulatory Visit (HOSPITAL_COMMUNITY): Payer: Medicaid Other

## 2021-11-06 ENCOUNTER — Other Ambulatory Visit (HOSPITAL_COMMUNITY): Payer: Medicaid Other

## 2021-11-09 ENCOUNTER — Ambulatory Visit (HOSPITAL_COMMUNITY): Payer: Medicaid Other | Admitting: Physician Assistant

## 2021-11-15 IMAGING — DX DG LUMBAR SPINE COMPLETE 4+V
5 series · 5 of 5 positions shown · non-contrast
Comparison: Lumbar spine radiograph dated 08/22/2013.

CLINICAL DATA: 44-year-old female with back pain.

EXAM:
LUMBAR SPINE - COMPLETE 4+ VIEW

[l-spine obl (1 of 2)]
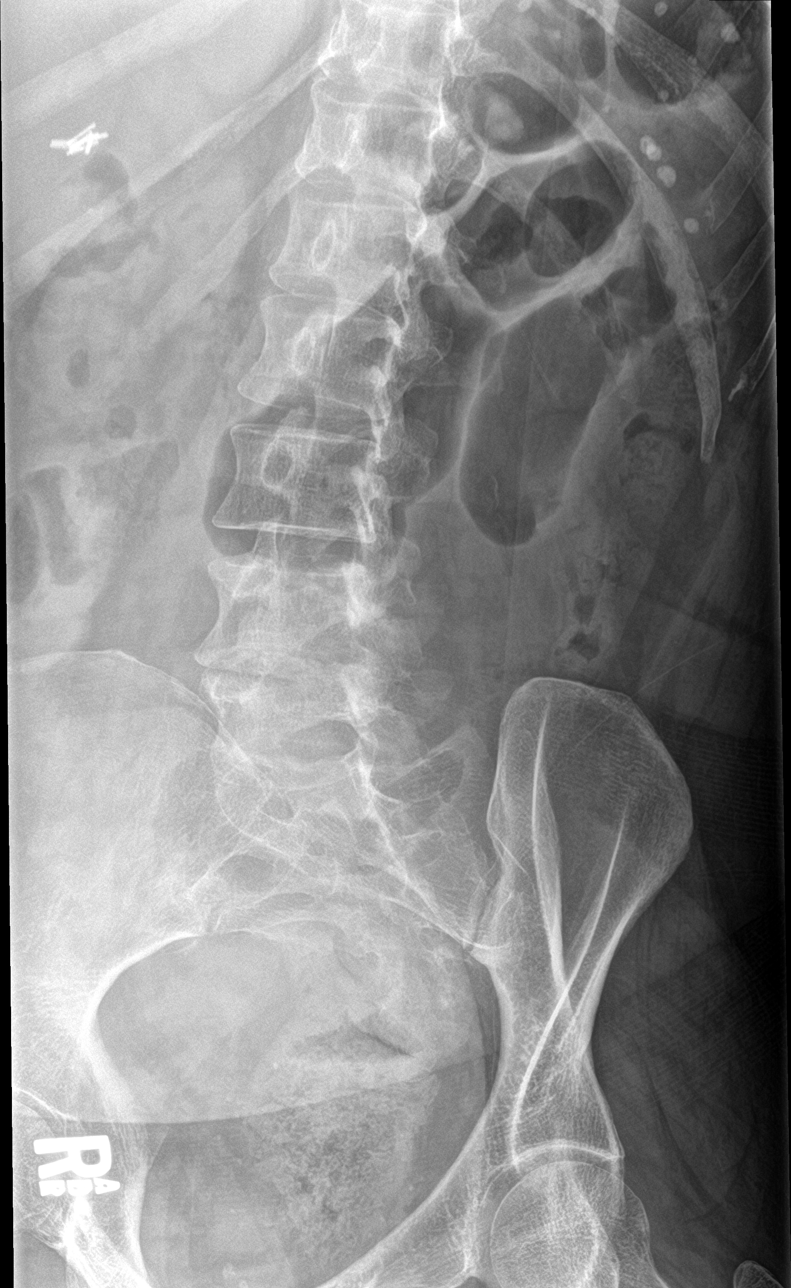

[l-spine obl (2 of 2)]
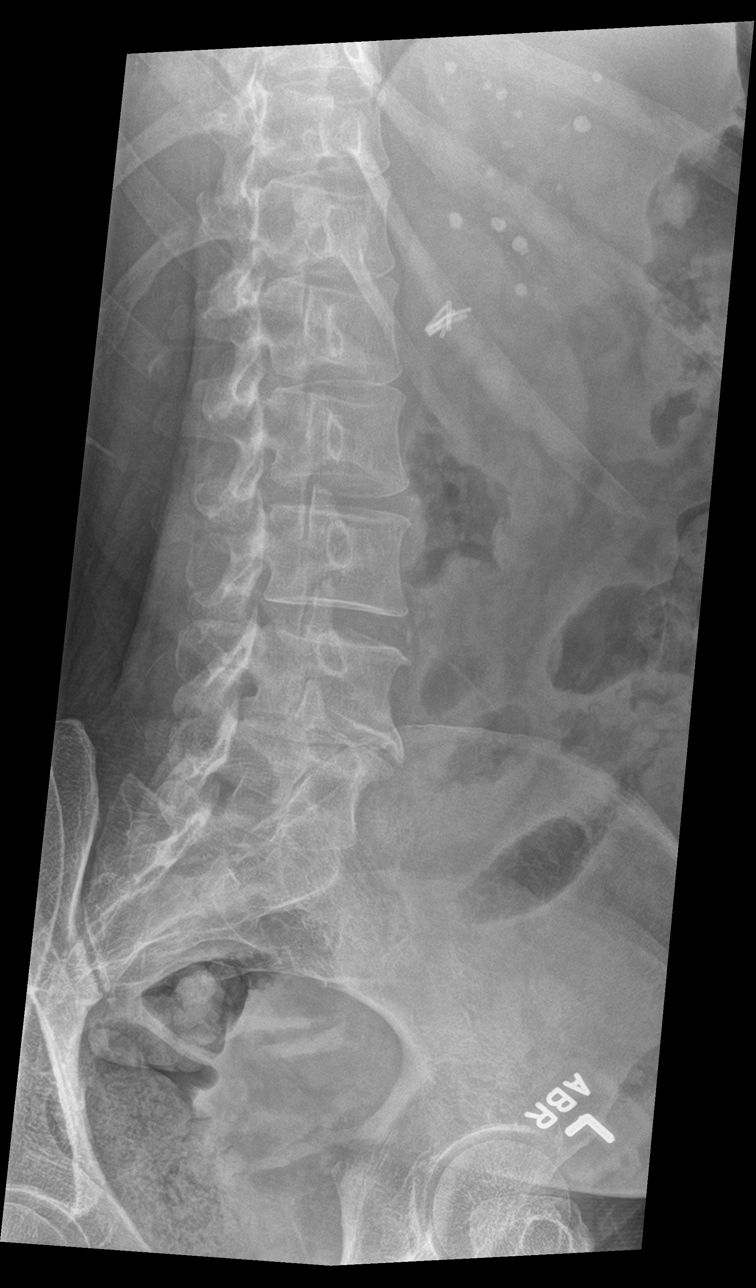

[l-spine lat]
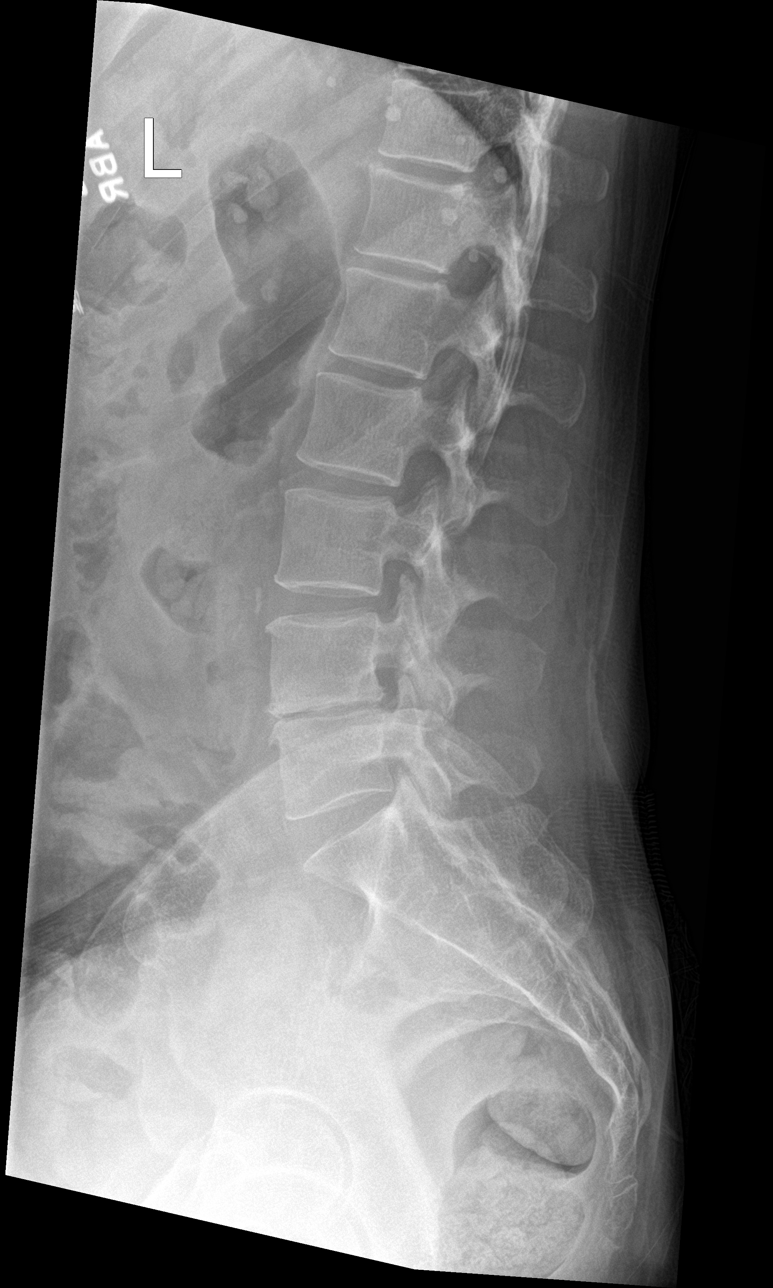

[l-spine spot]
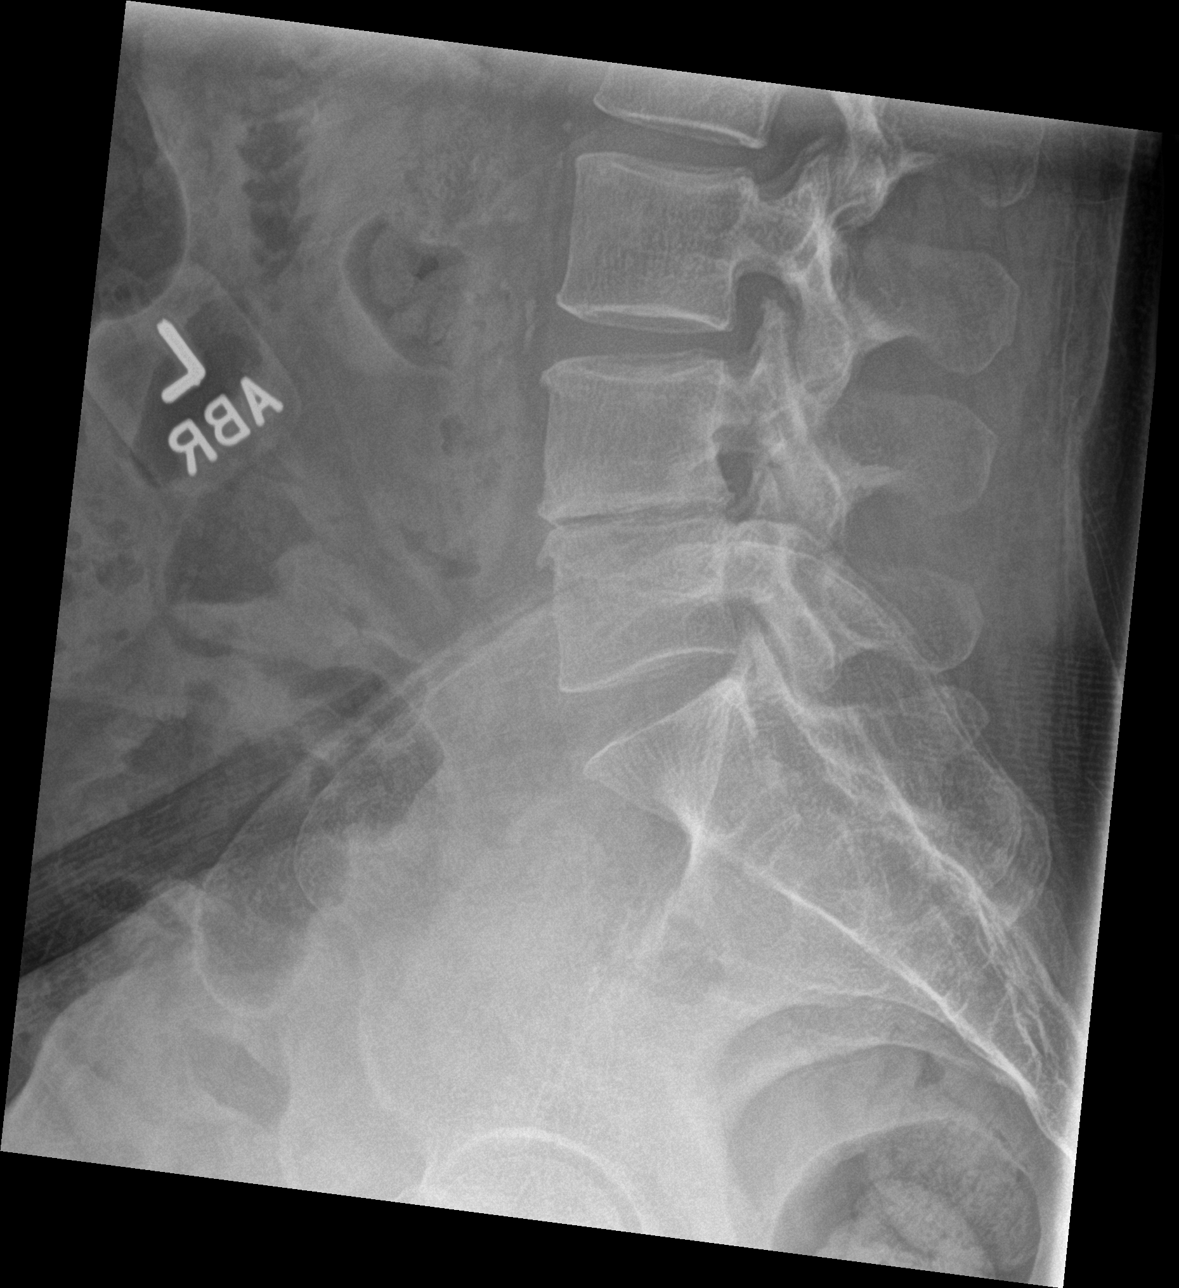

[l-spine ap]
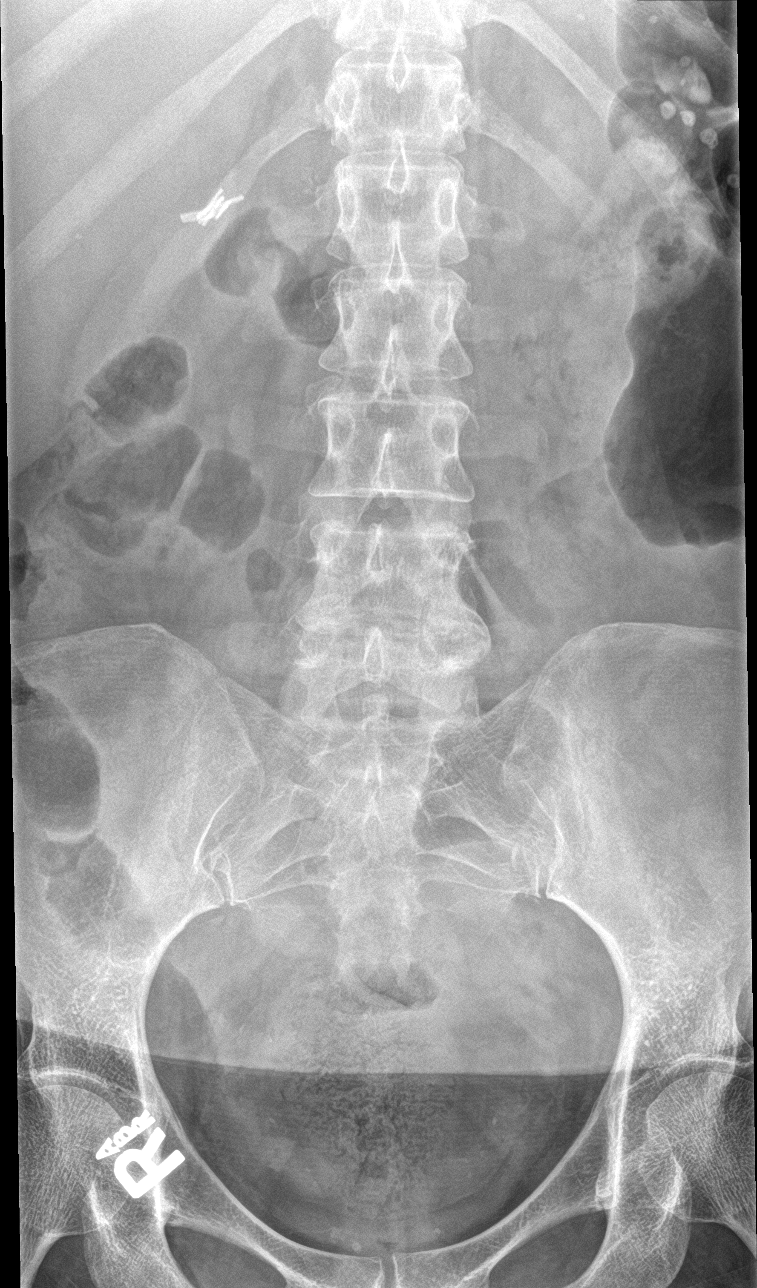

[5 of 5 positions shown; findings below may reference images not displayed]

FINDINGS: Five lumbar type vertebra. There is no acute fracture or subluxation
of the lumbar spine. There is degenerative changes with endplate
irregularity and disc space narrowing at L4-5. The visualized
posterior elements are intact. The soft tissues are unremarkable.
Right upper quadrant cholecystectomy clips.
IMPRESSION: No acute/traumatic lumbar spine pathology.
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
## 2021-11-16 ENCOUNTER — Inpatient Hospital Stay (HOSPITAL_COMMUNITY): Payer: Medicaid Other

## 2021-11-23 ENCOUNTER — Inpatient Hospital Stay: Payer: Medicaid Other | Attending: Physician Assistant

## 2021-11-23 DIAGNOSIS — D72829 Elevated white blood cell count, unspecified: Secondary | ICD-10-CM | POA: Insufficient documentation

## 2021-11-23 DIAGNOSIS — D75839 Thrombocytosis, unspecified: Secondary | ICD-10-CM | POA: Insufficient documentation

## 2021-11-23 DIAGNOSIS — E611 Iron deficiency: Secondary | ICD-10-CM | POA: Insufficient documentation

## 2021-11-23 DIAGNOSIS — D649 Anemia, unspecified: Secondary | ICD-10-CM | POA: Insufficient documentation

## 2021-11-26 ENCOUNTER — Inpatient Hospital Stay: Payer: Medicaid Other

## 2021-11-26 ENCOUNTER — Ambulatory Visit (HOSPITAL_COMMUNITY): Payer: Medicaid Other

## 2021-11-30 ENCOUNTER — Other Ambulatory Visit: Payer: Self-pay | Admitting: Physician Assistant

## 2021-11-30 ENCOUNTER — Inpatient Hospital Stay: Payer: Medicaid Other

## 2021-11-30 VITALS — BP 98/74 | HR 85 | Temp 99.0°F | Resp 18 | Wt 135.7 lb

## 2021-11-30 DIAGNOSIS — E611 Iron deficiency: Secondary | ICD-10-CM | POA: Diagnosis not present

## 2021-11-30 DIAGNOSIS — M62838 Other muscle spasm: Secondary | ICD-10-CM

## 2021-11-30 DIAGNOSIS — D75839 Thrombocytosis, unspecified: Secondary | ICD-10-CM | POA: Diagnosis not present

## 2021-11-30 DIAGNOSIS — D649 Anemia, unspecified: Secondary | ICD-10-CM

## 2021-11-30 DIAGNOSIS — D509 Iron deficiency anemia, unspecified: Secondary | ICD-10-CM

## 2021-11-30 DIAGNOSIS — D72829 Elevated white blood cell count, unspecified: Secondary | ICD-10-CM | POA: Diagnosis not present

## 2021-11-30 MED ORDER — LORATADINE 10 MG PO TABS
10.0000 mg | ORAL_TABLET | Freq: Once | ORAL | Status: AC
  Administered 2021-11-30: 10 mg via ORAL

## 2021-11-30 MED ORDER — SODIUM CHLORIDE 0.9 % IV SOLN: Freq: Once | INTRAVENOUS | Status: AC

## 2021-11-30 MED ORDER — METHYLPREDNISOLONE SODIUM SUCC 125 MG IJ SOLR
125.0000 mg | Freq: Once | INTRAMUSCULAR | Status: AC
  Administered 2021-11-30: 125 mg via INTRAVENOUS

## 2021-11-30 MED ORDER — FAMOTIDINE IN NACL 20-0.9 MG/50ML-% IV SOLN
20.0000 mg | Freq: Once | INTRAVENOUS | Status: AC
  Administered 2021-11-30: 20 mg via INTRAVENOUS

## 2021-11-30 MED ORDER — ACETAMINOPHEN 325 MG PO TABS
650.0000 mg | ORAL_TABLET | Freq: Once | ORAL | Status: AC
  Administered 2021-11-30: 650 mg via ORAL

## 2021-11-30 MED ORDER — SODIUM CHLORIDE 0.9 % IV SOLN
300.0000 mg | Freq: Once | INTRAVENOUS | Status: AC
  Administered 2021-11-30: 300 mg via INTRAVENOUS
  Filled 2021-11-30: qty 300

## 2021-11-30 NOTE — Patient Instructions (Signed)
[facility]  Discharge Instructions: Thank you for choosing [facility] to provide your oncology and hematology care.  If you have a lab appointment with the [facility], please come in thru the Main Entrance and check in at the main information desk.  Wear comfortable clothing and clothing appropriate for easy access to any Portacath or PICC line.   We strive to give you quality time with your provider. You may need to reschedule your appointment if you arrive late (15 or more minutes).  Arriving late affects you and other patients whose appointments are after yours.  Also, if you miss three or more appointments without notifying the office, you may be dismissed from the clinic at the provider's discretion.      For prescription refill requests, have your pharmacy contact our office and allow 72 hours for refills to be completed.    Today you received half bag of Venofer.     BELOW ARE SYMPTOMS THAT SHOULD BE REPORTED IMMEDIATELY: *FEVER GREATER THAN 100.4 F (38 C) OR HIGHER *CHILLS OR SWEATING *NAUSEA AND VOMITING THAT IS NOT CONTROLLED WITH YOUR NAUSEA MEDICATION *UNUSUAL SHORTNESS OF BREATH *UNUSUAL BRUISING OR BLEEDING *URINARY PROBLEMS (pain or burning when urinating, or frequent urination) *BOWEL PROBLEMS (unusual diarrhea, constipation, pain near the anus) TENDERNESS IN MOUTH AND THROAT WITH OR WITHOUT PRESENCE OF ULCERS (sore throat, sores in mouth, or a toothache) UNUSUAL RASH, SWELLING OR PAIN  UNUSUAL VAGINAL DISCHARGE OR ITCHING   Items with * indicate a potential emergency and should be followed up as soon as possible or go to the Emergency Department if any problems should occur.  Please show the CHEMOTHERAPY ALERT CARD or IMMUNOTHERAPY ALERT CARD at check-in to the Emergency Department and triage nurse.  Should you have questions after your visit or need to cancel or reschedule your appointment, please contact [facility] [phone]  and follow the prompts.  Office hours are 8:00 a.m. to 4:30 p.m. Monday - Friday. Please note that voicemails left after 4:00 p.m. may not be returned until the following business day.  We are closed weekends and major holidays. You have access to a nurse at all times for urgent questions. Please call the main number to the clinic [phone] and follow the prompts.  For any non-urgent questions, you may also contact your provider using MyChart. We now offer e-Visits for anyone 104 and older to request care online for non-urgent symptoms. For details visit [email or web address].   Also download the MyChart app! Go to the app store, search "MyChart", open the app, select [facility], and log in with your MyChart username and password.  Masks are optional in the cancer centers. If you would like for your care team to wear a mask while they are taking care of you, please let them know. For doctor visits, patients may have with them one support person who is at least 48 years old. At this time, visitors are not allowed in the infusion area.

## 2021-11-30 NOTE — Progress Notes (Signed)
Hypersensitivity Reaction note  Date of event: 11/30/21 Time of event: 1320 Generic name of drug involved: Iron Sucrose Name of provider notified of the hypersensitivity reaction: Rebekah Pennington,PA Was agent that likely caused hypersensitivity reaction added to Allergies List within EMR? Yes Chain of events:1316- Iron was halfway finished, when RN entered the room and pt stated she was c/o muscle aches all over, specifically in the neck area. Iron was stopped and vital signs obtained see flowsheet and R.Pennington-PA at bedside evaluating pt, and stated to wait 30 minutes before restarting the iron. Pt denies rash, itching, shortness of breath, and nausea. Pt was re-evaluated  in 30 minutes and pt was still c/o muscles aches. R.Pennington-PA made and stated to not to restart the iron. Provider also stated pt will be changed to Feraheme IV iron. Pt made aware and verbalized understanding.   Terrace Arabia, RN 11/30/2021 2:49 PM

## 2021-11-30 NOTE — Progress Notes (Signed)
Pt presents today for Venofer IV iron per provider's order. Vital signs stable and pt voiced no new complaints at this time.  Peripheral IV started with good blood return pre and post infusion.   1316- Iron was halfway finished, when RN entered the room and pt c/o muscle aches all over, specifically in the neck area. Iron was stopped and R.Pennington-PA at bedside to evaluate pt, and stated to wait 30 minutes before restarting the iron to see if symptoms would subside. Pt denies rash, itching, shortness of breath, and nausea. See hypersensitivity note for further details.  Discharged from clinic ambulatory in stable condition. Alert and oriented x 3. F/U with [facility] as scheduled.

## 2021-11-30 NOTE — Progress Notes (Signed)
Notified by RN of adverse reaction during Venofer infusion.  Evaluated patient at bedside.  Patient complained of diffuse body aches with muscle spasms in her neck.  No rash, itching, throat tightness, chest pain, or difficulty breathing.  Venofer infusion was stopped, symptoms persisted after 30 minutes, therefore no further IV Venofer was given during today's visit.  We will reschedule her for IV Feraheme in 1 week, along with premedication.  Patient requested prescription for muscle relaxant, but has active prescription for cyclobenzaprine her as well as high-dose benzodiazepines, narcotic pain medication, and gabapentin at home, therefore additional depressant medications considered to be highly unsafe.

## 2021-12-02 ENCOUNTER — Ambulatory Visit: Payer: Medicaid Other | Admitting: Gastroenterology

## 2021-12-03 ENCOUNTER — Emergency Department: Payer: Medicaid Other

## 2021-12-03 ENCOUNTER — Other Ambulatory Visit: Payer: Self-pay

## 2021-12-03 ENCOUNTER — Inpatient Hospital Stay
Admission: EM | Admit: 2021-12-03 | Discharge: 2021-12-06 | DRG: 315 | Disposition: A | Discharge status: Home / Self Care | Payer: Medicaid Other | Attending: Obstetrics and Gynecology | Admitting: Obstetrics and Gynecology

## 2021-12-03 ENCOUNTER — Ambulatory Visit (HOSPITAL_COMMUNITY): Payer: Medicaid Other | Admitting: Physician Assistant

## 2021-12-03 ENCOUNTER — Encounter: Payer: Self-pay | Admitting: Emergency Medicine

## 2021-12-03 ENCOUNTER — Observation Stay: Payer: Medicaid Other

## 2021-12-03 DIAGNOSIS — K219 Gastro-esophageal reflux disease without esophagitis: Secondary | ICD-10-CM | POA: Diagnosis present

## 2021-12-03 DIAGNOSIS — J449 Chronic obstructive pulmonary disease, unspecified: Secondary | ICD-10-CM | POA: Diagnosis present

## 2021-12-03 DIAGNOSIS — E871 Hypo-osmolality and hyponatremia: Secondary | ICD-10-CM | POA: Diagnosis present

## 2021-12-03 DIAGNOSIS — I959 Hypotension, unspecified: Secondary | ICD-10-CM | POA: Diagnosis not present

## 2021-12-03 DIAGNOSIS — Z808 Family history of malignant neoplasm of other organs or systems: Secondary | ICD-10-CM

## 2021-12-03 DIAGNOSIS — F32A Depression, unspecified: Secondary | ICD-10-CM

## 2021-12-03 DIAGNOSIS — Z803 Family history of malignant neoplasm of breast: Secondary | ICD-10-CM

## 2021-12-03 DIAGNOSIS — E1142 Type 2 diabetes mellitus with diabetic polyneuropathy: Secondary | ICD-10-CM | POA: Diagnosis not present

## 2021-12-03 DIAGNOSIS — M797 Fibromyalgia: Secondary | ICD-10-CM | POA: Diagnosis present

## 2021-12-03 DIAGNOSIS — R079 Chest pain, unspecified: Secondary | ICD-10-CM | POA: Diagnosis not present

## 2021-12-03 DIAGNOSIS — F419 Anxiety disorder, unspecified: Secondary | ICD-10-CM | POA: Diagnosis present

## 2021-12-03 DIAGNOSIS — F319 Bipolar disorder, unspecified: Secondary | ICD-10-CM | POA: Diagnosis present

## 2021-12-03 DIAGNOSIS — Z87891 Personal history of nicotine dependence: Secondary | ICD-10-CM

## 2021-12-03 DIAGNOSIS — Z7982 Long term (current) use of aspirin: Secondary | ICD-10-CM

## 2021-12-03 DIAGNOSIS — A0472 Enterocolitis due to Clostridium difficile, not specified as recurrent: Secondary | ICD-10-CM | POA: Diagnosis present

## 2021-12-03 DIAGNOSIS — Z7984 Long term (current) use of oral hypoglycemic drugs: Secondary | ICD-10-CM

## 2021-12-03 DIAGNOSIS — K7460 Unspecified cirrhosis of liver: Secondary | ICD-10-CM | POA: Diagnosis present

## 2021-12-03 DIAGNOSIS — Z8541 Personal history of malignant neoplasm of cervix uteri: Secondary | ICD-10-CM

## 2021-12-03 DIAGNOSIS — E876 Hypokalemia: Secondary | ICD-10-CM | POA: Diagnosis present

## 2021-12-03 DIAGNOSIS — Z823 Family history of stroke: Secondary | ICD-10-CM

## 2021-12-03 DIAGNOSIS — K760 Fatty (change of) liver, not elsewhere classified: Secondary | ICD-10-CM | POA: Diagnosis present

## 2021-12-03 DIAGNOSIS — G40909 Epilepsy, unspecified, not intractable, without status epilepticus: Secondary | ICD-10-CM | POA: Diagnosis present

## 2021-12-03 DIAGNOSIS — E785 Hyperlipidemia, unspecified: Secondary | ICD-10-CM | POA: Diagnosis present

## 2021-12-03 DIAGNOSIS — I252 Old myocardial infarction: Secondary | ICD-10-CM

## 2021-12-03 DIAGNOSIS — Z83438 Family history of other disorder of lipoprotein metabolism and other lipidemia: Secondary | ICD-10-CM

## 2021-12-03 DIAGNOSIS — Z856 Personal history of leukemia: Secondary | ICD-10-CM

## 2021-12-03 DIAGNOSIS — F17200 Nicotine dependence, unspecified, uncomplicated: Secondary | ICD-10-CM | POA: Diagnosis present

## 2021-12-03 DIAGNOSIS — Z8249 Family history of ischemic heart disease and other diseases of the circulatory system: Secondary | ICD-10-CM

## 2021-12-03 DIAGNOSIS — E559 Vitamin D deficiency, unspecified: Secondary | ICD-10-CM | POA: Diagnosis present

## 2021-12-03 DIAGNOSIS — Z833 Family history of diabetes mellitus: Secondary | ICD-10-CM

## 2021-12-03 DIAGNOSIS — Z8262 Family history of osteoporosis: Secondary | ICD-10-CM

## 2021-12-03 DIAGNOSIS — Z7902 Long term (current) use of antithrombotics/antiplatelets: Secondary | ICD-10-CM

## 2021-12-03 DIAGNOSIS — Z720 Tobacco use: Secondary | ICD-10-CM

## 2021-12-03 DIAGNOSIS — I10 Essential (primary) hypertension: Secondary | ICD-10-CM | POA: Diagnosis present

## 2021-12-03 DIAGNOSIS — Z8673 Personal history of transient ischemic attack (TIA), and cerebral infarction without residual deficits: Secondary | ICD-10-CM

## 2021-12-03 LAB — CBC WITH DIFFERENTIAL/PLATELET
Abs Immature Granulocytes: 0.1 10*3/uL — ABNORMAL HIGH (ref 0.00–0.07)
Basophils Absolute: 0.1 10*3/uL (ref 0.0–0.1)
Basophils Relative: 1 %
Eosinophils Absolute: 0.3 10*3/uL (ref 0.0–0.5)
Eosinophils Relative: 2 %
HCT: 40.4 % (ref 36.0–46.0)
Hemoglobin: 12.5 g/dL (ref 12.0–15.0)
Immature Granulocytes: 1 %
Lymphocytes Relative: 31 %
Lymphs Abs: 4.1 10*3/uL — ABNORMAL HIGH (ref 0.7–4.0)
MCH: 28.9 pg (ref 26.0–34.0)
MCHC: 30.9 g/dL (ref 30.0–36.0)
MCV: 93.3 fL (ref 80.0–100.0)
Monocytes Absolute: 0.7 10*3/uL (ref 0.1–1.0)
Monocytes Relative: 6 %
Neutro Abs: 8.2 10*3/uL — ABNORMAL HIGH (ref 1.7–7.7)
Neutrophils Relative %: 59 %
Platelets: 358 10*3/uL (ref 150–400)
RBC: 4.33 MIL/uL (ref 3.87–5.11)
RDW: 16 % — ABNORMAL HIGH (ref 11.5–15.5)
WBC: 13.5 10*3/uL — ABNORMAL HIGH (ref 4.0–10.5)
nRBC: 0 % (ref 0.0–0.2)

## 2021-12-03 LAB — COMPREHENSIVE METABOLIC PANEL
ALT: 11 U/L (ref 0–44)
AST: 12 U/L — ABNORMAL LOW (ref 15–41)
Albumin: 3.8 g/dL (ref 3.5–5.0)
Alkaline Phosphatase: 87 U/L (ref 38–126)
Anion gap: 9 (ref 5–15)
BUN: 8 mg/dL (ref 6–20)
CO2: 25 mmol/L (ref 22–32)
Calcium: 9 mg/dL (ref 8.9–10.3)
Chloride: 106 mmol/L (ref 98–111)
Creatinine, Ser: 1.07 mg/dL — ABNORMAL HIGH (ref 0.44–1.00)
GFR, Estimated: >60 mL/min (ref >60)
Glucose, Bld: 84 mg/dL (ref 70–99)
Potassium: 2.9 mmol/L — ABNORMAL LOW (ref 3.5–5.1)
Sodium: 140 mmol/L (ref 135–145)
Total Bilirubin: 0.5 mg/dL (ref 0.3–1.2)
Total Protein: 7.3 g/dL (ref 6.5–8.1)

## 2021-12-03 LAB — PROTIME-INR
INR: 1 (ref 0.8–1.2)
Prothrombin Time: 12.7 seconds (ref 11.4–15.2)

## 2021-12-03 LAB — TROPONIN I (HIGH SENSITIVITY)
Troponin I (High Sensitivity): 7 ng/L (ref <18)
Troponin I (High Sensitivity): 8 ng/L (ref <18)

## 2021-12-03 LAB — LIPASE, BLOOD: Lipase: 24 U/L (ref 11–51)

## 2021-12-03 LAB — MAGNESIUM: Magnesium: 2.1 mg/dL (ref 1.7–2.4)

## 2021-12-03 MED ORDER — GABAPENTIN 600 MG PO TABS: 600.0000 mg | ORAL_TABLET | Freq: Three times a day (TID) | ORAL | Status: DC

## 2021-12-03 MED ORDER — PANTOPRAZOLE SODIUM 40 MG PO TBEC: 40.0000 mg | DELAYED_RELEASE_TABLET | Freq: Every day | ORAL | Status: DC

## 2021-12-03 MED ORDER — CYCLOBENZAPRINE HCL 10 MG PO TABS: 10.0000 mg | ORAL_TABLET | Freq: Three times a day (TID) | ORAL | Status: DC | PRN

## 2021-12-03 MED ORDER — IOHEXOL 350 MG/ML SOLN
100.0000 mL | Freq: Once | INTRAVENOUS | Status: AC | PRN
  Administered 2021-12-03: 100 mL via INTRAVENOUS

## 2021-12-03 MED ORDER — POTASSIUM CHLORIDE 10 MEQ/100ML IV SOLN
10.0000 meq | INTRAVENOUS | Status: AC
  Administered 2021-12-03 (×2): 10 meq via INTRAVENOUS
  Filled 2021-12-03 (×2): qty 100

## 2021-12-03 MED ORDER — METHOCARBAMOL 750 MG PO TABS: 750.0000 mg | ORAL_TABLET | Freq: Two times a day (BID) | ORAL | Status: DC

## 2021-12-03 MED ORDER — NITROGLYCERIN 0.4 MG SL SUBL
0.4000 mg | SUBLINGUAL_TABLET | SUBLINGUAL | Status: DC | PRN
Start: 2021-12-03 — End: 2021-12-06

## 2021-12-03 MED ORDER — ACETAMINOPHEN 325 MG PO TABS: 650.0000 mg | ORAL_TABLET | Freq: Four times a day (QID) | ORAL | Status: DC | PRN

## 2021-12-03 MED ORDER — SODIUM CHLORIDE 0.9 % IV BOLUS
1000.0000 mL | Freq: Once | INTRAVENOUS | Status: AC
  Administered 2021-12-03: 1000 mL via INTRAVENOUS

## 2021-12-03 MED ORDER — AZELASTINE HCL 0.1 % NA SOLN
2.0000 | Freq: Two times a day (BID) | NASAL | Status: DC | PRN
Start: 2021-12-03 — End: 2021-12-06

## 2021-12-03 MED ORDER — FAMOTIDINE 20 MG PO TABS
20.0000 mg | ORAL_TABLET | Freq: Every day | ORAL | Status: DC
  Administered 2021-12-03 – 2021-12-06 (×4): 20 mg via ORAL
  Filled 2021-12-03 (×4): qty 1

## 2021-12-03 MED ORDER — POTASSIUM CHLORIDE 20 MEQ PO PACK
40.0000 meq | PACK | Freq: Once | ORAL | Status: AC
  Administered 2021-12-03: 40 meq via ORAL
  Filled 2021-12-03: qty 2

## 2021-12-03 MED ORDER — ALPRAZOLAM 1 MG PO TABS
2.0000 mg | ORAL_TABLET | Freq: Four times a day (QID) | ORAL | Status: DC | PRN
Start: 2021-12-03 — End: 2021-12-06
  Administered 2021-12-04 – 2021-12-06 (×5): 2 mg via ORAL
  Filled 2021-12-03 (×5): qty 2

## 2021-12-03 MED ORDER — SODIUM CHLORIDE 0.9 % IV SOLN: 12.5000 mg | Freq: Four times a day (QID) | INTRAVENOUS | Status: DC | PRN

## 2021-12-03 MED ORDER — TRAMADOL HCL 50 MG PO TABS: 50.0000 mg | ORAL_TABLET | ORAL | Status: DC | PRN

## 2021-12-03 MED ORDER — TRAZODONE HCL 50 MG PO TABS
25.0000 mg | ORAL_TABLET | Freq: Every evening | ORAL | Status: DC | PRN
  Administered 2021-12-05: 25 mg via ORAL
  Filled 2021-12-03: qty 1

## 2021-12-03 MED ORDER — LEVETIRACETAM 750 MG PO TABS
750.0000 mg | ORAL_TABLET | Freq: Two times a day (BID) | ORAL | Status: DC
  Administered 2021-12-03 – 2021-12-06 (×6): 750 mg via ORAL
  Filled 2021-12-03 (×6): qty 1

## 2021-12-03 MED ORDER — DULOXETINE HCL 60 MG PO CPEP
60.0000 mg | ORAL_CAPSULE | Freq: Every day | ORAL | Status: DC
  Administered 2021-12-03: 60 mg via ORAL
  Filled 2021-12-03: qty 1

## 2021-12-03 MED ORDER — MORPHINE SULFATE (PF) 2 MG/ML IV SOLN: 1.0000 mg | INTRAVENOUS | Status: DC | PRN

## 2021-12-03 MED ORDER — CLOPIDOGREL BISULFATE 75 MG PO TABS
75.0000 mg | ORAL_TABLET | Freq: Every day | ORAL | Status: DC
  Administered 2021-12-03 – 2021-12-06 (×4): 75 mg via ORAL
  Filled 2021-12-03 (×4): qty 1

## 2021-12-03 MED ORDER — ALBUTEROL SULFATE (2.5 MG/3ML) 0.083% IN NEBU: 2.5000 mg | INHALATION_SOLUTION | Freq: Four times a day (QID) | RESPIRATORY_TRACT | Status: DC | PRN

## 2021-12-03 MED ORDER — HYDROCODONE-ACETAMINOPHEN 5-325 MG PO TABS
1.0000 | ORAL_TABLET | ORAL | Status: DC | PRN
  Administered 2021-12-05: 1 via ORAL
  Filled 2021-12-03: qty 1

## 2021-12-03 MED ORDER — FERROUS SULFATE 325 (65 FE) MG PO TABS
325.0000 mg | ORAL_TABLET | Freq: Two times a day (BID) | ORAL | Status: DC
  Administered 2021-12-03 – 2021-12-06 (×6): 325 mg via ORAL
  Filled 2021-12-03 (×6): qty 1

## 2021-12-03 MED ORDER — PANTOPRAZOLE SODIUM 40 MG PO TBEC
40.0000 mg | DELAYED_RELEASE_TABLET | Freq: Every day | ORAL | Status: DC
  Administered 2021-12-03 – 2021-12-06 (×4): 40 mg via ORAL
  Filled 2021-12-03 (×4): qty 1

## 2021-12-03 MED ORDER — ATORVASTATIN CALCIUM 20 MG PO TABS
80.0000 mg | ORAL_TABLET | Freq: Every day | ORAL | Status: DC
  Administered 2021-12-03 – 2021-12-06 (×4): 80 mg via ORAL
  Filled 2021-12-03 (×4): qty 4

## 2021-12-03 MED ORDER — DULOXETINE HCL 30 MG PO CPEP
30.0000 mg | ORAL_CAPSULE | Freq: Every day | ORAL | Status: DC
  Administered 2021-12-03 – 2021-12-06 (×4): 30 mg via ORAL
  Filled 2021-12-03 (×4): qty 1

## 2021-12-03 MED ORDER — MAGNESIUM HYDROXIDE 400 MG/5ML PO SUSP: 30.0000 mL | Freq: Every day | ORAL | Status: DC | PRN

## 2021-12-03 MED ORDER — ENOXAPARIN SODIUM 40 MG/0.4ML IJ SOSY
40.0000 mg | PREFILLED_SYRINGE | INTRAMUSCULAR | Status: DC
  Administered 2021-12-03 – 2021-12-05 (×3): 40 mg via SUBCUTANEOUS
  Filled 2021-12-03 (×3): qty 0.4

## 2021-12-03 MED ORDER — ACETAMINOPHEN 650 MG RE SUPP: 650.0000 mg | Freq: Four times a day (QID) | RECTAL | Status: DC | PRN

## 2021-12-03 MED ORDER — MIDODRINE HCL 5 MG PO TABS
10.0000 mg | ORAL_TABLET | Freq: Three times a day (TID) | ORAL | Status: DC
  Administered 2021-12-03: 10 mg via ORAL
  Filled 2021-12-03: qty 2

## 2021-12-03 MED ORDER — FLUTICASONE PROPIONATE 50 MCG/ACT NA SUSP: 2.0000 | Freq: Every day | NASAL | Status: DC

## 2021-12-03 MED ORDER — SODIUM CHLORIDE 0.9 % IV SOLN: INTRAVENOUS | Status: DC

## 2021-12-03 NOTE — Assessment & Plan Note (Signed)
-   I counseled her smoking cessation and she will receive further counseling here.

## 2021-12-03 NOTE — ED Triage Notes (Signed)
Patient to ED from neurologist for hypotension and tachycardia. Patient states that centralized CP and started radiating on left arm started approx 45 minutes PTA. Patient also c/o headache, lightheadedness, and dizzy.

## 2021-12-03 NOTE — Assessment & Plan Note (Signed)
-   We will continue statin therapy. 

## 2021-12-03 NOTE — ED Provider Triage Note (Signed)
Emergency Medicine Provider Triage Evaluation Note  Lauren Cross , a 46 y.o. female  was evaluated in triage.  Pt complains of patient presents with CP radiating to L arm. This started 45 minutes ago. This radiates to L arm. Also having HA. Saw neurologist today and developed CP there and vitals revealed tachycardia and hypotension  Review of Systems  Positive: CP, HA Negative: SOB, N/V/D  Physical Exam  LMP 07/07/2019 (Approximate)  Gen:   Awake, no distress   Resp:  Normal effort  MSK:   Moves extremities without difficulty  Other:    Medical Decision Making  Medically screening exam initiated at 5:35 PM.  Appropriate orders placed.  Lauren Cross was informed that the remainder of the evaluation will be completed by another provider, this initial triage assessment does not replace that evaluation, and the importance of remaining in the ED until their evaluation is complete.  Presents to the ED for sudden onset of chest pain, as well as headache.  Patient was at her neurologist, noted to be hypertensive as well as tachycardic.  Patient has a systolic blood pressure in the 60s here in the emergency department.  History of previous CVAs.  Patient on labs, EKG, CT head, chest x-ray at this time.  Patient will be the next patient roomed   Brynda Peon 12/03/21 1739

## 2021-12-03 NOTE — ED Notes (Signed)
CT contacted regarding patient's CTA. State patient may come to CT if stable.

## 2021-12-03 NOTE — ED Notes (Signed)
Assumed care of patient. Patient AOX4. Resp even, unlabored on RA with NSR on monitor with PVC's. Reports chest pain 8/10 that radiates to left chest and to back. BP notably higher in right arm than in left arm.
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
## 2021-12-03 NOTE — ED Provider Notes (Signed)
[facility] Provider Note    Event Date/Time   First MD Initiated Contact with Patient 12/03/21 1755     (approximate)   History   Chief Complaint: Chest Pain   HPI  Lauren Cross is a 46 y.o. female with a history of seizure disorder, bipolar disorder, hyperlipidemia, MI who comes the ED complaining of chest pain that started suddenly around 4:00 PM today.  Constant, nonradiating.  No  alleviating factors.  Worse with deep breathing.  Associated with shortness of breath.  Feels tight and sharp.  No fever or cough.  No trauma.  Feels like prior heart attack.     Physical Exam   Triage Vital Signs: ED Triage Vitals [12/03/21 1738]  Enc Vitals Group     BP (!) 66/46     Pulse Rate (!) 56     Resp 18     Temp 97.7 F (36.5 C)     Temp Source Oral     SpO2 99 %     Weight 135 lb (61.2 kg)     Height '5\' 5"'$  (1.651 m)     Head Circumference      Peak Flow      Pain Score 8     Pain Loc      Pain Edu?      Excl. in [place]?     Most recent vital signs: Vitals:   12/03/21 1738  BP: (!) 66/46  Pulse: (!) 56  Resp: 18  Temp: 97.7 F (36.5 C)  SpO2: 99%    General: Awake, no distress.  CV:  Good peripheral perfusion.  Regular rate and rhythm.  Symmetric peripheral pulses. Resp:  Normal effort.  Clear to auscultation bilaterally Abd:  No distention.  Soft nontender Other:  No lower extremity edema or calf tenderness.  Symmetric calf circumference.   ED Results / Procedures / Treatments   Labs (all labs ordered are listed, but only abnormal results are displayed) Labs Reviewed  CBC WITH DIFFERENTIAL/PLATELET - Abnormal; Notable for the following components:      Result Value   WBC 13.5 (*)    RDW 16.0 (*)    Neutro Abs 8.2 (*)    Lymphs Abs 4.1 (*)    Abs Immature Granulocytes 0.10 (*)    All other components within normal limits  COMPREHENSIVE METABOLIC PANEL  LIPASE, BLOOD  PROTIME-INR  URINALYSIS, ROUTINE W REFLEX MICROSCOPIC  BASIC  METABOLIC PANEL  CBC  TROPONIN I (HIGH SENSITIVITY)     EKG Interpreted by me Sinus rhythm rate of 81.  Normal axis and intervals.  Poor R wave progression.  Normal ST segments and T waves.  4 PVCs on the strip.   RADIOLOGY Chest x-ray interpreted by me, appears normal.  No pneumothorax pleural effusion or consolidation.  Radiology report reviewed.  CT head pending.  CT angio chest pending.   PROCEDURES:  .1-3 Lead EKG Interpretation  Performed by: Carrie Mew, MD Authorized by: Carrie Mew, MD     Interpretation: non-specific     ECG rate:  80   ECG rate assessment: normal     Rhythm: sinus rhythm     Ectopy: PVCs     Conduction: normal   Comments:        .Critical Care  Performed by: Carrie Mew, MD Authorized by: Carrie Mew, MD   Critical care provider statement:    Critical care time (minutes):  35   Critical care time was exclusive of:  Separately billable procedures and treating other patients   Critical care was necessary to treat or prevent imminent or life-threatening deterioration of the following conditions:  Circulatory failure, shock and cardiac failure   Critical care was time spent personally by me on the following activities:  Development of treatment plan with patient or surrogate, discussions with consultants, evaluation of patient's response to treatment, examination of patient, obtaining history from patient or surrogate, ordering and performing treatments and interventions, ordering and review of laboratory studies, ordering and review of radiographic studies, pulse oximetry, re-evaluation of patient's condition and review of old charts   Care discussed with: admitting provider      MEDICATIONS ORDERED IN ED: Medications  HYDROcodone-acetaminophen (NORCO/VICODIN) 5-325 MG per tablet 1 tablet (has no administration in time range)  traMADol (ULTRAM) tablet 50 mg (has no administration in time range)  atorvastatin (LIPITOR)  tablet 80 mg (has no administration in time range)  nitroGLYCERIN (NITROSTAT) SL tablet 0.4 mg (has no administration in time range)  ALPRAZolam (XANAX) tablet 2 mg (has no administration in time range)  DULoxetine (CYMBALTA) DR capsule 60 mg (has no administration in time range)  DULoxetine (CYMBALTA) DR capsule 30 mg (has no administration in time range)  famotidine (PEPCID) tablet 20 mg (has no administration in time range)  pantoprazole (PROTONIX) EC tablet 40 mg (has no administration in time range)  clopidogrel (PLAVIX) tablet 75 mg (has no administration in time range)  ferrous sulfate tablet 325 mg (has no administration in time range)  cyclobenzaprine (FLEXERIL) tablet 10 mg (has no administration in time range)  methocarbamol (ROBAXIN) tablet 750 mg (has no administration in time range)  levETIRAcetam (KEPPRA) tablet 750 mg (has no administration in time range)  gabapentin (NEURONTIN) tablet 600 mg (has no administration in time range)  albuterol (VENTOLIN HFA) 108 (90 Base) MCG/ACT inhaler 1-2 puff (has no administration in time range)  azelastine (ASTELIN) 0.1 % nasal spray 2 spray (has no administration in time range)  fluticasone (FLONASE) 50 MCG/ACT nasal spray 2 spray (has no administration in time range)  enoxaparin (LOVENOX) injection 40 mg (has no administration in time range)  0.9 %  sodium chloride infusion (has no administration in time range)  acetaminophen (TYLENOL) tablet 650 mg (has no administration in time range)    Or  acetaminophen (TYLENOL) suppository 650 mg (has no administration in time range)  traZODone (DESYREL) tablet 25 mg (has no administration in time range)  magnesium hydroxide (MILK OF MAGNESIA) suspension 30 mL (has no administration in time range)  promethazine (PHENERGAN) 12.5 mg in sodium chloride 0.9 % 50 mL IVPB (has no administration in time range)  morphine (PF) 2 MG/ML injection 1-2 mg (has no administration in time range)  sodium chloride  0.9 % bolus 1,000 mL (1,000 mLs Intravenous New Bag/Given 12/03/21 1741)     IMPRESSION / MDM / [place] / ED COURSE  I reviewed the triage vital signs and the nursing notes.                              Differential diagnosis includes, but is not limited to, non-STEMI, pulmonary embolism, pneumonia, pneumothorax, dehydration, AKI, electrolyte abnormality, pancreatitis  Patient's presentation is most consistent with acute presentation with potential threat to life or bodily function.  Patient presents with acute onset of central chest pain associated with hypotension.  Patient receiving 2 L IV fluid bolus for resuscitation and volume replacement.  Will  obtain CT angiogram of the chest along with labs.  Will need to admit for further stabilization and work-up.       FINAL CLINICAL IMPRESSION(S) / ED DIAGNOSES   Final diagnoses:  Hypotension, unspecified hypotension type  Chest pain, unspecified type     Rx / DC Orders   ED Discharge Orders     None        Note:  This document was prepared using Dragon voice recognition software and may include unintentional dictation errors.   Carrie Mew, MD 12/03/21 [phone]

## 2021-12-03 NOTE — Assessment & Plan Note (Signed)
-   In the setting of chest pain we will need to rule out PE. - We will obtain a chest CTA. - She will be aggressively hydrated with IV normal saline. - We will continue monitoring her BP.

## 2021-12-03 NOTE — Assessment & Plan Note (Signed)
-   We will continue PPI therapy 

## 2021-12-03 NOTE — Assessment & Plan Note (Signed)
-   We will continue her Xanax as well as Cymbalta.

## 2021-12-03 NOTE — Assessment & Plan Note (Addendum)
. -   The patient will be placed on supplement coverage with NovoLog. - We will hold off metformin. - We will continue her Neurontin.

## 2021-12-03 NOTE — Assessment & Plan Note (Signed)
- 

## 2021-12-03 NOTE — ED Notes (Signed)
Patient in CT at this time.

## 2021-12-03 NOTE — Assessment & Plan Note (Signed)
-   The patient will be admitted to a progressive unit observation bed. - She has several cardiac risk factors.  Will need to rule out acute coronary syndrome. - We will follow serial troponins. - She will be placed on as needed sublingual nitroglycerin and IV morphine sulfate with improvement of her blood pressure. - We will place her on aspirin as well as statin therapy. - Cardiology consult will be obtained. - I notified Dr. Saralyn Pilar about the patient.

## 2021-12-04 DIAGNOSIS — K7460 Unspecified cirrhosis of liver: Secondary | ICD-10-CM | POA: Diagnosis present

## 2021-12-04 DIAGNOSIS — K219 Gastro-esophageal reflux disease without esophagitis: Secondary | ICD-10-CM | POA: Diagnosis present

## 2021-12-04 DIAGNOSIS — E1142 Type 2 diabetes mellitus with diabetic polyneuropathy: Secondary | ICD-10-CM | POA: Diagnosis present

## 2021-12-04 DIAGNOSIS — I252 Old myocardial infarction: Secondary | ICD-10-CM | POA: Diagnosis not present

## 2021-12-04 DIAGNOSIS — Z833 Family history of diabetes mellitus: Secondary | ICD-10-CM | POA: Diagnosis not present

## 2021-12-04 DIAGNOSIS — F419 Anxiety disorder, unspecified: Secondary | ICD-10-CM | POA: Diagnosis present

## 2021-12-04 DIAGNOSIS — Z83438 Family history of other disorder of lipoprotein metabolism and other lipidemia: Secondary | ICD-10-CM | POA: Diagnosis not present

## 2021-12-04 DIAGNOSIS — R079 Chest pain, unspecified: Secondary | ICD-10-CM | POA: Diagnosis present

## 2021-12-04 DIAGNOSIS — Z8262 Family history of osteoporosis: Secondary | ICD-10-CM | POA: Diagnosis not present

## 2021-12-04 DIAGNOSIS — A0472 Enterocolitis due to Clostridium difficile, not specified as recurrent: Secondary | ICD-10-CM | POA: Diagnosis present

## 2021-12-04 DIAGNOSIS — E785 Hyperlipidemia, unspecified: Secondary | ICD-10-CM | POA: Diagnosis present

## 2021-12-04 DIAGNOSIS — M797 Fibromyalgia: Secondary | ICD-10-CM | POA: Diagnosis present

## 2021-12-04 DIAGNOSIS — F319 Bipolar disorder, unspecified: Secondary | ICD-10-CM | POA: Diagnosis present

## 2021-12-04 DIAGNOSIS — E871 Hypo-osmolality and hyponatremia: Secondary | ICD-10-CM | POA: Diagnosis present

## 2021-12-04 DIAGNOSIS — K760 Fatty (change of) liver, not elsewhere classified: Secondary | ICD-10-CM | POA: Diagnosis present

## 2021-12-04 DIAGNOSIS — E876 Hypokalemia: Secondary | ICD-10-CM | POA: Diagnosis present

## 2021-12-04 DIAGNOSIS — J449 Chronic obstructive pulmonary disease, unspecified: Secondary | ICD-10-CM | POA: Diagnosis present

## 2021-12-04 DIAGNOSIS — Z8541 Personal history of malignant neoplasm of cervix uteri: Secondary | ICD-10-CM | POA: Diagnosis not present

## 2021-12-04 DIAGNOSIS — Z803 Family history of malignant neoplasm of breast: Secondary | ICD-10-CM | POA: Diagnosis not present

## 2021-12-04 DIAGNOSIS — Z8249 Family history of ischemic heart disease and other diseases of the circulatory system: Secondary | ICD-10-CM | POA: Diagnosis not present

## 2021-12-04 DIAGNOSIS — Z823 Family history of stroke: Secondary | ICD-10-CM | POA: Diagnosis not present

## 2021-12-04 DIAGNOSIS — I10 Essential (primary) hypertension: Secondary | ICD-10-CM | POA: Diagnosis present

## 2021-12-04 DIAGNOSIS — I959 Hypotension, unspecified: Secondary | ICD-10-CM | POA: Diagnosis present

## 2021-12-04 DIAGNOSIS — Z808 Family history of malignant neoplasm of other organs or systems: Secondary | ICD-10-CM | POA: Diagnosis not present

## 2021-12-04 DIAGNOSIS — G40909 Epilepsy, unspecified, not intractable, without status epilepticus: Secondary | ICD-10-CM | POA: Diagnosis present

## 2021-12-04 LAB — URINE DRUG SCREEN, QUALITATIVE (ARMC ONLY)
Amphetamines, Ur Screen: NOT DETECTED
Barbiturates, Ur Screen: NOT DETECTED
Benzodiazepine, Ur Scrn: POSITIVE — AB
Cannabinoid 50 Ng, Ur ~~LOC~~: NOT DETECTED
Cocaine Metabolite,Ur ~~LOC~~: NOT DETECTED
MDMA (Ecstasy)Ur Screen: NOT DETECTED
Methadone Scn, Ur: NOT DETECTED
Opiate, Ur Screen: NOT DETECTED
Phencyclidine (PCP) Ur S: NOT DETECTED
Tricyclic, Ur Screen: NOT DETECTED

## 2021-12-04 LAB — URINALYSIS, ROUTINE W REFLEX MICROSCOPIC
Bilirubin Urine: NEGATIVE
Glucose, UA: NEGATIVE mg/dL
Hgb urine dipstick: NEGATIVE
Ketones, ur: NEGATIVE mg/dL
Leukocytes,Ua: NEGATIVE
Nitrite: NEGATIVE
Protein, ur: NEGATIVE mg/dL
Specific Gravity, Urine: 1.016 (ref 1.005–1.030)
pH: 6 (ref 5.0–8.0)

## 2021-12-04 LAB — GLUCOSE, CAPILLARY
Glucose-Capillary: 113 mg/dL — ABNORMAL HIGH (ref 70–99)
Glucose-Capillary: 107 mg/dL — ABNORMAL HIGH (ref 70–99)

## 2021-12-04 LAB — BASIC METABOLIC PANEL
Anion gap: 4 — ABNORMAL LOW (ref 5–15)
BUN: 7 mg/dL (ref 6–20)
CO2: 21 mmol/L — ABNORMAL LOW (ref 22–32)
Calcium: 8.1 mg/dL — ABNORMAL LOW (ref 8.9–10.3)
Chloride: 118 mmol/L — ABNORMAL HIGH (ref 98–111)
Creatinine, Ser: 1.05 mg/dL — ABNORMAL HIGH (ref 0.44–1.00)
GFR, Estimated: >60 mL/min (ref >60)
Glucose, Bld: 89 mg/dL (ref 70–99)
Potassium: 4 mmol/L (ref 3.5–5.1)
Sodium: 143 mmol/L (ref 135–145)

## 2021-12-04 LAB — CBC
HCT: 35.8 % — ABNORMAL LOW (ref 36.0–46.0)
Hemoglobin: 11 g/dL — ABNORMAL LOW (ref 12.0–15.0)
MCH: 28.9 pg (ref 26.0–34.0)
MCHC: 30.7 g/dL (ref 30.0–36.0)
MCV: 94 fL (ref 80.0–100.0)
Platelets: 312 10*3/uL (ref 150–400)
RBC: 3.81 MIL/uL — ABNORMAL LOW (ref 3.87–5.11)
RDW: 15.8 % — ABNORMAL HIGH (ref 11.5–15.5)
WBC: 11.6 10*3/uL — ABNORMAL HIGH (ref 4.0–10.5)
nRBC: 0 % (ref 0.0–0.2)

## 2021-12-04 MED ORDER — GABAPENTIN 600 MG PO TABS
600.0000 mg | ORAL_TABLET | Freq: Three times a day (TID) | ORAL | Status: DC
  Administered 2021-12-04 – 2021-12-06 (×7): 600 mg via ORAL
  Filled 2021-12-04 (×7): qty 1

## 2021-12-04 MED ORDER — METFORMIN HCL ER 500 MG PO TB24
1000.0000 mg | ORAL_TABLET | Freq: Every day | ORAL | Status: DC
  Filled 2021-12-04: qty 2

## 2021-12-04 MED ORDER — CARIPRAZINE HCL 1.5 MG PO CAPS
3.0000 mg | ORAL_CAPSULE | Freq: Every day | ORAL | Status: DC
  Administered 2021-12-04 – 2021-12-06 (×3): 3 mg via ORAL
  Filled 2021-12-04: qty 1
  Filled 2021-12-04 (×3): qty 2

## 2021-12-04 MED ORDER — ONDANSETRON 4 MG PO TBDP
4.0000 mg | ORAL_TABLET | Freq: Three times a day (TID) | ORAL | Status: DC | PRN
Start: 2021-12-04 — End: 2021-12-06

## 2021-12-04 MED ORDER — INSULIN ASPART 100 UNIT/ML IJ SOLN
0.0000 [IU] | Freq: Three times a day (TID) | INTRAMUSCULAR | Status: DC
  Administered 2021-12-05 – 2021-12-06 (×2): 1 [IU] via SUBCUTANEOUS
  Filled 2021-12-04 (×2): qty 1

## 2021-12-04 MED ORDER — INSULIN ASPART 100 UNIT/ML IJ SOLN: 0.0000 [IU] | Freq: Every day | INTRAMUSCULAR | Status: DC

## 2021-12-04 NOTE — ED Notes (Signed)
 Fee notified via secure chat that patient continues to have wide variations in blood pressure readings in each arm and that last BP in left arm was 56/44 and BP to right arm 98/77. Provider also notified that patient has been sleeping but wakes up to verbal stimuli and when asked if she is having chest pain reports she still has pain of 7/10. No new orders received at this time.

## 2021-12-04 NOTE — Progress Notes (Signed)
PROGRESS NOTE    Lauren Cross  NOM:[id number] DOB: 02-Apr-1976 DOA: 12/03/2021 PCP: Lauren Schwalbe, MD     Brief Narrative:  From admission h and p Lauren Cross is a 46 y.o. Caucasian female with medical history significant for asthma, bipolar disorder, chronic back pain, depression, fibromyalgia, GERD, type 2 diabetes mellitus, ongoing tobacco abuse, peripheral neuropathy, seizure disorder, COPD and anxiety, who presented to the ER with acute onset of midsternal chest pain felt as pressure and graded 7/10 in severity with associated nausea without vomiting or diaphoresis.  She denied any cough or wheezing or hemoptysis.  No leg pain or edema or recent travels or surgeries.  She gets iron transfusion for anemia and her last transfusion was on 8/14.  She was seen by her neurologist today when she developed hypotension and tachycardia with associated chest pain.  She admitted to radiation to her left arm and it lasted about 45 minutes prior to arrival to the ED.  She was also complaining of lightheadedness and dizziness with associated headache.  No paresthesias or focal muscle weakness.  No dysuria, oliguria or hematuria or flank pain.  No other bleeding diathesis.   Assessment & Plan:   Principal Problem:   Hypotension Active Problems:   Chest pain   Chronic hyponatremia   HTN (hypertension)   Seizure disorder (HCC)   Current smoker   Bipolar disorder ([place])   Cirrhosis of liver (HCC)   History of CVA (cerebrovascular accident)   Tobacco abuse   Dyslipidemia   Anxiety and depression   Type 2 diabetes mellitus with peripheral neuropathy (HCC)   GERD without esophagitis  # Hypotension Etiology unclear. Though on multiple meds that can contribute, though denies misuse. Did complain of chest pain though this is resolved and troponins negative. No sig rhythm disturbance on ekg. No vomiting or diarrhea or reduced po to suggest volume loss. She was hypokalemic but this has resolved with  supplementation. No infectious symptoms. Hypotension has resolved with fluid resuscitation - f/u blood cultures - orthostats - pharmacy med review - will monitor off fluids today - PT to ambulate - UDS  # Chest pain Resolved. No hx of CAD. Serial troponins and ekg negative - monitor tele  # HTN Not on meds at home, here hypotensive  # History CVA Nothing acute seen on CT - cont home atorva, plavix  # Bipolar - cont home vraylar, ativan  # Chronic pain - home gabapentin, duloxetine, norco, tramadol  # Iron deficiency No sig anemia - home iron  # Seizure disorder No report of recent seizure-like activity - home keppra  # T2DM Glucose appropriate - home metformin      DVT prophylaxis: lovenox Code Status: full Family Communication: none at bedside, no answer when mother called  Level of care: Progressive Status is: Observation The patient will require care spanning > 2 midnights and should be moved to inpatient because: need for inpatient monitoring    Consultants:  none  Procedures: none  Antimicrobials:  none    Subjective: This morning chest pain resolved, feeling well  Objective: Vitals:   12/04/21 0444 12/04/21 0630 12/04/21 0700 12/04/21 0900  BP: 93/61 119/78 115/77   Pulse: 86 78 85   Resp: '20 15 15   '$ Temp:    98.1 F (36.7 C)  TempSrc:    Oral  SpO2: 95% 93% 95%   Weight:      Height:        Intake/Output Summary (Last 24 hours)  at 12/04/2021 0932 Last data filed at 12/03/2021 2319 Gross per 24 hour  Intake 2095 ml  Output --  Net 2095 ml   Filed Weights   12/03/21 1738  Weight: 61.2 kg    Examination:  General exam: Appears calm and comfortable  Respiratory system: Clear to auscultation. Respiratory effort normal. Cardiovascular system: S1 & S2 heard, RRR. No JVD, murmurs, rubs, gallops or clicks. No pedal edema. Gastrointestinal system: Abdomen is nondistended, soft and nontender. No organomegaly or masses felt.  Normal bowel sounds heard. Central nervous system: Alert and oriented. No focal neurological deficits. Extremities: Symmetric 5 x 5 power. Skin: No rashes, lesions or ulcers Psychiatry: Judgement and insight appear normal. Mood & affect appropriate.     Data Reviewed: I have personally reviewed following labs and imaging studies  CBC: Recent Labs  Lab 12/03/21 1742 12/04/21 0411  WBC 13.5* 11.6*  NEUTROABS 8.2*  --   HGB 12.5 11.0*  HCT 40.4 35.8*  MCV 93.3 94.0  PLT 358 824   Basic Metabolic Panel: Recent Labs  Lab 12/03/21 1742 12/04/21 0411  NA 140 143  K 2.9* 4.0  CL 106 118*  CO2 25 21*  GLUCOSE 84 89  BUN 8 7  CREATININE 1.07* 1.05*  CALCIUM 9.0 8.1*  MG 2.1  --    GFR: Estimated Creatinine Clearance: 59 mL/min (A) (by C-G formula based on SCr of 1.05 mg/dL (H)). Liver Function Tests: Recent Labs  Lab 12/03/21 1742  AST 12*  ALT 11  ALKPHOS 87  BILITOT 0.5  PROT 7.3  ALBUMIN 3.8   Recent Labs  Lab 12/03/21 1742  LIPASE 24   No results for input(s): "AMMONIA" in the last 168 hours. Coagulation Profile: Recent Labs  Lab 12/03/21 1742  INR 1.0   Cardiac Enzymes: No results for input(s): "CKTOTAL", "CKMB", "CKMBINDEX", "TROPONINI" in the last 168 hours. BNP (last 3 results) No results for input(s): "PROBNP" in the last 8760 hours. HbA1C: No results for input(s): "HGBA1C" in the last 72 hours. CBG: No results for input(s): "GLUCAP" in the last 168 hours. Lipid Profile: No results for input(s): "CHOL", "HDL", "LDLCALC", "TRIG", "CHOLHDL", "LDLDIRECT" in the last 72 hours. Thyroid Function Tests: No results for input(s): "TSH", "T4TOTAL", "FREET4", "T3FREE", "THYROIDAB" in the last 72 hours. Anemia Panel: No results for input(s): "VITAMINB12", "FOLATE", "FERRITIN", "TIBC", "IRON", "RETICCTPCT" in the last 72 hours. Urine analysis:    Component Value Date/Time   COLORURINE STRAW (A) 12/04/2021 0031   APPEARANCEUR CLEAR (A) 12/04/2021 0031    APPEARANCEUR Hazy 06/28/2013 2238   LABSPEC 1.016 12/04/2021 0031   LABSPEC 1.005 06/28/2013 2238   PHURINE 6.0 12/04/2021 0031   GLUCOSEU NEGATIVE 12/04/2021 0031   GLUCOSEU Negative 06/28/2013 2238   HGBUR NEGATIVE 12/04/2021 0031   BILIRUBINUR NEGATIVE 12/04/2021 0031   BILIRUBINUR Negative 06/28/2013 2238   KETONESUR NEGATIVE 12/04/2021 0031   PROTEINUR NEGATIVE 12/04/2021 0031   UROBILINOGEN 0.2 09/25/2014 1513   NITRITE NEGATIVE 12/04/2021 0031   LEUKOCYTESUR NEGATIVE 12/04/2021 0031   LEUKOCYTESUR Negative 06/28/2013 2238   Sepsis Labs: '@LABRCNTIP'$ (procalcitonin:4,lacticidven:4)  )No results found for this or any previous visit (from the past 240 hour(s)).       Radiology Studies: CT Angio Chest/Abd/Pel for Dissection W and/or Wo Contrast  Result Date: 12/03/2021 CLINICAL DATA:  Chest pain, back pain EXAM: CT ANGIOGRAPHY CHEST, ABDOMEN AND PELVIS TECHNIQUE: Non-contrast CT of the chest was initially obtained. Multidetector CT imaging through the chest, abdomen and pelvis was performed using the standard  protocol during bolus administration of intravenous contrast. Multiplanar reconstructed images and MIPs were obtained and reviewed to evaluate the vascular anatomy. RADIATION DOSE REDUCTION: This exam was performed according to the departmental dose-optimization program which includes automated exposure control, adjustment of the mA and/or kV according to patient size and/or use of iterative reconstruction technique. CONTRAST:  164m OMNIPAQUE IOHEXOL 350 MG/ML SOLN COMPARISON:  Previous studies including chest radiograph done earlier today, CT abdomen done on 01/19/2018 FINDINGS: CTA CHEST FINDINGS Cardiovascular: There is no evidence of mural hematoma in thoracic aorta in the noncontrast images of chest. There is homogeneous enhancement in thoracic aorta. There is no demonstrable intimal flap in thoracic aorta. There are no filling defects in central pulmonary artery branches.  Mediastinum/Nodes: There is no evidence of mediastinal hematoma. There is no significant lymphadenopathy. Lungs/Pleura: There is no focal pulmonary consolidation. There is no pleural effusion or pneumothorax. Musculoskeletal: No significant abnormalities are seen in bony structures in chest. Review of the MIP images confirms the above findings. CTA ABDOMEN AND PELVIS FINDINGS VASCULAR Aorta: Atherosclerotic plaques and calcifications are seen in infrarenal aorta close to bifurcation with some narrowing of the lumen. There is no focal aneurysmal dilation. There is no demonstrable intimal flap. Celiac: Unremarkable. SMA: Unremarkable. Renals: Unremarkable. IMA: Patent. There is possible significant stenosis at the origin of IMA. Inflow: There is no significant narrowing of the iliacs. Scattered atherosclerotic plaques and calcifications are seen. Veins: Unremarkable. Review of the MIP images confirms the above findings. NON-VASCULAR Hepatobiliary: Liver measures 19.2 cm in length. There is fatty infiltration. Surgical clips are seen in gallbladder fossa. There is no significant dilation of bile ducts. Pancreas: No focal abnormalities are seen. Spleen: There are calcified granulomas in spleen. Adrenals/Urinary Tract: Adrenals are unremarkable. There is no hydronephrosis. There are no renal or ureteral stones. Urinary bladder is unremarkable. Stomach/Bowel: Stomach is unremarkable. Small bowel loops are unremarkable. Appendix is not seen. There is no pericecal inflammation. There is fluid in the lumen of distal small bowel loops and colon. There is no significant wall thickening in colon. There is no pericolic stranding. Lymphatic: No significant lymphadenopathy seen. Reproductive: Uterus is to the left of midline. There are no adnexal masses. There is no free fluid in pelvis. Other: There is no ascites or pneumoperitoneum. Small umbilical hernia containing fat is seen. Musculoskeletal: Degenerative changes are noted in  lower lumbar spine at the L4-L5 level with significant encroachment of neural foramina. There is minimal decrease in height of upper endplate of body of T3 vertebra with Schmorl's node, possibly residual from previous injury. Review of the MIP images confirms the above findings. IMPRESSION: There is no evidence of dissection in the thoracic and abdominal aorta. Major branches of thoracic and abdominal aorta are patent. Moderate to marked atherosclerotic plaques and calcifications are noted in the infrarenal abdominal aorta with narrowing of lumen. There is no focal aneurysmal dilation. 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 There is no evidence of central pulmonary artery embolism. There is no focal pulmonary consolidation. There is no evidence of intestinal obstruction or pneumoperitoneum. There is no hydronephrosis. Enlarged fatty liver. There is fluid in the lumen of distal small bowel loops and colon which may be a normal variation or suggest nonspecific enteritis. There is no significant bowel wall thickening. Other findings as described in the body of the report. Electronically Signed   By: PElmer PickerM.D.   On: 12/03/2021 20:07   CT Head Wo Contrast  Result Date: 12/03/2021 CLINICAL  DATA:  Sudden severe headache EXAM: CT HEAD WITHOUT CONTRAST TECHNIQUE: Contiguous axial images were obtained from the base of the skull through the vertex without intravenous contrast. RADIATION DOSE REDUCTION: This exam was performed according to the departmental dose-optimization program which includes automated exposure control, adjustment of the mA and/or kV according to patient size and/or use of iterative reconstruction technique. COMPARISON:  MR head 07/21/2021 and CT head 07/21/2021 FINDINGS: Brain: No intracranial hemorrhage, mass effect, or evidence of acute infarct. No hydrocephalus. No extra-axial fluid collection. Chronic infarcts left medial parietal lobe, and bilateral cerebellar  hemispheres. Vascular: No hyperdense vessel or unexpected calcification. Skull: No fracture or focal lesion. Sinuses/Orbits: No acute finding. Paranasal sinuses and mastoid air cells are well aerated. Other: None. IMPRESSION: No acute intracranial abnormality. Chronic cerebellar and left parietal infarcts. Electronically Signed   By: Placido Sou M.D.   On: 12/03/2021 19:55   DG Chest Port 1 View  Result Date: 12/03/2021 CLINICAL DATA:  Hypotension and tachycardia EXAM: PORTABLE CHEST 1 VIEW COMPARISON:  10/05/2021 FINDINGS: Numerous leads and wires project over the chest. Midline trachea. Normal heart size and mediastinal contours. No pleural effusion or pneumothorax. Clear lungs. IMPRESSION: Normal chest. Electronically Signed   By: Abigail Miyamoto M.D.   On: 12/03/2021 18:31        Scheduled Meds:  atorvastatin  80 mg Oral Daily   clopidogrel  75 mg Oral Daily   DULoxetine  30 mg Oral Daily   DULoxetine  60 mg Oral Daily   enoxaparin (LOVENOX) injection  40 mg Subcutaneous Q24H   famotidine  20 mg Oral Daily   ferrous sulfate  325 mg Oral BID   levETIRAcetam  750 mg Oral BID   midodrine  10 mg Oral TID WC   pantoprazole  40 mg Oral Daily   Continuous Infusions:  sodium chloride 150 mL/hr at 12/04/21 0413   promethazine (PHENERGAN) injection (IM or IVPB)       LOS: 0 days     Desma Maxim, MD [facility]   If 7PM-7AM, please contact night-coverage [email or web address] Password [facility] 12/04/2021, 9:32 AM

## 2021-12-04 NOTE — Evaluation (Signed)
Physical Therapy Evaluation Patient Details Name: MARIELIS SAMARA MRN: [id number] DOB: 04-Mar-1976 Today's Date: 12/04/2021  History of Present Illness  Pt is a 46 y/o F admitted on 12/03/21 after presenting to the ED with c/o acute onset midsternal chest pain with nausea but without vomiting. Troponins negative. Pt is being treated for hypotension of unclear etiology. PMH: asthma, bipolar disorder, chronic back pain, depression, fibromyalgia, GERD, DM2, tobacco abuse, peripheral neuropathy, seizure disorder, COPD, anxiety, CVA  Clinical Impression  Pt seen for PT evaluation with pt agreeable to tx. Pt reports prior to admission she was independent without AD, would occasionally use SPC PRN 2/2 HA, but was active in the community (played pool). Pt does endorse 2 falls on stairs in recent past. On this date, pt is able to complete bed mobility, transfers, & gait without AD independently & negotiates stairs with 1 rail with supervision. PT educated pt on compensatory pattern for increased safety. At this time, pt does not demonstrate any acute PT needs & PT to sign off. Please re-consult if new needs arise.  BP checked in LUE: Supine: 85/65 mmHg (MAP 73), HR 91 bpm Sitting: 93/79 mmHg (MAP 86), HR 96 bpm Standing at 0: 90/70 mmHg (MAP 77), HR 105 bpm Standing at 3: 103/79 mmHg (MAP 88), HR 108 bpm Sitting EOB at end of session: 101/82 mmHg (MAP 89), HR 101 bpm Also discussed standing & pausing before ambulating to allow BP to adjust as needed.      Recommendations for follow up therapy are one component of a multi-disciplinary discharge planning process, led by the attending physician.  Recommendations may be updated based on patient status, additional functional criteria and insurance authorization.  Follow Up Recommendations No PT follow up      Assistance Recommended at Discharge None  Patient can return home with the following       Equipment Recommendations None recommended by PT   Recommendations for Other Services       Functional Status Assessment Patient has had a recent decline in their functional status and demonstrates the ability to make significant improvements in function in a reasonable and predictable amount of time.     Precautions / Restrictions Precautions Precautions: None Restrictions Weight Bearing Restrictions: No      Mobility  Bed Mobility Overal bed mobility: Independent             General bed mobility comments: supine>sit independently    Transfers Overall transfer level: Independent Equipment used: None               General transfer comment: STS from EOB without AD    Ambulation/Gait Ambulation/Gait assistance: Independent Gait Distance (Feet): 200 Feet Assistive device: None Gait Pattern/deviations: WFL(Within Functional Limits) Gait velocity: WNL        Stairs Stairs: Yes Stairs assistance: Supervision Stair Management: One rail Right, Alternating pattern Number of Stairs: 7 General stair comments: educated pt on step to pattern with compensatory pattern 2/2 pt reporting hx of LLE issues following having 2 titanium rods put in last summer  Wheelchair Mobility    Modified Rankin (Stroke Patients Only)       Balance Overall balance assessment: Mild deficits observed, not formally tested (no overt LOB noted but pt endorses 2 falls in recent past but unsure of exact cause)  Pertinent Vitals/Pain Pain Assessment Pain Assessment: Faces Faces Pain Scale: Hurts a little bit Pain Location: L shoulder pain (reports this has been occurring for a few months 2/2 falling but notes x-ray was negative) Pain Descriptors / Indicators: Discomfort Pain Intervention(s): Monitored during session    Home Living Family/patient expects to be discharged to:: Private residence Living Arrangements:  (daughter & mother) Available Help at Discharge:  Family;Available 24 hours/day Type of Home: Mobile home Home Access: Stairs to enter Entrance Stairs-Rails: Right;Left;Can reach both Entrance Stairs-Number of Steps: 2 at front, 6 at back   Home Layout: One level Home Equipment: Conservation officer, nature (2 wheels);Cane - single point      Prior Function               Mobility Comments: Pt reports she's independent without AD but will occasionally use SPC PRN if not feeling well 2/2 HA. Doesn't drive but does go out in the community with her mother, goes to play pool at local spot, endorses 2 falls on the stairs in the recent past but attributes this to possibly the heat? ADLs Comments: Independent     Hand Dominance        Extremity/Trunk Assessment   Upper Extremity Assessment Upper Extremity Assessment: Overall WFL for tasks assessed    Lower Extremity Assessment Lower Extremity Assessment: Overall WFL for tasks assessed       Communication   Communication: No difficulties  Cognition Arousal/Alertness: Awake/alert Behavior During Therapy: WFL for tasks assessed/performed Overall Cognitive Status: Within Functional Limits for tasks assessed                                          General Comments      Exercises     Assessment/Plan    PT Assessment Patient does not need any further PT services  PT Problem List         PT Treatment Interventions      PT Goals (Current goals can be found in the Care Plan section)  Acute Rehab PT Goals Patient Stated Goal: none stated PT Goal Formulation: With patient Time For Goal Achievement: 12/18/21 Potential to Achieve Goals: Good    Frequency       Co-evaluation               AM-PAC PT "6 Clicks" Mobility  Outcome Measure Help needed turning from your back to your side while in a flat bed without using bedrails?: None Help needed moving from lying on your back to sitting on the side of a flat bed without using bedrails?: None Help needed  moving to and from a bed to a chair (including a wheelchair)?: None Help needed standing up from a chair using your arms (e.g., wheelchair or bedside chair)?: None Help needed to walk in hospital room?: None Help needed climbing 3-5 steps with a railing? : None 6 Click Score: 24    End of Session   Activity Tolerance: Patient tolerated treatment well Patient left:  (exiting bathroom, walking towards bed) Nurse Communication: Mobility status      Time: 7846-9629 PT Time Calculation (min) (ACUTE ONLY): 14 min   Charges:   PT Evaluation $PT Eval Low Complexity: 1 Low          Lavone Nian, PT, DPT 12/04/21, 3:28 PM   Waunita Schooner 12/04/2021, 3:25 PM

## 2021-12-04 NOTE — ED Notes (Signed)
Informed RN bed assigned 

## 2021-12-05 LAB — GASTROINTESTINAL PANEL BY PCR, STOOL (REPLACES STOOL CULTURE)

## 2021-12-05 LAB — BASIC METABOLIC PANEL
Anion gap: 5 (ref 5–15)
Anion gap: 6 (ref 5–15)
BUN: 7 mg/dL (ref 6–20)
BUN: 7 mg/dL (ref 6–20)
CO2: 25 mmol/L (ref 22–32)
CO2: 24 mmol/L (ref 22–32)
Calcium: 8.8 mg/dL — ABNORMAL LOW (ref 8.9–10.3)
Calcium: 9.2 mg/dL (ref 8.9–10.3)
Chloride: 112 mmol/L — ABNORMAL HIGH (ref 98–111)
Chloride: 110 mmol/L (ref 98–111)
Creatinine, Ser: 0.79 mg/dL (ref 0.44–1.00)
Creatinine, Ser: 0.83 mg/dL (ref 0.44–1.00)
GFR, Estimated: >60 mL/min (ref >60)
GFR, Estimated: >60 mL/min (ref >60)
Glucose, Bld: 103 mg/dL — ABNORMAL HIGH (ref 70–99)
Glucose, Bld: 114 mg/dL — ABNORMAL HIGH (ref 70–99)
Potassium: 2.7 mmol/L — CL (ref 3.5–5.1)
Potassium: 4 mmol/L (ref 3.5–5.1)
Sodium: 142 mmol/L (ref 135–145)
Sodium: 140 mmol/L (ref 135–145)

## 2021-12-05 LAB — CBC
HCT: 35.4 % — ABNORMAL LOW (ref 36.0–46.0)
Hemoglobin: 11.4 g/dL — ABNORMAL LOW (ref 12.0–15.0)
MCH: 28.9 pg (ref 26.0–34.0)
MCHC: 32.2 g/dL (ref 30.0–36.0)
MCV: 89.6 fL (ref 80.0–100.0)
Platelets: 315 10*3/uL (ref 150–400)
RBC: 3.95 MIL/uL (ref 3.87–5.11)
RDW: 15.4 % (ref 11.5–15.5)
WBC: 10.4 10*3/uL (ref 4.0–10.5)
nRBC: 0 % (ref 0.0–0.2)

## 2021-12-05 LAB — C DIFFICILE QUICK SCREEN W PCR REFLEX
C Diff antigen: POSITIVE — AB
C Diff toxin: NEGATIVE

## 2021-12-05 LAB — GLUCOSE, CAPILLARY
Glucose-Capillary: 87 mg/dL (ref 70–99)
Glucose-Capillary: 134 mg/dL — ABNORMAL HIGH (ref 70–99)
Glucose-Capillary: 128 mg/dL — ABNORMAL HIGH (ref 70–99)
Glucose-Capillary: 93 mg/dL (ref 70–99)

## 2021-12-05 LAB — CLOSTRIDIUM DIFFICILE BY PCR, REFLEXED: Toxigenic C. Difficile by PCR: POSITIVE — AB

## 2021-12-05 LAB — MAGNESIUM: Magnesium: 2 mg/dL (ref 1.7–2.4)

## 2021-12-05 MED ORDER — POTASSIUM CHLORIDE CRYS ER 20 MEQ PO TBCR
40.0000 meq | EXTENDED_RELEASE_TABLET | Freq: Once | ORAL | Status: AC
  Administered 2021-12-05: 40 meq via ORAL
  Filled 2021-12-05: qty 2

## 2021-12-05 MED ORDER — POTASSIUM CHLORIDE 10 MEQ/100ML IV SOLN
10.0000 meq | INTRAVENOUS | Status: AC
  Administered 2021-12-05 (×4): 10 meq via INTRAVENOUS
  Filled 2021-12-05 (×3): qty 100

## 2021-12-05 MED ORDER — POTASSIUM CHLORIDE CRYS ER 20 MEQ PO TBCR
60.0000 meq | EXTENDED_RELEASE_TABLET | ORAL | Status: AC
  Administered 2021-12-05: 60 meq via ORAL
  Filled 2021-12-05: qty 3

## 2021-12-05 NOTE — Plan of Care (Signed)

## 2021-12-05 NOTE — Progress Notes (Addendum)
PROGRESS NOTE    Lauren Cross  BLT:[id number] DOB: 1975-05-31 DOA: 12/03/2021 PCP: [name], MD     Brief Narrative:  From admission h and p Lauren Cross is a 46 y.o. Caucasian female with medical history significant for asthma, bipolar disorder, chronic back pain, depression, fibromyalgia, GERD, type 2 diabetes mellitus, ongoing tobacco abuse, peripheral neuropathy, seizure disorder, COPD and anxiety, who presented to the ER with acute onset of midsternal chest pain felt as pressure and graded 7/10 in severity with associated nausea without vomiting or diaphoresis.  She denied any cough or wheezing or hemoptysis.  No leg pain or edema or recent travels or surgeries.  She gets iron transfusion for anemia and her last transfusion was on 8/14.  She was seen by her neurologist today when she developed hypotension and tachycardia with associated chest pain.  She admitted to radiation to her left arm and it lasted about 45 minutes prior to arrival to the ED.  She was also complaining of lightheadedness and dizziness with associated headache.  No paresthesias or focal muscle weakness.  No dysuria, oliguria or hematuria or flank pain.  No other bleeding diathesis.   Assessment & Plan:   Principal Problem:   Hypotension Active Problems:   Chest pain   Chronic hyponatremia   HTN (hypertension)   Seizure disorder (HCC)   Current smoker   Bipolar disorder ([place])   Cirrhosis of liver (HCC)   History of CVA (cerebrovascular accident)   Tobacco abuse   Dyslipidemia   Anxiety and depression   Type 2 diabetes mellitus with peripheral neuropathy (HCC)   GERD without esophagitis  # Hypotension Etiology unclear. Though on multiple meds that can contribute, though denies misuse. Did complain of chest pain though this is resolved and troponins negative. No sig rhythm disturbance on ekg. No vomiting or diarrhea or reduced po to suggest volume loss (though did develop diarrhea as below after  presenting to ED). Hypotension has resolved with fluid resuscitation. Orthostats negative. Duloxetine is relatively new and that particularly in combo with cariprazine could contribute. Patient feels well today, ambulated with PT/OT 8/18 without problem. Blood cultures ngtd.  # Diarrhea Several watery BMs per patient beginning yesterday, no abd pain or fever or leukocytosis or fever - check c diff, gi pathogen panel  # Hypokalemia Likely 2/2 diarrhea. Mg wnl - replete, monitor  # Chest pain Resolved. No hx of CAD. Serial troponins and ekg negative - monitor tele  # HTN Not on meds at home, here hypotensive  # History CVA Nothing acute seen on CT - cont home atorva, plavix  # Bipolar - cont home vraylar, ativan  # Chronic pain - home gabapentin, duloxetine (dose reduced to 30 given possible contribution to hypotension), norco, tramadol  # Iron deficiency No sig anemia - home iron  # Seizure disorder No report of recent seizure-like activity - home keppra  # T2DM Glucose appropriate - home metformin      DVT prophylaxis: lovenox Code Status: full Family Communication: none at bedside, no answer when mother called  Level of care: Telemetry Medical Status is: Observation The patient will require care spanning > 2 midnights and should be moved to inpatient because: need for inpatient monitoring    Consultants:  none  Procedures: none  Antimicrobials:  none    Subjective: This morning chest pain resolved, feeling well, no abd pain or fever, but several watery BMs overnight, no blood  Objective: Vitals:   12/04/21 1614 12/04/21 1956  12/05/21 0326 12/05/21 0800  BP: (!) 144/93 (!) 138/92 107/81 110/79  Pulse: 87 88 86 88  Resp: '18 18 18 18  '$ Temp: 98.4 F (36.9 C) 98.5 F (36.9 C) (!) 97.5 F (36.4 C) 98 F (36.7 C)  TempSrc: Oral   Oral  SpO2: 100% 99% 100%   Weight:      Height:        Intake/Output Summary (Last 24 hours) at 12/05/2021  1200 Last data filed at 12/04/2021 1700 Gross per 24 hour  Intake 240 ml  Output --  Net 240 ml   Filed Weights   12/03/21 1738  Weight: 61.2 kg    Examination:  General exam: Appears calm and comfortable  Respiratory system: Clear to auscultation. Respiratory effort normal. Cardiovascular system: S1 & S2 heard, RRR. No JVD, murmurs, rubs, gallops or clicks. No pedal edema. Gastrointestinal system: Abdomen is nondistended, soft and nontender. No organomegaly or masses felt. Normal bowel sounds heard. Central nervous system: Alert and oriented. No focal neurological deficits. Extremities: Symmetric 5 x 5 power. Skin: No rashes, lesions or ulcers Psychiatry: Judgement and insight appear normal. Mood & affect appropriate.     Data Reviewed: I have personally reviewed following labs and imaging studies  CBC: Recent Labs  Lab 12/03/21 1742 12/04/21 0411 12/05/21 0345  WBC 13.5* 11.6* 10.4  NEUTROABS 8.2*  --   --   HGB 12.5 11.0* 11.4*  HCT 40.4 35.8* 35.4*  MCV 93.3 94.0 89.6  PLT 358 312 956   Basic Metabolic Panel: Recent Labs  Lab 12/03/21 1742 12/04/21 0411 12/05/21 0345  NA 140 143 142  K 2.9* 4.0 2.7*  CL 106 118* 112*  CO2 25 21* 25  GLUCOSE 84 89 103*  BUN '8 7 7  '$ CREATININE 1.07* 1.05* 0.79  CALCIUM 9.0 8.1* 8.8*  MG 2.1  --  2.0   GFR: Estimated Creatinine Clearance: 77.4 mL/min (by C-G formula based on SCr of 0.79 mg/dL). Liver Function Tests: Recent Labs  Lab 12/03/21 1742  AST 12*  ALT 11  ALKPHOS 87  BILITOT 0.5  PROT 7.3  ALBUMIN 3.8   Recent Labs  Lab 12/03/21 1742  LIPASE 24   No results for input(s): "AMMONIA" in the last 168 hours. Coagulation Profile: Recent Labs  Lab 12/03/21 1742  INR 1.0   Cardiac Enzymes: No results for input(s): "CKTOTAL", "CKMB", "CKMBINDEX", "TROPONINI" in the last 168 hours. BNP (last 3 results) No results for input(s): "PROBNP" in the last 8760 hours. HbA1C: No results for input(s): "HGBA1C"  in the last 72 hours. CBG: Recent Labs  Lab 12/04/21 1700 12/04/21 2003 12/05/21 0759 12/05/21 1127  GLUCAP 113* 107* 87 134*   Lipid Profile: No results for input(s): "CHOL", "HDL", "LDLCALC", "TRIG", "CHOLHDL", "LDLDIRECT" in the last 72 hours. Thyroid Function Tests: No results for input(s): "TSH", "T4TOTAL", "FREET4", "T3FREE", "THYROIDAB" in the last 72 hours. Anemia Panel: No results for input(s): "VITAMINB12", "FOLATE", "FERRITIN", "TIBC", "IRON", "RETICCTPCT" in the last 72 hours. Urine analysis:    Component Value Date/Time   COLORURINE STRAW (A) 12/04/2021 0031   APPEARANCEUR CLEAR (A) 12/04/2021 0031   APPEARANCEUR Hazy 06/28/2013 2238   LABSPEC 1.016 12/04/2021 0031   LABSPEC 1.005 06/28/2013 2238   PHURINE 6.0 12/04/2021 0031   GLUCOSEU NEGATIVE 12/04/2021 0031   GLUCOSEU Negative 06/28/2013 2238   HGBUR NEGATIVE 12/04/2021 0031   BILIRUBINUR NEGATIVE 12/04/2021 0031   BILIRUBINUR Negative 06/28/2013 2238   KETONESUR NEGATIVE 12/04/2021 0031   PROTEINUR NEGATIVE  12/04/2021 0031   UROBILINOGEN 0.2 09/25/2014 1513   NITRITE NEGATIVE 12/04/2021 0031   LEUKOCYTESUR NEGATIVE 12/04/2021 0031   LEUKOCYTESUR Negative 06/28/2013 2238   Sepsis Labs: '@LABRCNTIP'$ (procalcitonin:4,lacticidven:4)  ) Recent Results (from the past 240 hour(s))  Culture, blood (Routine X 2) w Reflex to ID Panel     Status: None (Preliminary result)   Collection Time: 12/04/21  7:20 PM   Specimen: BLOOD RIGHT HAND  Result Value Ref Range Status   Specimen Description BLOOD RIGHT HAND  Final   Special Requests   Final    BOTTLES DRAWN AEROBIC AND ANAEROBIC Blood Culture adequate volume   Culture   Final    NO GROWTH < 12 HOURS Performed at [facility], [address] [place], [place] [address]    Report Status PENDING  Incomplete  Culture, blood (Routine X 2) w Reflex to ID Panel     Status: None (Preliminary result)   Collection Time: 12/04/21  7:32 PM   Specimen: BLOOD   Result Value Ref Range Status   Specimen Description BLOOD LEFT WRIST  Final   Special Requests   Final    BOTTLES DRAWN AEROBIC AND ANAEROBIC Blood Culture adequate volume   Culture   Final    NO GROWTH < 12 HOURS Performed at [facility], [address] [place], [place] [address]    Report Status PENDING  Incomplete         Radiology Studies: CT Angio Chest/Abd/Pel for Dissection W and/or Wo Contrast  Result Date: 12/03/2021 CLINICAL DATA:  Chest pain, back pain EXAM: CT ANGIOGRAPHY CHEST, ABDOMEN AND PELVIS TECHNIQUE: Non-contrast CT of the chest was initially obtained. Multidetector CT imaging through the chest, abdomen and pelvis was performed using the standard protocol during bolus administration of intravenous contrast. Multiplanar reconstructed images and MIPs were obtained and reviewed to evaluate the vascular anatomy. RADIATION DOSE REDUCTION: This exam was performed according to the departmental dose-optimization program which includes automated exposure control, adjustment of the mA and/or kV according to patient size and/or use of iterative reconstruction technique. CONTRAST:  141m OMNIPAQUE IOHEXOL 350 MG/ML SOLN COMPARISON:  Previous studies including chest radiograph done earlier today, CT abdomen done on 01/19/2018 FINDINGS: CTA CHEST FINDINGS Cardiovascular: There is no evidence of mural hematoma in thoracic aorta in the noncontrast images of chest. There is homogeneous enhancement in thoracic aorta. There is no demonstrable intimal flap in thoracic aorta. There are no filling defects in central pulmonary artery branches. Mediastinum/Nodes: There is no evidence of mediastinal hematoma. There is no significant lymphadenopathy. Lungs/Pleura: There is no focal pulmonary consolidation. There is no pleural effusion or pneumothorax. Musculoskeletal: No significant abnormalities are seen in bony structures in chest. Review of the MIP images confirms the above findings.  CTA ABDOMEN AND PELVIS FINDINGS VASCULAR Aorta: Atherosclerotic plaques and calcifications are seen in infrarenal aorta close to bifurcation with some narrowing of the lumen. There is no focal aneurysmal dilation. There is no demonstrable intimal flap. Celiac: Unremarkable. SMA: Unremarkable. Renals: Unremarkable. IMA: Patent. There is possible significant stenosis at the origin of IMA. Inflow: There is no significant narrowing of the iliacs. Scattered atherosclerotic plaques and calcifications are seen. Veins: Unremarkable. Review of the MIP images confirms the above findings. NON-VASCULAR Hepatobiliary: Liver measures 19.2 cm in length. There is fatty infiltration. Surgical clips are seen in gallbladder fossa. There is no significant dilation of bile ducts. Pancreas: No focal abnormalities are seen. Spleen: There are calcified granulomas in spleen. Adrenals/Urinary Tract: Adrenals are  unremarkable. There is no hydronephrosis. There are no renal or ureteral stones. Urinary bladder is unremarkable. Stomach/Bowel: Stomach is unremarkable. Small bowel loops are unremarkable. Appendix is not seen. There is no pericecal inflammation. There is fluid in the lumen of distal small bowel loops and colon. There is no significant wall thickening in colon. There is no pericolic stranding. Lymphatic: No significant lymphadenopathy seen. Reproductive: Uterus is to the left of midline. There are no adnexal masses. There is no free fluid in pelvis. Other: There is no ascites or pneumoperitoneum. Small umbilical hernia containing fat is seen. Musculoskeletal: Degenerative changes are noted in lower lumbar spine at the L4-L5 level with significant encroachment of neural foramina. There is minimal decrease in height of upper endplate of body of T3 vertebra with Schmorl's node, possibly residual from previous injury. Review of the MIP images confirms the above findings. IMPRESSION: There is no evidence of dissection in the thoracic and  abdominal aorta. Major branches of thoracic and abdominal aorta are patent. Moderate to marked atherosclerotic plaques and calcifications are noted in the infrarenal abdominal aorta with narrowing of lumen. There is no focal aneurysmal dilation. 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 There is no evidence of central pulmonary artery embolism. There is no focal pulmonary consolidation. There is no evidence of intestinal obstruction or pneumoperitoneum. There is no hydronephrosis. Enlarged fatty liver. There is fluid in the lumen of distal small bowel loops and colon which may be a normal variation or suggest nonspecific enteritis. There is no significant bowel wall thickening. Other findings as described in the body of the report. Electronically Signed   By: Elmer Picker M.D.   On: 12/03/2021 20:07   CT Head Wo Contrast  Result Date: 12/03/2021 CLINICAL DATA:  Sudden severe headache EXAM: CT HEAD WITHOUT CONTRAST TECHNIQUE: Contiguous axial images were obtained from the base of the skull through the vertex without intravenous contrast. RADIATION DOSE REDUCTION: This exam was performed according to the departmental dose-optimization program which includes automated exposure control, adjustment of the mA and/or kV according to patient size and/or use of iterative reconstruction technique. COMPARISON:  MR head 07/21/2021 and CT head 07/21/2021 FINDINGS: Brain: No intracranial hemorrhage, mass effect, or evidence of acute infarct. No hydrocephalus. No extra-axial fluid collection. Chronic infarcts left medial parietal lobe, and bilateral cerebellar hemispheres. Vascular: No hyperdense vessel or unexpected calcification. Skull: No fracture or focal lesion. Sinuses/Orbits: No acute finding. Paranasal sinuses and mastoid air cells are well aerated. Other: None. IMPRESSION: No acute intracranial abnormality. Chronic cerebellar and left parietal infarcts. Electronically Signed   By: Placido Sou M.D.    On: 12/03/2021 19:55   DG Chest Port 1 View  Result Date: 12/03/2021 CLINICAL DATA:  Hypotension and tachycardia EXAM: PORTABLE CHEST 1 VIEW COMPARISON:  10/05/2021 FINDINGS: Numerous leads and wires project over the chest. Midline trachea. Normal heart size and mediastinal contours. No pleural effusion or pneumothorax. Clear lungs. IMPRESSION: Normal chest. Electronically Signed   By: Abigail Miyamoto M.D.   On: 12/03/2021 18:31        Scheduled Meds:  atorvastatin  80 mg Oral Daily   cariprazine  3 mg Oral Daily   clopidogrel  75 mg Oral Daily   DULoxetine  30 mg Oral Daily   enoxaparin (LOVENOX) injection  40 mg Subcutaneous Q24H   famotidine  20 mg Oral Daily   ferrous sulfate  325 mg Oral BID   gabapentin  600 mg Oral TID   insulin aspart  0-5 Units Subcutaneous QHS   insulin aspart  0-9 Units Subcutaneous TID WC   levETIRAcetam  750 mg Oral BID   [START ON 12/06/2021] metFORMIN  1,000 mg Oral Q supper   pantoprazole  40 mg Oral Daily   potassium chloride  60 mEq Oral STAT   Continuous Infusions:  promethazine (PHENERGAN) injection (IM or IVPB)       LOS: 1 day     Desma Maxim, MD [facility]   If 7PM-7AM, please contact night-coverage [email or web address] Password [email or web address] 12/05/2021, 12:00 PM

## 2021-12-05 NOTE — Plan of Care (Signed)
  Problem: Education: Goal: Knowledge of General Education information will improve Description: Including pain rating scale, medication(s)/side effects and non-pharmacologic comfort measures Outcome: Progressing   Problem: Health Behavior/Discharge Planning: Goal: Ability to manage health-related needs will improve Outcome: Progressing   Problem: Clinical Measurements: Goal: Will remain free from infection Outcome: Progressing Goal: Cardiovascular complication will be avoided Outcome: Progressing   Problem: Activity: Goal: Risk for activity intolerance will decrease Outcome: Progressing   Problem: Nutrition: Goal: Adequate nutrition will be maintained Outcome: Progressing   Problem: Coping: Goal: Level of anxiety will decrease Outcome: Progressing   Problem: Pain Managment: Goal: General experience of comfort will improve Outcome: Progressing   Problem: Safety: Goal: Ability to remain free from injury will improve Outcome: Progressing

## 2021-12-06 DIAGNOSIS — A0472 Enterocolitis due to Clostridium difficile, not specified as recurrent: Secondary | ICD-10-CM

## 2021-12-06 LAB — CBC
HCT: 38.4 % (ref 36.0–46.0)
Hemoglobin: 12.7 g/dL (ref 12.0–15.0)
MCH: 29.3 pg (ref 26.0–34.0)
MCHC: 33.1 g/dL (ref 30.0–36.0)
MCV: 88.7 fL (ref 80.0–100.0)
Platelets: 326 10*3/uL (ref 150–400)
RBC: 4.33 MIL/uL (ref 3.87–5.11)
RDW: 15.8 % — ABNORMAL HIGH (ref 11.5–15.5)
WBC: 10.5 10*3/uL (ref 4.0–10.5)
nRBC: 0 % (ref 0.0–0.2)

## 2021-12-06 LAB — BASIC METABOLIC PANEL
Anion gap: 6 (ref 5–15)
BUN: 6 mg/dL (ref 6–20)
CO2: 25 mmol/L (ref 22–32)
Calcium: 8.9 mg/dL (ref 8.9–10.3)
Chloride: 109 mmol/L (ref 98–111)
Creatinine, Ser: 0.78 mg/dL (ref 0.44–1.00)
GFR, Estimated: >60 mL/min (ref >60)
Glucose, Bld: 96 mg/dL (ref 70–99)
Potassium: 3.4 mmol/L — ABNORMAL LOW (ref 3.5–5.1)
Sodium: 140 mmol/L (ref 135–145)

## 2021-12-06 LAB — GLUCOSE, CAPILLARY
Glucose-Capillary: 102 mg/dL — ABNORMAL HIGH (ref 70–99)
Glucose-Capillary: 145 mg/dL — ABNORMAL HIGH (ref 70–99)

## 2021-12-06 MED ORDER — VANCOMYCIN HCL 125 MG PO CAPS
125.0000 mg | ORAL_CAPSULE | Freq: Four times a day (QID) | ORAL | Status: DC
  Filled 2021-12-06 (×6): qty 1

## 2021-12-06 MED ORDER — VANCOMYCIN HCL 125 MG PO CAPS: 125.0000 mg | ORAL_CAPSULE | Freq: Four times a day (QID) | ORAL | 0 refills | Status: AC

## 2021-12-06 NOTE — Plan of Care (Signed)
  Problem: Education: Goal: Knowledge of General Education information will improve Description: Including pain rating scale, medication(s)/side effects and non-pharmacologic comfort measures Outcome: Progressing   Problem: Health Behavior/Discharge Planning: Goal: Ability to manage health-related needs will improve Outcome: Progressing   Problem: Activity: Goal: Risk for activity intolerance will decrease Outcome: Progressing   Problem: Elimination: Goal: Will not experience complications related to urinary retention Outcome: Progressing   Problem: Safety: Goal: Ability to remain free from injury will improve Outcome: Progressing

## 2021-12-06 NOTE — Progress Notes (Signed)
IV discontinue discharge paperwork explain and given to patient, states no questions or concerns at this time.  Patient awaiting on ride.

## 2021-12-06 NOTE — Discharge Summary (Signed)
Lauren Cross KGM:[id number] DOB: 04/01/1976 DOA: 12/03/2021  PCP: [name], MD  Admit date: 12/03/2021 Discharge date: 12/06/2021  Time spent: 35 minutes  Recommendations for Outpatient Follow-up:  Pcp f/u     Discharge Diagnoses:  Principal Problem:   Hypotension Active Problems:   Chest pain   Chronic hyponatremia   HTN (hypertension)   Seizure disorder (HCC)   Current smoker   Bipolar disorder ([place])   Cirrhosis of liver (HCC)   History of CVA (cerebrovascular accident)   Tobacco abuse   Dyslipidemia   Anxiety and depression   Type 2 diabetes mellitus with peripheral neuropathy (HCC)   GERD without esophagitis   C. difficile colitis   Discharge Condition: stable  Diet recommendation: carb modified  Filed Weights   12/03/21 1738  Weight: 61.2 kg    History of present illness:  From admission h and p Lauren Cross is a 46 y.o. Caucasian female with medical history significant for asthma, bipolar disorder, chronic back pain, depression, fibromyalgia, GERD, type 2 diabetes mellitus, ongoing tobacco abuse, peripheral neuropathy, seizure disorder, COPD and anxiety, who presented to the ER with acute onset of midsternal chest pain felt as pressure and graded 7/10 in severity with associated nausea without vomiting or diaphoresis.  She denied any cough or wheezing or hemoptysis.  No leg pain or edema or recent travels or surgeries.  She gets iron transfusion for anemia and her last transfusion was on 8/14.  She was seen by her neurologist today when she developed hypotension and tachycardia with associated chest pain.  She admitted to radiation to her left arm and it lasted about 45 minutes prior to arrival to the ED.  She was also complaining of lightheadedness and dizziness with associated headache.  No paresthesias or focal muscle weakness.  No dysuria, oliguria or hematuria or flank pain.  No other bleeding diathesis.  Hospital Course:  # Hypotension Etiology unclear.  Though on multiple meds that can contribute, though denies misuse. Did complain of chest pain though this is resolved and troponins negative. No sig rhythm disturbance on ekg. No reported seizure like activity and patient mentating clearly thus does not appear to be post-ictal. No vomiting or diarrhea or reduced po preceding admission to suggest volume loss, though did develops diarrhea while here and was diagnosed with c diff.. Hypotension has resolved with fluid resuscitation and BPs have remained normal off fluids. Orthostats negative. Duloxetine is relatively new and that particularly in combo with cariprazine could contribute. Patient feels well today, ambulated with PT/OT  without problem. Blood cultures ngtd. Will discharge home with c diff treatment and reduced dose duloxetine   # Diarrhea Several watery BMs per patient beginning day after admission, no abd pain or fever or leukocytosis or fever. C diff positive (antigen positive, toxin neg, pcr positive). Given diarrhea will treat as active infection (was treated w/ abx for sinus infection about a month ago), diarrhea has already improved to loose stool, patient requests discharge and this is reasonable given mild symptoms, though return precautions were reviewed. - 10 days oral vanc (will give enough to treat today and patient will pick up from her pharmacy tomorrow)   # Hypokalemia repleted   # Chest pain Resolved. No hx of CAD. Serial troponins and ekg negative - monitor tele   # History CVA Nothing acute seen on CT - cont home atorva, plavix   Procedures: none   Consultations: none  Discharge Exam: Vitals:   12/06/21 6440 12/06/21 3474  BP: 109/81 123/82  Pulse: 66 92  Resp: 16 17  Temp: 97.8 F (36.6 C) 97.7 F (36.5 C)  SpO2: 98% 100%    General: NAD Cardiovascular: RRR Respiratory: CTAB Abdomen: soft, non-tender  Discharge Instructions   Discharge Instructions     Diet Carb Modified   Complete by: As  directed    Increase activity slowly   Complete by: As directed       Allergies as of 12/06/2021       Reactions   Zetia [ezetimibe] Anaphylaxis   Corticosteroids    Other reaction(s): psychosis   Hydromorphone Other (See Comments)   aggitation   Levofloxacin Other (See Comments)   Moviprep [peg-kcl-nacl-nasulf-na Asc-c]    States caused her to "Choke"    Oxycodone Other (See Comments)   hallucinations   Tape Other (See Comments)   unknown   Codeine Nausea Only   aggitation   Venofer [iron Sucrose] Other (See Comments)   Muscle spasms and body aches        Medication List     STOP taking these medications    doxycycline 100 MG capsule Commonly known as: VIBRAMYCIN   famotidine 20 MG tablet Commonly known as: PEPCID   ibuprofen 600 MG tablet Commonly known as: ADVIL   ibuprofen 800 MG tablet Commonly known as: ADVIL   methocarbamol 750 MG tablet Commonly known as: ROBAXIN   omeprazole 10 MG capsule Commonly known as: PRILOSEC       TAKE these medications    albuterol 108 (90 Base) MCG/ACT inhaler Commonly known as: VENTOLIN HFA Inhale 1-2 puffs into the lungs every 6 (six) hours as needed for wheezing or shortness of breath.   alprazolam 2 MG tablet Commonly known as: XANAX 1 tablet Orally every 6 hours as needed for anxiety.   atorvastatin 80 MG tablet Commonly known as: LIPITOR Take 1 tablet by mouth daily.   azelastine 0.1 % nasal spray Commonly known as: ASTELIN 1 puff Nasally Twice a day for 30 day(s)   clopidogrel 75 MG tablet Commonly known as: PLAVIX Take 1 tablet by mouth daily.   cyclobenzaprine 10 MG tablet Commonly known as: FLEXERIL take 1 tablet by mouth three times a day if needed for muscle spasm   Dextran 70-Hypromellose 0.1-0.3 % Soln Apply 1 drop to eye every 4 (four) hours as needed (dry eyes).   DULoxetine 30 MG capsule Commonly known as: CYMBALTA Take 30 mg by mouth daily. What changed: Another medication with  the same name was removed. Continue taking this medication, and follow the directions you see here.   FeroSul 325 (65 FE) MG tablet Generic drug: ferrous sulfate Take 325 mg by mouth 2 (two) times daily.   Flonase Allergy Relief 50 MCG/ACT nasal spray Generic drug: fluticasone 1 spray in each nostril Nasally twice a day for 30 day(s)   gabapentin 600 MG tablet Commonly known as: NEURONTIN Take 600 mg by mouth 3 (three) times daily. What changed: Another medication with the same name was removed. Continue taking this medication, and follow the directions you see here.   HYDROcodone-acetaminophen 5-325 MG tablet Commonly known as: NORCO/VICODIN TK 1 T PO  Q 6 H PRN P CONTROL   levETIRAcetam 750 MG tablet Commonly known as: KEPPRA Take 1 tablet (750 mg total) by mouth 2 (two) times daily.   meloxicam 15 MG tablet Commonly known as: MOBIC Take 1 tablet every day by oral route as needed.   metFORMIN 500 MG 24 hr tablet  Commonly known as: GLUCOPHAGE-XR TAKE 2 TABLETS BY MOUTH ONCE DAILY WITH SUPPER Orally Once a day for 30 day(s)   montelukast 10 MG tablet Commonly known as: SINGULAIR 1 tablet in the evening Orally Once a day for 30 day(s)   nitroGLYCERIN 0.4 MG SL tablet Commonly known as: NITROSTAT Place 0.4 mg under the tongue every 5 (five) minutes as needed for chest pain.   ondansetron 4 MG disintegrating tablet Commonly known as: ZOFRAN-ODT Take 4 mg by mouth every 8 (eight) hours as needed for nausea or vomiting.   pantoprazole 40 MG tablet Commonly known as: PROTONIX TAKE (1) TABLET BY MOUTH TWICE A DAY BEFORE MEALS. (BREAKFAST AND SUPPER) What changed: See the new instructions.   Qnasl 80 MCG/ACT Aers Generic drug: Beclomethasone Dipropionate 2 sprays in each nostril Nasally Once a day for 30 day(s)   traMADol 50 MG tablet Commonly known as: ULTRAM Take 50 mg by mouth every 4 (four) hours as needed.   vancomycin 125 MG capsule Commonly known as:  VANCOCIN Take 1 capsule (125 mg total) by mouth 4 (four) times daily for 9 days. Start taking on: December 07, 2021   Vraylar 3 MG capsule Generic drug: cariprazine Take 3 mg by mouth daily.       Allergies  Allergen Reactions   Zetia [Ezetimibe] Anaphylaxis   Corticosteroids     Other reaction(s): psychosis   Hydromorphone Other (See Comments)    aggitation   Levofloxacin Other (See Comments)   Moviprep [Peg-Kcl-Nacl-Nasulf-Na Asc-C]     States caused her to "Choke"    Oxycodone Other (See Comments)    hallucinations   Tape Other (See Comments)    unknown   Codeine Nausea Only    aggitation   Venofer [Iron Sucrose] Other (See Comments)    Muscle spasms and body aches    Follow-up Information     [name], MD Follow up.   Specialty: Family Medicine Contact information: [address] [place] [address] [phone]                  The results of significant diagnostics from this hospitalization (including imaging, microbiology, ancillary and laboratory) are listed below for reference.    Significant Diagnostic Studies: CT Angio Chest/Abd/Pel for Dissection W and/or Wo Contrast  Result Date: 12/03/2021 CLINICAL DATA:  Chest pain, back pain EXAM: CT ANGIOGRAPHY CHEST, ABDOMEN AND PELVIS TECHNIQUE: Non-contrast CT of the chest was initially obtained. Multidetector CT imaging through the chest, abdomen and pelvis was performed using the standard protocol during bolus administration of intravenous contrast. Multiplanar reconstructed images and MIPs were obtained and reviewed to evaluate the vascular anatomy. RADIATION DOSE REDUCTION: This exam was performed according to the departmental dose-optimization program which includes automated exposure control, adjustment of the mA and/or kV according to patient size and/or use of iterative reconstruction technique. CONTRAST:  118m OMNIPAQUE IOHEXOL 350 MG/ML SOLN COMPARISON:  Previous studies including chest  radiograph done earlier today, CT abdomen done on 01/19/2018 FINDINGS: CTA CHEST FINDINGS Cardiovascular: There is no evidence of mural hematoma in thoracic aorta in the noncontrast images of chest. There is homogeneous enhancement in thoracic aorta. There is no demonstrable intimal flap in thoracic aorta. There are no filling defects in central pulmonary artery branches. Mediastinum/Nodes: There is no evidence of mediastinal hematoma. There is no significant lymphadenopathy. Lungs/Pleura: There is no focal pulmonary consolidation. There is no pleural effusion or pneumothorax. Musculoskeletal: No significant abnormalities are seen in bony structures in chest.  Review of the MIP images confirms the above findings. CTA ABDOMEN AND PELVIS FINDINGS VASCULAR Aorta: Atherosclerotic plaques and calcifications are seen in infrarenal aorta close to bifurcation with some narrowing of the lumen. There is no focal aneurysmal dilation. There is no demonstrable intimal flap. Celiac: Unremarkable. SMA: Unremarkable. Renals: Unremarkable. IMA: Patent. There is possible significant stenosis at the origin of IMA. Inflow: There is no significant narrowing of the iliacs. Scattered atherosclerotic plaques and calcifications are seen. Veins: Unremarkable. Review of the MIP images confirms the above findings. NON-VASCULAR Hepatobiliary: Liver measures 19.2 cm in length. There is fatty infiltration. Surgical clips are seen in gallbladder fossa. There is no significant dilation of bile ducts. Pancreas: No focal abnormalities are seen. Spleen: There are calcified granulomas in spleen. Adrenals/Urinary Tract: Adrenals are unremarkable. There is no hydronephrosis. There are no renal or ureteral stones. Urinary bladder is unremarkable. Stomach/Bowel: Stomach is unremarkable. Small bowel loops are unremarkable. Appendix is not seen. There is no pericecal inflammation. There is fluid in the lumen of distal small bowel loops and colon. There is no  significant wall thickening in colon. There is no pericolic stranding. Lymphatic: No significant lymphadenopathy seen. Reproductive: Uterus is to the left of midline. There are no adnexal masses. There is no free fluid in pelvis. Other: There is no ascites or pneumoperitoneum. Small umbilical hernia containing fat is seen. Musculoskeletal: Degenerative changes are noted in lower lumbar spine at the L4-L5 level with significant encroachment of neural foramina. There is minimal decrease in height of upper endplate of body of T3 vertebra with Schmorl's node, possibly residual from previous injury. Review of the MIP images confirms the above findings. IMPRESSION: There is no evidence of dissection in the thoracic and abdominal aorta. Major branches of thoracic and abdominal aorta are patent. Moderate to marked atherosclerotic plaques and calcifications are noted in the infrarenal abdominal aorta with narrowing of lumen. There is no focal aneurysmal dilation. 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 There is no evidence of central pulmonary artery embolism. There is no focal pulmonary consolidation. There is no evidence of intestinal obstruction or pneumoperitoneum. There is no hydronephrosis. Enlarged fatty liver. There is fluid in the lumen of distal small bowel loops and colon which may be a normal variation or suggest nonspecific enteritis. There is no significant bowel wall thickening. Other findings as described in the body of the report. Electronically Signed   By: Elmer Picker M.D.   On: 12/03/2021 20:07   CT Head Wo Contrast  Result Date: 12/03/2021 CLINICAL DATA:  Sudden severe headache EXAM: CT HEAD WITHOUT CONTRAST TECHNIQUE: Contiguous axial images were obtained from the base of the skull through the vertex without intravenous contrast. RADIATION DOSE REDUCTION: This exam was performed according to the departmental dose-optimization program which includes automated exposure control,  adjustment of the mA and/or kV according to patient size and/or use of iterative reconstruction technique. COMPARISON:  MR head 07/21/2021 and CT head 07/21/2021 FINDINGS: Brain: No intracranial hemorrhage, mass effect, or evidence of acute infarct. No hydrocephalus. No extra-axial fluid collection. Chronic infarcts left medial parietal lobe, and bilateral cerebellar hemispheres. Vascular: No hyperdense vessel or unexpected calcification. Skull: No fracture or focal lesion. Sinuses/Orbits: No acute finding. Paranasal sinuses and mastoid air cells are well aerated. Other: None. IMPRESSION: No acute intracranial abnormality. Chronic cerebellar and left parietal infarcts. Electronically Signed   By: Placido Sou M.D.   On: 12/03/2021 19:55   DG Chest Port 1 View  Result Date: 12/03/2021  CLINICAL DATA:  Hypotension and tachycardia EXAM: PORTABLE CHEST 1 VIEW COMPARISON:  10/05/2021 FINDINGS: Numerous leads and wires project over the chest. Midline trachea. Normal heart size and mediastinal contours. No pleural effusion or pneumothorax. Clear lungs. IMPRESSION: Normal chest. Electronically Signed   By: Abigail Miyamoto M.D.   On: 12/03/2021 18:31    Microbiology: Recent Results (from the past 240 hour(s))  Culture, blood (Routine X 2) w Reflex to ID Panel     Status: None (Preliminary result)   Collection Time: 12/04/21  7:20 PM   Specimen: BLOOD RIGHT HAND  Result Value Ref Range Status   Specimen Description BLOOD RIGHT HAND  Final   Special Requests   Final    BOTTLES DRAWN AEROBIC AND ANAEROBIC Blood Culture adequate volume   Culture   Final    NO GROWTH 2 DAYS Performed at [facility], [address], [place], [place] [address]    Report Status PENDING  Incomplete  Culture, blood (Routine X 2) w Reflex to ID Panel     Status: None (Preliminary result)   Collection Time: 12/04/21  7:32 PM   Specimen: BLOOD  Result Value Ref Range Status   Specimen Description BLOOD LEFT WRIST  Final    Special Requests   Final    BOTTLES DRAWN AEROBIC AND ANAEROBIC Blood Culture adequate volume   Culture   Final    NO GROWTH 2 DAYS Performed at [facility], [address], [place], [place] [address]    Report Status PENDING  Incomplete  C Difficile Quick Screen w PCR reflex     Status: Abnormal   Collection Time: 12/05/21  8:45 PM   Specimen: STOOL  Result Value Ref Range Status   C Diff antigen POSITIVE (A) NEGATIVE Final   C Diff toxin NEGATIVE NEGATIVE Final   C Diff interpretation Results are indeterminate. See PCR results.  Final    Comment: Performed at [facility], [address] [place], [place] [address]  Gastrointestinal Panel by PCR , Stool     Status: None   Collection Time: 12/05/21  8:45 PM   Specimen: Stool  Result Value Ref Range Status   Campylobacter species NOT DETECTED NOT DETECTED Final   Plesimonas shigelloides NOT DETECTED NOT DETECTED Final   Salmonella species NOT DETECTED NOT DETECTED Final   Yersinia enterocolitica NOT DETECTED NOT DETECTED Final   Vibrio species NOT DETECTED NOT DETECTED Final   Vibrio cholerae NOT DETECTED NOT DETECTED Final   Enteroaggregative E coli (EAEC) NOT DETECTED NOT DETECTED Final   Enteropathogenic E coli (EPEC) NOT DETECTED NOT DETECTED Final   Enterotoxigenic E coli (ETEC) NOT DETECTED NOT DETECTED Final   Shiga like toxin producing E coli (STEC) NOT DETECTED NOT DETECTED Final   Shigella/Enteroinvasive E coli (EIEC) NOT DETECTED NOT DETECTED Final   Cryptosporidium NOT DETECTED NOT DETECTED Final   Cyclospora cayetanensis NOT DETECTED NOT DETECTED Final   Entamoeba histolytica NOT DETECTED NOT DETECTED Final   Giardia lamblia NOT DETECTED NOT DETECTED Final   Adenovirus F40/41 NOT DETECTED NOT DETECTED Final   Astrovirus NOT DETECTED NOT DETECTED Final   Norovirus GI/GII NOT DETECTED NOT DETECTED Final   Rotavirus A NOT DETECTED NOT DETECTED Final   Sapovirus (I, II, IV, and V) NOT DETECTED  NOT DETECTED Final    Comment: Performed at [facility], [address] [place], [place] [address]  C. Diff by PCR, Reflexed     Status: Abnormal  Collection Time: 12/05/21  8:45 PM  Result Value Ref Range Status   Toxigenic C. Difficile by PCR POSITIVE (A) NEGATIVE Final    Comment: Positive for toxigenic C. difficile with little to no toxin production. Only treat if clinical presentation suggests symptomatic illness. Performed at [facility], [address] [place],  [address]      Labs: Basic Metabolic Panel: Recent Labs  Lab 12/03/21 1742 12/04/21 0411 12/05/21 0345 12/05/21 1450 12/06/21 0413  NA 140 143 142 140 140  K 2.9* 4.0 2.7* 4.0 3.4*  CL 106 118* 112* 110 109  CO2 25 21* '25 24 25  '$ GLUCOSE 84 89 103* 114* 96  BUN '8 7 7 7 6  '$ CREATININE 1.07* 1.05* 0.79 0.83 0.78  CALCIUM 9.0 8.1* 8.8* 9.2 8.9  MG 2.1  --  2.0  --   --    Liver Function Tests: Recent Labs  Lab 12/03/21 1742  AST 12*  ALT 11  ALKPHOS 87  BILITOT 0.5  PROT 7.3  ALBUMIN 3.8   Recent Labs  Lab 12/03/21 1742  LIPASE 24   No results for input(s): "AMMONIA" in the last 168 hours. CBC: Recent Labs  Lab 12/03/21 1742 12/04/21 0411 12/05/21 0345 12/06/21 0413  WBC 13.5* 11.6* 10.4 10.5  NEUTROABS 8.2*  --   --   --   HGB 12.5 11.0* 11.4* 12.7  HCT 40.4 35.8* 35.4* 38.4  MCV 93.3 94.0 89.6 88.7  PLT 358 312 315 326   Cardiac Enzymes: No results for input(s): "CKTOTAL", "CKMB", "CKMBINDEX", "TROPONINI" in the last 168 hours. BNP: BNP (last 3 results) No results for input(s): "BNP" in the last 8760 hours.  ProBNP (last 3 results) No results for input(s): "PROBNP" in the last 8760 hours.  CBG: Recent Labs  Lab 12/05/21 1127 12/05/21 1616 12/05/21 2109 12/06/21 0753 12/06/21 1129  GLUCAP 134* 128* 93 102* 145*       Signed:  Desma Maxim MD.  [facility] 12/06/2021, 11:33 AM

## 2021-12-06 NOTE — TOC Transition Note (Signed)
Transition of Care [facility]) - CM/SW Discharge Note   Patient Details  Name: Lauren Cross MRN: [id number] Date of Birth: [birth year]  [facility] CM/SW Contact:  Izola Oldenburg, RN Phone Number: 12/06/2021, 2:16 PM   Clinical Narrative:  8/20: patient discharged today. Spoke with patient and no issues with transportation or obtaining medications. Lives with mother and daughter.30-say Readmission prevention screen completed.   RX: [place], [place] PCP: Dr Bartolo Darter.  Simmie Davies RN CM    Final next level of care: Home/Self Care Barriers to Discharge: Barriers Resolved   Patient Goals and CMS Choice        Discharge Placement                       Discharge Plan and Services                DME Arranged: N/A DME Agency: NA       HH Arranged: NA HH Agency: NA        Social Determinants of Health (SDOH) Interventions     Readmission Risk Interventions    12/06/2021   11:52 AM  Readmission Risk Prevention Plan  Transportation Screening Complete  PCP or Specialist Appt within 5-7 Days Complete  Home Care Screening Complete  Medication Review (RN CM) Referral to Pharmacy

## 2021-12-07 ENCOUNTER — Inpatient Hospital Stay: Payer: Medicaid Other

## 2021-12-07 ENCOUNTER — Ambulatory Visit: Payer: Medicaid Other | Admitting: Gastroenterology

## 2021-12-07 ENCOUNTER — Ambulatory Visit: Payer: Medicaid Other

## 2021-12-09 LAB — CULTURE, BLOOD (ROUTINE X 2)
Culture: NO GROWTH
Culture: NO GROWTH
Special Requests: ADEQUATE
Special Requests: ADEQUATE

## 2021-12-14 ENCOUNTER — Inpatient Hospital Stay: Payer: Medicaid Other

## 2021-12-24 ENCOUNTER — Other Ambulatory Visit: Payer: Medicaid Other

## 2021-12-24 ENCOUNTER — Ambulatory Visit: Payer: Medicaid Other | Admitting: Physician Assistant

## 2021-12-30 ENCOUNTER — Ambulatory Visit: Payer: Medicaid Other | Admitting: Gastroenterology

## 2022-01-08 ENCOUNTER — Encounter (HOSPITAL_COMMUNITY): Payer: Self-pay

## 2022-01-08 ENCOUNTER — Inpatient Hospital Stay: Payer: Medicaid Other | Attending: Physician Assistant

## 2022-01-08 ENCOUNTER — Ambulatory Visit (HOSPITAL_COMMUNITY)
Admission: RE | Admit: 2022-01-08 | Discharge: 2022-01-08 | Disposition: A | Discharge status: Home / Self Care | Payer: Medicaid Other | Source: Ambulatory Visit | Attending: Physician Assistant | Admitting: Physician Assistant

## 2022-01-08 DIAGNOSIS — R634 Abnormal weight loss: Secondary | ICD-10-CM

## 2022-01-08 LAB — POCT I-STAT CREATININE: Creatinine, Ser: 1 mg/dL (ref 0.44–1.00)

## 2022-01-08 MED ORDER — IOHEXOL 300 MG/ML  SOLN
100.0000 mL | Freq: Once | INTRAMUSCULAR | Status: AC | PRN
  Administered 2022-01-08: 100 mL via INTRAVENOUS

## 2022-01-14 ENCOUNTER — Ambulatory Visit: Payer: Medicaid Other | Admitting: Physician Assistant

## 2022-01-21 ENCOUNTER — Inpatient Hospital Stay: Payer: Medicaid Other | Admitting: Physician Assistant

## 2022-01-21 ENCOUNTER — Other Ambulatory Visit: Payer: Self-pay | Admitting: Physician Assistant

## 2022-01-21 ENCOUNTER — Ambulatory Visit: Payer: Medicaid Other | Admitting: Gastroenterology

## 2022-01-21 ENCOUNTER — Inpatient Hospital Stay: Payer: Medicaid Other | Attending: Physician Assistant

## 2022-01-21 DIAGNOSIS — I771 Stricture of artery: Secondary | ICD-10-CM | POA: Diagnosis not present

## 2022-01-21 DIAGNOSIS — E559 Vitamin D deficiency, unspecified: Secondary | ICD-10-CM | POA: Diagnosis not present

## 2022-01-21 DIAGNOSIS — Z7902 Long term (current) use of antithrombotics/antiplatelets: Secondary | ICD-10-CM | POA: Insufficient documentation

## 2022-01-21 DIAGNOSIS — M797 Fibromyalgia: Secondary | ICD-10-CM | POA: Diagnosis not present

## 2022-01-21 DIAGNOSIS — Z87891 Personal history of nicotine dependence: Secondary | ICD-10-CM | POA: Insufficient documentation

## 2022-01-21 DIAGNOSIS — D509 Iron deficiency anemia, unspecified: Secondary | ICD-10-CM | POA: Diagnosis present

## 2022-01-21 DIAGNOSIS — E785 Hyperlipidemia, unspecified: Secondary | ICD-10-CM | POA: Insufficient documentation

## 2022-01-21 DIAGNOSIS — R5383 Other fatigue: Secondary | ICD-10-CM | POA: Diagnosis not present

## 2022-01-21 DIAGNOSIS — K219 Gastro-esophageal reflux disease without esophagitis: Secondary | ICD-10-CM | POA: Insufficient documentation

## 2022-01-21 DIAGNOSIS — Z8541 Personal history of malignant neoplasm of cervix uteri: Secondary | ICD-10-CM | POA: Insufficient documentation

## 2022-01-21 DIAGNOSIS — Z803 Family history of malignant neoplasm of breast: Secondary | ICD-10-CM | POA: Diagnosis not present

## 2022-01-21 DIAGNOSIS — Z8673 Personal history of transient ischemic attack (TIA), and cerebral infarction without residual deficits: Secondary | ICD-10-CM | POA: Diagnosis not present

## 2022-01-21 DIAGNOSIS — Z791 Long term (current) use of non-steroidal anti-inflammatories (NSAID): Secondary | ICD-10-CM | POA: Insufficient documentation

## 2022-01-21 DIAGNOSIS — K5900 Constipation, unspecified: Secondary | ICD-10-CM | POA: Diagnosis not present

## 2022-01-21 DIAGNOSIS — R634 Abnormal weight loss: Secondary | ICD-10-CM | POA: Insufficient documentation

## 2022-01-21 DIAGNOSIS — R197 Diarrhea, unspecified: Secondary | ICD-10-CM | POA: Diagnosis not present

## 2022-01-21 DIAGNOSIS — R42 Dizziness and giddiness: Secondary | ICD-10-CM | POA: Insufficient documentation

## 2022-01-21 DIAGNOSIS — D72829 Elevated white blood cell count, unspecified: Secondary | ICD-10-CM

## 2022-01-21 DIAGNOSIS — D75839 Thrombocytosis, unspecified: Secondary | ICD-10-CM | POA: Diagnosis not present

## 2022-01-21 DIAGNOSIS — F17210 Nicotine dependence, cigarettes, uncomplicated: Secondary | ICD-10-CM | POA: Insufficient documentation

## 2022-01-21 DIAGNOSIS — I252 Old myocardial infarction: Secondary | ICD-10-CM | POA: Insufficient documentation

## 2022-01-21 DIAGNOSIS — K760 Fatty (change of) liver, not elsewhere classified: Secondary | ICD-10-CM | POA: Insufficient documentation

## 2022-01-21 DIAGNOSIS — D508 Other iron deficiency anemias: Secondary | ICD-10-CM

## 2022-01-21 DIAGNOSIS — Z7984 Long term (current) use of oral hypoglycemic drugs: Secondary | ICD-10-CM | POA: Diagnosis not present

## 2022-01-21 DIAGNOSIS — Z79899 Other long term (current) drug therapy: Secondary | ICD-10-CM | POA: Insufficient documentation

## 2022-01-21 DIAGNOSIS — H9312 Tinnitus, left ear: Secondary | ICD-10-CM | POA: Diagnosis not present

## 2022-01-21 LAB — CBC WITH DIFFERENTIAL/PLATELET
Abs Immature Granulocytes: 0 10*3/uL (ref 0.00–0.07)
Basophils Absolute: 0 10*3/uL (ref 0.0–0.1)
Basophils Relative: 0 %
Eosinophils Absolute: 0.4 10*3/uL (ref 0.0–0.5)
Eosinophils Relative: 4 %
HCT: 46 % (ref 36.0–46.0)
Hemoglobin: 14.6 g/dL (ref 12.0–15.0)
Lymphocytes Relative: 42 %
Lymphs Abs: 4.3 10*3/uL — ABNORMAL HIGH (ref 0.7–4.0)
MCH: 30.4 pg (ref 26.0–34.0)
MCHC: 31.7 g/dL (ref 30.0–36.0)
MCV: 95.8 fL (ref 80.0–100.0)
Monocytes Absolute: 0.4 10*3/uL (ref 0.1–1.0)
Monocytes Relative: 4 %
Neutro Abs: 5.1 10*3/uL (ref 1.7–7.7)
Neutrophils Relative %: 50 %
Platelets: 357 10*3/uL (ref 150–400)
RBC: 4.8 MIL/uL (ref 3.87–5.11)
RDW: 13.3 % (ref 11.5–15.5)
WBC Morphology: ABNORMAL
WBC: 10.2 10*3/uL (ref 4.0–10.5)
nRBC: 0 % (ref 0.0–0.2)

## 2022-01-21 LAB — FERRITIN: Ferritin: 61 ng/mL (ref 11–307)

## 2022-01-21 LAB — COMPREHENSIVE METABOLIC PANEL
ALT: 24 U/L (ref 0–44)
AST: 25 U/L (ref 15–41)
Albumin: 4 g/dL (ref 3.5–5.0)
Alkaline Phosphatase: 99 U/L (ref 38–126)
Anion gap: 9 (ref 5–15)
BUN: 11 mg/dL (ref 6–20)
CO2: 27 mmol/L (ref 22–32)
Calcium: 9.7 mg/dL (ref 8.9–10.3)
Chloride: 105 mmol/L (ref 98–111)
Creatinine, Ser: 1.05 mg/dL — ABNORMAL HIGH (ref 0.44–1.00)
GFR, Estimated: >60 mL/min (ref >60)
Glucose, Bld: 121 mg/dL — ABNORMAL HIGH (ref 70–99)
Potassium: 3.5 mmol/L (ref 3.5–5.1)
Sodium: 141 mmol/L (ref 135–145)
Total Bilirubin: 0.4 mg/dL (ref 0.3–1.2)
Total Protein: 7.7 g/dL (ref 6.5–8.1)

## 2022-01-21 LAB — IRON AND TIBC
Iron: 62 ug/dL (ref 28–170)
Saturation Ratios: 17 % (ref 10.4–31.8)
TIBC: 356 ug/dL (ref 250–450)
UIBC: 294 ug/dL

## 2022-01-21 NOTE — Progress Notes (Deleted)
RESCHEDULE - did not get labs
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
## 2022-01-25 LAB — BCR-ABL1 FISH
Cells Analyzed: 200
Cells Counted: 200

## 2022-01-27 ENCOUNTER — Encounter: Payer: Self-pay | Admitting: Hematology

## 2022-01-29 LAB — JAK2 V617F RFX CALR/MPL/E12-15

## 2022-01-29 LAB — CALR +MPL + E12-E15  (REFLEX)
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
## 2022-02-01 ENCOUNTER — Inpatient Hospital Stay (HOSPITAL_BASED_OUTPATIENT_CLINIC_OR_DEPARTMENT_OTHER): Payer: Medicaid Other | Admitting: Physician Assistant

## 2022-02-01 ENCOUNTER — Encounter: Payer: Self-pay | Admitting: Physician Assistant

## 2022-02-01 VITALS — BP 107/74 | HR 100 | Temp 97.3°F | Resp 19 | Ht 65.0 in | Wt 133.0 lb

## 2022-02-01 DIAGNOSIS — G458 Other transient cerebral ischemic attacks and related syndromes: Secondary | ICD-10-CM | POA: Diagnosis not present

## 2022-02-01 DIAGNOSIS — D509 Iron deficiency anemia, unspecified: Secondary | ICD-10-CM | POA: Diagnosis not present

## 2022-02-01 MED ORDER — FEROSUL 325 (65 FE) MG PO TABS: 325.0000 mg | ORAL_TABLET | Freq: Every day | ORAL | 3 refills | Status: DC

## 2022-02-01 NOTE — Patient Instructions (Signed)
[facility] **   You were seen today by Tarri Abernethy PA-C for your follow-up visit.    IRON DEFICIENCY ANEMIA Your blood levels look much better! Your iron levels are still somewhat low. We will schedule you for IV iron (Feraheme) x1 dose. I would also like you to continue taking iron tablet ONCE daily.  Take this along with a stool softener or fiber supplement (such as Metamucil or Benefiber) to decrease constipation from the iron tablet. It is also EXTREMELY IMPORTANT that you schedule a follow-up visit with the GI doctor so that they can investigate if you are losing any blood in your bowel movements. DR. Jenetta Downer & Scherrie Gerlach NP [phone]  ELEVATED WHITE BLOOD CELLS & PLATELETS The labs that we checked did not show any sign of genetic mutation or blood cancer. Your elevated white blood cells and platelets are most likely related to inflammation caused by your smoking.  ARTERY BLOCKAGE & LOW BLOOD PRESSURE The CT scan that we checked showed blockage in the artery that provides blood to your left arm. This is most likely why your left arm blood pressure is so much lower than your right arm. Referral has been sent for you to see a vascular surgeon.  Please call his office to make an appointment as soon as possible. DR. Sherren Mocha EARLY [phone]  FOLLOW-UP APPOINTMENT: Repeat labs in 3 months with office visit 1 week after  ** Thank you for trusting me with your healthcare!  I strive to provide all of my patients with quality care at each visit.  If you receive a survey for this visit, I would be so grateful to you for taking the time to provide feedback.  Thank you in advance!  ~ Jakhari Space                   Dr. Derek Jack   &   Tarri Abernethy, PA-C   - - - - - - - - - - - - - - - - - -    Thank you for choosing [facility] [place] at [facility] to provide your  oncology and hematology care.  To afford each patient quality time with our provider, please arrive at least 15 minutes before your scheduled appointment time.   If you have a lab appointment with the [facility] please come in thru the Main Entrance and check in at the main information desk.  You need to re-schedule your appointment should you arrive 10 or more minutes late.  We strive to give you quality time with our providers, and arriving late affects you and other patients whose appointments are after yours.  Also, if you no show three or more times for appointments you may be dismissed from the clinic at the providers discretion.     Again, thank you for choosing [facility].  Our hope is that these requests will decrease the amount of time that you wait before being seen by our physicians.       _____________________________________________________________  Should you have questions after your visit to [facility], please contact our office at [phone] and follow the prompts.  Our office hours are 8:00 a.m. and 4:30 p.m. Monday - Friday.  Please note that voicemails left after 4:00 p.m. may not be returned until the following business day.  We are closed weekends and major holidays.  You do  have access to a nurse 24-7, just call the main number to the clinic [phone] and do not press any options, hold on the line and a nurse will answer the phone.    For prescription refill requests, have your pharmacy contact our office and allow 72 hours.

## 2022-02-02 ENCOUNTER — Encounter: Payer: Self-pay | Admitting: Hematology

## 2022-02-05 ENCOUNTER — Inpatient Hospital Stay: Payer: Medicaid Other

## 2022-02-05 VITALS — BP 94/73 | HR 78 | Temp 97.9°F | Resp 18

## 2022-02-05 DIAGNOSIS — D509 Iron deficiency anemia, unspecified: Secondary | ICD-10-CM

## 2022-02-05 DIAGNOSIS — D649 Anemia, unspecified: Secondary | ICD-10-CM

## 2022-02-05 MED ORDER — ACETAMINOPHEN 325 MG PO TABS
650.0000 mg | ORAL_TABLET | Freq: Once | ORAL | Status: AC
  Administered 2022-02-05: 650 mg via ORAL
  Filled 2022-02-05: qty 2

## 2022-02-05 MED ORDER — FAMOTIDINE IN NACL 20-0.9 MG/50ML-% IV SOLN
20.0000 mg | Freq: Once | INTRAVENOUS | Status: AC
  Administered 2022-02-05: 20 mg via INTRAVENOUS
  Filled 2022-02-05: qty 50

## 2022-02-05 MED ORDER — SODIUM CHLORIDE 0.9 % IV SOLN: Freq: Once | INTRAVENOUS | Status: AC

## 2022-02-05 MED ORDER — SODIUM CHLORIDE 0.9 % IV SOLN
510.0000 mg | Freq: Once | INTRAVENOUS | Status: AC
  Administered 2022-02-05: 510 mg via INTRAVENOUS
  Filled 2022-02-05: qty 510

## 2022-02-05 MED ORDER — SODIUM CHLORIDE 0.9 % IV SOLN: INTRAVENOUS | Status: DC

## 2022-02-05 MED ORDER — METHYLPREDNISOLONE SODIUM SUCC 125 MG IJ SOLR
125.0000 mg | Freq: Once | INTRAMUSCULAR | Status: AC
  Administered 2022-02-05: 125 mg via INTRAVENOUS
  Filled 2022-02-05: qty 2

## 2022-02-05 NOTE — Progress Notes (Signed)
Patient presents today for Feraheme, BP 81/59, Dr. Lindi Adie made aware, patient okay to treat with additional orders received for 556m of NS. Patient reports taking Claritin at home. Patient tolerated iron and hydration infusion with no complaints voiced. Peripheral IV site clean and dry with good blood return noted before and after infusion. Band aid applied. Patient educated on medications that may contribute to hypotension. VSS with discharge and left in satisfactory condition with no s/s of distress noted.

## 2022-02-05 NOTE — Patient Instructions (Signed)
Lauren Cross  Discharge Instructions: Thank you for choosing [facility] to provide your oncology and hematology care.  If you have a lab appointment with the [facility], please come in thru the Main Entrance and check in at the main information desk.  Wear comfortable clothing and clothing appropriate for easy access to any Portacath or PICC line.   We strive to give you quality time with your provider. You may need to reschedule your appointment if you arrive late (15 or more minutes).  Arriving late affects you and other patients whose appointments are after yours.  Also, if you miss three or more appointments without notifying the office, you may be dismissed from the clinic at the provider's discretion.      For prescription refill requests, have your pharmacy contact our office and allow 72 hours for refills to be completed.    Today you received the following Feraheme, and 515m of NS, return as scheduled.   To help prevent nausea and vomiting after your treatment, we encourage you to take your nausea medication as directed.  BELOW ARE SYMPTOMS THAT SHOULD BE REPORTED IMMEDIATELY: *FEVER GREATER THAN 100.4 F (38 C) OR HIGHER *CHILLS OR SWEATING *NAUSEA AND VOMITING THAT IS NOT CONTROLLED WITH YOUR NAUSEA MEDICATION *UNUSUAL SHORTNESS OF BREATH *UNUSUAL BRUISING OR BLEEDING *URINARY PROBLEMS (pain or burning when urinating, or frequent urination) *BOWEL PROBLEMS (unusual diarrhea, constipation, pain near the anus) TENDERNESS IN MOUTH AND THROAT WITH OR WITHOUT PRESENCE OF ULCERS (sore throat, sores in mouth, or a toothache) UNUSUAL RASH, SWELLING OR PAIN  UNUSUAL VAGINAL DISCHARGE OR ITCHING   Items with * indicate a potential emergency and should be followed up as soon as possible or go to the Emergency Department if any problems should occur.  Please show the CHEMOTHERAPY ALERT CARD or IMMUNOTHERAPY ALERT CARD at check-in to the Emergency  Department and triage nurse.  Should you have questions after your visit or need to cancel or reschedule your appointment, please contact [phone] and follow the prompts.  Office hours are 8:00 a.m. to 4:30 p.m. Monday - Friday. Please note that voicemails left after 4:00 p.m. may not be returned until the following business day.  We are closed weekends and major holidays. You have access to a nurse at all times for urgent questions. Please call the main number to the clinic [phone] follow the prompts.  For any non-urgent questions, you may also contact your provider using MyChart. We now offer e-Visits for anyone 145and older to request care online for non-urgent symptoms. For details visit [email or web address]   Also download the MyChart app! Go to the app store, search "MyChart", open the app, select [facility], and log in with your MyChart username and password.  Masks are optional in the cancer centers. If you would like for your care team to wear a mask while they are taking care of you, please let them know. You may have one support person who is at least 48years old accompany you for your appointments.

## 2022-02-17 ENCOUNTER — Ambulatory Visit: Payer: Medicaid Other | Admitting: Vascular Surgery

## 2022-02-17 ENCOUNTER — Other Ambulatory Visit: Payer: Self-pay | Admitting: *Deleted

## 2022-02-17 DIAGNOSIS — I6523 Occlusion and stenosis of bilateral carotid arteries: Secondary | ICD-10-CM

## 2022-03-17 ENCOUNTER — Ambulatory Visit (INDEPENDENT_AMBULATORY_CARE_PROVIDER_SITE_OTHER): Payer: Medicaid Other

## 2022-03-17 ENCOUNTER — Encounter: Payer: Self-pay | Admitting: Vascular Surgery

## 2022-03-17 ENCOUNTER — Ambulatory Visit: Payer: Medicaid Other | Admitting: Vascular Surgery

## 2022-03-17 ENCOUNTER — Ambulatory Visit (INDEPENDENT_AMBULATORY_CARE_PROVIDER_SITE_OTHER): Payer: Medicaid Other | Admitting: Vascular Surgery

## 2022-03-17 VITALS — BP 100/69 | HR 84 | Temp 98.2°F | Ht 65.0 in | Wt 134.0 lb

## 2022-03-17 DIAGNOSIS — I6523 Occlusion and stenosis of bilateral carotid arteries: Secondary | ICD-10-CM

## 2022-03-17 DIAGNOSIS — G458 Other transient cerebral ischemic attacks and related syndromes: Secondary | ICD-10-CM | POA: Diagnosis not present
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
## 2022-03-31 ENCOUNTER — Ambulatory Visit: Payer: Medicaid Other | Admitting: Vascular Surgery

## 2022-04-08 ENCOUNTER — Other Ambulatory Visit: Payer: Self-pay

## 2022-04-08 DIAGNOSIS — I6523 Occlusion and stenosis of bilateral carotid arteries: Secondary | ICD-10-CM

## 2022-04-16 ENCOUNTER — Ambulatory Visit (HOSPITAL_COMMUNITY)
Admission: RE | Admit: 2022-04-16 | Discharge: 2022-04-16 | Disposition: A | Discharge status: Home / Self Care | Payer: Medicaid Other | Source: Ambulatory Visit | Attending: Vascular Surgery | Admitting: Vascular Surgery

## 2022-04-16 DIAGNOSIS — I6523 Occlusion and stenosis of bilateral carotid arteries: Secondary | ICD-10-CM | POA: Insufficient documentation

## 2022-04-16 LAB — POCT I-STAT CREATININE: Creatinine, Ser: 1.2 mg/dL — ABNORMAL HIGH (ref 0.44–1.00)

## 2022-04-16 MED ORDER — IOHEXOL 350 MG/ML SOLN
100.0000 mL | Freq: Once | INTRAVENOUS | Status: AC | PRN
  Administered 2022-04-16: 100 mL via INTRAVENOUS

## 2022-04-21 ENCOUNTER — Ambulatory Visit (INDEPENDENT_AMBULATORY_CARE_PROVIDER_SITE_OTHER): Payer: Medicaid Other | Admitting: Vascular Surgery

## 2022-04-21 ENCOUNTER — Encounter: Payer: Self-pay | Admitting: Vascular Surgery

## 2022-04-21 VITALS — BP 100/69 | HR 86 | Temp 97.9°F | Ht 65.0 in | Wt 137.6 lb

## 2022-04-21 DIAGNOSIS — G458 Other transient cerebral ischemic attacks and related syndromes: Secondary | ICD-10-CM | POA: Diagnosis not present
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
## 2022-04-27 ENCOUNTER — Telehealth: Payer: Self-pay | Admitting: Vascular Surgery

## 2022-04-27 NOTE — Telephone Encounter (Signed)
Georgina Peer, Lilla Shook, RN Called but went straight to VM       Previous Messages    ----- Message ----- From: Nicholas Lose, RN Sent: 04/23/2022  11:48 AM EST To: [facility] Admin Pool Subject: [facility] appt                                      Hey, you might be already working on this, but I just wanted to make sure you seen the second part of this about her needing an appt w/[facility].  Thanks, Herma Ard ----- Message ----- From: Rosetta Posner, MD Sent: 04/21/2022   4:56 PM EST To: Marvia Pickles; [facility] Admin Pool; *   Ms. Delangel needs to be scheduled for a left carotid subclavian bypass with Dr. Donzetta Matters.  She needs an office visit in [place] with Dr. Donzetta Matters prior to surgery

## 2022-04-27 NOTE — Telephone Encounter (Signed)
RE: [facility] appt Received: Today Lopez-Alvarez, Hillard Danker, Lilla Shook, RN I have called her again and left another VM       Previous Messages    ----- Message ----- From: Nicholas Lose, RN Sent: 04/23/2022  11:48 AM EST To: [facility] Admin Pool Subject: [facility] appt                                      Hey, you might be already working on this, but I just wanted to make sure you seen the second part of this about her needing an appt w/[facility].  Thanks, Herma Ard ----- Message ----- From: Rosetta Posner, MD Sent: 04/21/2022   4:56 PM EST To: Marvia Pickles; [facility] Admin Pool; *   Ms. Espinoza needs to be scheduled for a left carotid subclavian bypass with Dr. Donzetta Matters.  She needs an office visit in [place] with Dr. Donzetta Matters prior to surgery

## 2022-05-04 ENCOUNTER — Inpatient Hospital Stay: Payer: Medicaid Other | Attending: Physician Assistant

## 2022-05-11 ENCOUNTER — Ambulatory Visit: Payer: Medicaid Other | Admitting: Physician Assistant

## 2022-05-19 ENCOUNTER — Ambulatory Visit: Payer: Medicaid Other | Admitting: Vascular Surgery

## 2022-05-20 DEATH — deceased

## 2022-06-02 ENCOUNTER — Other Ambulatory Visit: Payer: Self-pay | Admitting: *Deleted

## 2022-06-02 DIAGNOSIS — D509 Iron deficiency anemia, unspecified: Secondary | ICD-10-CM

## 2022-06-02 MED ORDER — FEROSUL 325 (65 FE) MG PO TABS: 325.0000 mg | ORAL_TABLET | Freq: Every day | ORAL | 3 refills | Status: DC
